# Patient Record
Sex: Female | Born: 1950 | Race: White | Hispanic: No | Smoking: Never smoker
Health system: Southern US, Community
[De-identification: ages and names within clinical notes are randomized; demographics above are authoritative.]

## PROBLEM LIST (undated history)

## (undated) DIAGNOSIS — F32A Depression, unspecified: Secondary | ICD-10-CM

## (undated) DIAGNOSIS — Z9889 Other specified postprocedural states: Secondary | ICD-10-CM

## (undated) DIAGNOSIS — M1990 Unspecified osteoarthritis, unspecified site: Secondary | ICD-10-CM

## (undated) DIAGNOSIS — T7840XA Allergy, unspecified, initial encounter: Secondary | ICD-10-CM

## (undated) DIAGNOSIS — F419 Anxiety disorder, unspecified: Secondary | ICD-10-CM

## (undated) DIAGNOSIS — K219 Gastro-esophageal reflux disease without esophagitis: Secondary | ICD-10-CM

## (undated) DIAGNOSIS — F329 Major depressive disorder, single episode, unspecified: Secondary | ICD-10-CM

## (undated) DIAGNOSIS — E785 Hyperlipidemia, unspecified: Secondary | ICD-10-CM

## (undated) DIAGNOSIS — I10 Essential (primary) hypertension: Secondary | ICD-10-CM

## (undated) DIAGNOSIS — K5730 Diverticulosis of large intestine without perforation or abscess without bleeding: Secondary | ICD-10-CM

## (undated) HISTORY — PX: COLONOSCOPY: SHX174

## (undated) HISTORY — DX: Hyperlipidemia, unspecified: E78.5

## (undated) HISTORY — DX: Gastro-esophageal reflux disease without esophagitis: K21.9

## (undated) HISTORY — DX: Essential (primary) hypertension: I10

## (undated) HISTORY — DX: Depression, unspecified: F32.A

## (undated) HISTORY — DX: Major depressive disorder, single episode, unspecified: F32.9

## (undated) HISTORY — DX: Diverticulosis of large intestine without perforation or abscess without bleeding: K57.30

## (undated) HISTORY — DX: Allergy, unspecified, initial encounter: T78.40XA

## (undated) HISTORY — PX: BREAST SURGERY: SHX581

## (undated) HISTORY — DX: Other specified postprocedural states: Z98.890

## (undated) HISTORY — DX: Unspecified osteoarthritis, unspecified site: M19.90

---

## 2003-11-11 ENCOUNTER — Ambulatory Visit: Payer: Self-pay | Admitting: Obstetrics and Gynecology

## 2009-01-30 LAB — HM COLONOSCOPY: HM Colonoscopy: NORMAL

## 2010-10-22 ENCOUNTER — Ambulatory Visit (INDEPENDENT_AMBULATORY_CARE_PROVIDER_SITE_OTHER): Payer: BC Managed Care – PPO | Admitting: Internal Medicine

## 2010-10-22 ENCOUNTER — Encounter: Payer: Self-pay | Admitting: Internal Medicine

## 2010-10-22 VITALS — BP 150/92 | HR 100 | Temp 98.7°F | Resp 18 | Ht 63.0 in | Wt 229.8 lb

## 2010-10-22 DIAGNOSIS — J320 Chronic maxillary sinusitis: Secondary | ICD-10-CM

## 2010-10-22 DIAGNOSIS — K219 Gastro-esophageal reflux disease without esophagitis: Secondary | ICD-10-CM | POA: Insufficient documentation

## 2010-10-22 DIAGNOSIS — Z9889 Other specified postprocedural states: Secondary | ICD-10-CM | POA: Insufficient documentation

## 2010-10-22 DIAGNOSIS — K5730 Diverticulosis of large intestine without perforation or abscess without bleeding: Secondary | ICD-10-CM | POA: Insufficient documentation

## 2010-10-22 DIAGNOSIS — F324 Major depressive disorder, single episode, in partial remission: Secondary | ICD-10-CM | POA: Insufficient documentation

## 2010-10-22 DIAGNOSIS — F329 Major depressive disorder, single episode, unspecified: Secondary | ICD-10-CM

## 2010-10-22 DIAGNOSIS — I10 Essential (primary) hypertension: Secondary | ICD-10-CM | POA: Insufficient documentation

## 2010-10-22 DIAGNOSIS — E669 Obesity, unspecified: Secondary | ICD-10-CM

## 2010-10-22 LAB — LDL CHOLESTEROL, DIRECT: Direct LDL: 174 mg/dL

## 2010-10-22 LAB — COMPREHENSIVE METABOLIC PANEL
Alkaline Phosphatase: 64 U/L (ref 39–117)
BUN: 18 mg/dL (ref 6–23)
CO2: 28 mEq/L (ref 19–32)
Creatinine, Ser: 1.2 mg/dL (ref 0.4–1.2)
GFR: 50.8 mL/min — ABNORMAL LOW (ref >60.00)
Glucose, Bld: 105 mg/dL — ABNORMAL HIGH (ref 70–99)
Sodium: 136 mEq/L (ref 135–145)
Total Bilirubin: 0.8 mg/dL (ref 0.3–1.2)
Total Protein: 7.9 g/dL (ref 6.0–8.3)

## 2010-10-22 LAB — TSH: TSH: 0.45 u[IU]/mL (ref 0.35–5.50)

## 2010-10-22 LAB — LIPID PANEL
Cholesterol: 236 mg/dL — ABNORMAL HIGH (ref 0–200)
HDL: 45.8 mg/dL (ref >39.00)
Triglycerides: 92 mg/dL (ref 0.0–149.0)

## 2010-10-22 MED ORDER — HYDROCHLOROTHIAZIDE 25 MG PO TABS: 25.0000 mg | ORAL_TABLET | Freq: Every day | ORAL | Status: DC

## 2010-10-22 MED ORDER — LOSARTAN POTASSIUM 50 MG PO TABS: 50.0000 mg | ORAL_TABLET | Freq: Every day | ORAL | Status: DC

## 2010-10-22 MED ORDER — CLINDAMYCIN HCL 300 MG PO CAPS: 300.0000 mg | ORAL_CAPSULE | Freq: Three times a day (TID) | ORAL | Status: AC

## 2010-10-22 MED ORDER — AZITHROMYCIN 500 MG PO TABS: ORAL_TABLET | ORAL | Status: DC

## 2010-10-22 NOTE — Progress Notes (Signed)
  Subjective:    Patient ID: Michele Garza, female    DOB: 13-Apr-1950, 60 y.o.   MRN: [id number]  HPI Michele Garza is a 60 yo female with no significant PMH who is transferring care from Dr. Quillian Quince.  She had a complicated sinus infection in August which was  treated by Urgent care with amoxicillin when she couldn't get a call back for an appointment with her PCP.  Des[pite finishing her antibioitcis she continues to have green nasal drainage, however her fevers finally resolved. She contineus to report fullness on the right side and tooth pain. Her 2nd CC complaint is obesity,  She wants to lose weight and get healthy "the old fashioned way" using diet and exercise.   Past Medical History  Diagnosis Date  . Hypertension   . History of colonoscopy     done in  dec 2011, repeat due 5 yrs due to FH  . Diverticulosis of colon     one prior episode July 2011 caused by tomato overingestion  . GERD (gastroesophageal reflux disease)   . Depression    No current outpatient prescriptions on file prior to visit.     Review of Systems  Constitutional: Negative for fever, chills and unexpected weight change.  HENT: Positive for postnasal drip and sinus pressure. Negative for hearing loss, ear pain, nosebleeds, congestion, sore throat, facial swelling, rhinorrhea, sneezing, mouth sores, trouble swallowing, neck pain, neck stiffness, voice change, tinnitus and ear discharge.   Eyes: Negative for pain, discharge, redness and visual disturbance.  Respiratory: Negative for cough, chest tightness, shortness of breath, wheezing and stridor.   Cardiovascular: Negative for chest pain, palpitations and leg swelling.  Musculoskeletal: Negative for myalgias and arthralgias.  Skin: Negative for color change and rash.  Neurological: Negative for dizziness, weakness, light-headedness and headaches.  Hematological: Negative for adenopathy.       Objective:   Physical Exam  Constitutional: She is oriented to person,  place, and time. She appears well-developed and well-nourished.  HENT:  Right Ear: Tympanic membrane is erythematous.  Nose: Mucosal edema present. Right sinus exhibits maxillary sinus tenderness.  Mouth/Throat: Oropharynx is clear and moist.  Eyes: EOM are normal. Pupils are equal, round, and reactive to light. No scleral icterus.  Neck: Normal range of motion. Neck supple. No JVD present. No thyromegaly present.  Cardiovascular: Normal rate, regular rhythm, normal heart sounds and intact distal pulses.   Pulmonary/Chest: Effort normal and breath sounds normal.  Abdominal: Soft. Bowel sounds are normal. She exhibits no mass. There is no tenderness.  Musculoskeletal: Normal range of motion. She exhibits no edema.  Lymphadenopathy:    She has no cervical adenopathy.  Neurological: She is alert and oriented to person, place, and time.  Skin: Skin is warm and dry.  Psychiatric: She has a normal mood and affect.          Assessment & Plan:

## 2010-10-22 NOTE — Patient Instructions (Addendum)
We are going to treat you for 10 days with 2 antibiotics (and we may prolong the time if not resolved)  ,  Please continue Sudafed PE  10 mg every 6 hours for congestion .  Also recommend using simply saline twice daily to flush your sinuses out.   Goal is 25 minutes of aerobic exercise 5 days per week.  Vigorous means short of breath can talk but not sing while exercising  Try the EAS protein shakes and the Atkins snack size protein bars.   Eat every 3 hours (using < 140 cal snacks) ,  5 to 6 meals daily.   We are changing lisinopril to losartan 50 mg daily  Use the HCTZ as needed for fluid retention

## 2010-10-23 ENCOUNTER — Encounter: Payer: Self-pay | Admitting: Internal Medicine

## 2010-10-25 ENCOUNTER — Telehealth: Payer: Self-pay | Admitting: Internal Medicine

## 2010-10-25 NOTE — Telephone Encounter (Signed)
Patient called and stated she thinks she had an allergic reaction to clindamycin over the weekend.  She had a rash that was itching all over her body, she did stop taking the medication and has been taking benadryl.  She also stated her sinus infection is better.  She will call back tomorrow if the rash is still there.  Patient did not report any SOB.

## 2010-10-26 NOTE — Telephone Encounter (Signed)
I have added clinda as an allergy to her chart.  Please let her know that I agree with her.

## 2011-02-04 ENCOUNTER — Ambulatory Visit: Payer: BC Managed Care – PPO | Admitting: Internal Medicine

## 2011-02-25 ENCOUNTER — Ambulatory Visit: Payer: BC Managed Care – PPO | Admitting: Internal Medicine

## 2011-03-31 LAB — HM MAMMOGRAPHY: HM Mammogram: NORMAL

## 2011-04-15 ENCOUNTER — Ambulatory Visit: Payer: BC Managed Care – PPO | Admitting: Internal Medicine

## 2011-04-20 ENCOUNTER — Ambulatory Visit: Payer: BC Managed Care – PPO | Admitting: Internal Medicine

## 2011-05-27 ENCOUNTER — Ambulatory Visit: Payer: BC Managed Care – PPO | Admitting: Internal Medicine

## 2011-10-10 ENCOUNTER — Telehealth: Payer: Self-pay | Admitting: Internal Medicine

## 2011-10-10 DIAGNOSIS — I10 Essential (primary) hypertension: Secondary | ICD-10-CM

## 2011-10-10 MED ORDER — LOSARTAN POTASSIUM 50 MG PO TABS: 50.0000 mg | ORAL_TABLET | Freq: Every day | ORAL | Status: DC

## 2011-10-10 MED ORDER — HYDROCHLOROTHIAZIDE 25 MG PO TABS: 25.0000 mg | ORAL_TABLET | Freq: Every day | ORAL | Status: DC

## 2011-10-10 NOTE — Telephone Encounter (Signed)
Losartan potassium 50 mg tab 30 each prescribed refill 11 date written 10/5/1 Take 1 tablet by mouth daily Hydrochlorothiazide 25 mg tab 90 each prescribed 3 date written 10/22/10 Take 1 tablet (25 mg total) by mouth daily

## 2012-01-31 ENCOUNTER — Ambulatory Visit (INDEPENDENT_AMBULATORY_CARE_PROVIDER_SITE_OTHER): Payer: BC Managed Care – PPO | Admitting: Internal Medicine

## 2012-01-31 ENCOUNTER — Encounter: Payer: Self-pay | Admitting: Internal Medicine

## 2012-01-31 VITALS — BP 136/86 | HR 97 | Temp 98.4°F | Resp 16 | Ht 63.0 in | Wt 223.2 lb

## 2012-01-31 DIAGNOSIS — E669 Obesity, unspecified: Secondary | ICD-10-CM

## 2012-01-31 DIAGNOSIS — I10 Essential (primary) hypertension: Secondary | ICD-10-CM

## 2012-01-31 DIAGNOSIS — Z8619 Personal history of other infectious and parasitic diseases: Secondary | ICD-10-CM

## 2012-01-31 LAB — COMPREHENSIVE METABOLIC PANEL
BUN: 18 mg/dL (ref 6–23)
CO2: 29 mEq/L (ref 19–32)
Creatinine, Ser: 1.1 mg/dL (ref 0.4–1.2)
GFR: 52.67 mL/min — ABNORMAL LOW (ref >60.00)
Glucose, Bld: 110 mg/dL — ABNORMAL HIGH (ref 70–99)
Total Bilirubin: 0.9 mg/dL (ref 0.3–1.2)

## 2012-01-31 LAB — LIPID PANEL
Cholesterol: 218 mg/dL — ABNORMAL HIGH (ref 0–200)
HDL: 46.8 mg/dL (ref >39.00)
VLDL: 12.6 mg/dL (ref 0.0–40.0)

## 2012-01-31 LAB — TSH: TSH: 0.75 u[IU]/mL (ref 0.35–5.50)

## 2012-01-31 MED ORDER — HYDROCHLOROTHIAZIDE 25 MG PO TABS: 25.0000 mg | ORAL_TABLET | Freq: Every day | ORAL | Status: DC

## 2012-01-31 MED ORDER — ACYCLOVIR 400 MG PO TABS: 400.0000 mg | ORAL_TABLET | ORAL | Status: DC

## 2012-01-31 MED ORDER — LOSARTAN POTASSIUM 50 MG PO TABS: 50.0000 mg | ORAL_TABLET | Freq: Every day | ORAL | Status: DC

## 2012-01-31 NOTE — Assessment & Plan Note (Signed)
I have addressed  BMI and recommended a low glycemic index diet utilizing smaller more frequent meals to increase metabolism.  I have also recommended that patient start exercising with a goal of 30 minutes of aerobic exercise a minimum of 5 days per week. Screening for lipid disorders, thyroid and diabetes to be done today.   

## 2012-01-31 NOTE — Assessment & Plan Note (Signed)
Resume losartan and hctz at prior doses,  Checking renal function today.

## 2012-01-31 NOTE — Patient Instructions (Addendum)
Goal bp is < 130/80  Call in a week or two with reading.    This is  my version of a  "Low GI"  Diet:  All of the foods can be found at grocery stores and in bulk at [employer].  The Atkins protein bars and shakes are available in more varieties at [employer], [employer] and [employer].     7 AM Breakfast:  Low carbohydrate Protein  Shakes (I recommend the EAS AdvantEdge "Carb Control" shakes  Or the low carb shakes by Atkins.   Both are available everywhere:  In  cases at [employer]Js  Or in 4 packs at grocery stores and pharmacies  2.5 carbs  (Alternative is  a toasted Arnold's Sandwhich Thin w/ peanut butter, a "Bagel Thin" with cream cheese and salmon) or  a scrambled egg burrito made with a low carb tortilla .  Avoid cereal and bananas, oatmeal too unless you are cooking the old fashioned kind that takes 30-40 minutes to prepare.  the rest is overly processed, has minimal fiber, and is loaded with carbohydrates!   10 AM: Protein bar by Atkins (the snack size, under 200 cal).  There are many varieties , available widely again or in bulk in limited varieties at [employer]Js)  Other so called "protein bars" tend to be loaded with carbohydrates.  Remember, in food advertising, the word "energy" is synonymous for " carbohydrate."  Lunch: sandwich of [place], (or any lunchmeat, grilled meat or canned tuna), fresh avocado, mayonnaise  and cheese on a lower carbohydrate pita bread, flatbread, or tortilla . Ok to use regular mayonnaise. The bread is the only source or carbohydrate that can be decreased (Joseph's makes a pita bread and a flat bread that are 50 cal and 4 net carbs ; Toufayan makes a low carb flatbread that's 100 cal and 9 net carbs  and  Mission makes a low carb whole wheat tortilla  That is 210 cal and 6 net carbs)  3 PM:  Mid day :  Another protein bar,  Or a  cheese stick (100 cal, 0 carbs),  Or 1 ounce of  almonds, walnuts, pistachios, pecans, peanuts,  Macadamia nuts. Or a Dannon light n Fit greek yogurt, 80  cal 8 net carbs . Avoid "granola"; the dried cranberries and raisins are loaded with carbohydrates. Mixed nuts ok if no raisins or cranberries or dried fruit.      6 PM  Dinner:  "mean and green:"  Meat/chicken/fish or a high protein legume; , with a green salad, and a low GI  Veggie (broccoli, cauliflower, green beans, spinach, brussel sprouts. Lima beans) : Avoid "Low fat dressings, as well as Reyne Dumas and [address]! They are loaded with sugar! Instead use ranch, vinagrette,  Blue cheese, etc.  There is a low carb pasta by Dreamfield's available at [facility] that is acceptable and tastes great. Try Micheal Angelo's chicken piccata over low carb pasta. The chicken dish is 0 carbs, and can be found in frozen section at BJs and [employer]. Also try [employer] "Carnitas" (pulled pork, no sauce,  0 carbs) and his pot roast.   both are in the refrigerated section at BJs .  You can also add the low carb pasta by Dreamfield's available at every local grocery store.   9 PM snack : Breyer's "low carb" fudgsicle or  ice cream bar (Carb Smart line), or  Weight Watcher's ice cream bar , or another "no sugar added" ice cream;a serving  of fresh berries/cherries with whipped cream (Avoid bananas, pineapple, grapes  and watermelon on a regular basis because they are high in sugar)   Remember that snack Substitutions should be less than 15 to 20 carbs  Per serving. Remember to subtract fiber grams and sugar alcohols to get the "net carbs."

## 2012-01-31 NOTE — Progress Notes (Signed)
Patient ID: Michele Garza, female   DOB: 21-Feb-1950, 62 y.o.   MRN: [id number] Patient Active Problem List  Diagnosis  . Hypertension  . History of colonoscopy  . Diverticulosis of colon  . GERD (gastroesophageal reflux disease)  . Depression  . History of herpes zoster  . Obesity (BMI 30-39.9)    Subjective:  CC:   Chief Complaint  Patient presents with  . Annual Exam    HPI:   Michele Garza a 62 y.o. female who presents for follow up on chronic medical problems, including hypertension and obesity.  She has been lost to follow up oct 2012 las visit. Has no new complaints.  Has lost only 6 lbs in the past year .  Has not started a formal exercise program yet.    Past Medical History  Diagnosis Date  . Hypertension   . History of colonoscopy     done in  dec 2011, repeat due 5 yrs due to FH  . Diverticulosis of colon     one prior episode July 2011 caused by tomato overingestion  . GERD (gastroesophageal reflux disease)   . Depression     Past Surgical History  Procedure Date  . Breast surgery     sterotactic breaswt biopsy: atypical cells,  continued diagnostic mammograms    The following portions of the patient's history were reviewed and updated as appropriate: Allergies, current medications, and problem list.    Review of Systems:   Patient denies headache, fevers, malaise, unintentional weight loss, skin rash, eye pain, sinus congestion and sinus pain, sore throat, dysphagia,  hemoptysis , cough, dyspnea, wheezing, chest pain, palpitations, orthopnea, edema, abdominal pain, nausea, melena, diarrhea, constipation, flank pain, dysuria, hematuria, urinary  Frequency, nocturia, numbness, tingling, seizures,  Focal weakness, Loss of consciousness,  Tremor, insomnia, depression, anxiety, and suicidal ideation.        History   Social History  . Marital Status: Divorced    Spouse Name: N/A    Number of Children: N/A  . Years of Education: N/A    Occupational History  . 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 Smoking status: Never Smoker   . Smokeless tobacco: Never Used  . Alcohol Use: 0.6 oz/week    1 Shots of liquor per week     Comment: occasional  . Drug Use: No  . Sexually Active: Not on file   Other Topics Concern  . Not on file   Social History Narrative  . No narrative on file    Objective:  BP 136/86  Pulse 97  Temp 98.4 F (36.9 C) (Oral)  Resp 16  Ht 5\' 3"  (1.6 m)  Wt 223 lb 4 oz (101.266 kg)  BMI 39.55 kg/m2  SpO2 97%  General appearance: alert, cooperative and appears stated age Ears: normal TM's and external ear canals both ears Throat: lips, mucosa, and tongue normal; teeth and gums normal Neck: no adenopathy, no carotid bruit, supple, symmetrical, trachea midline and thyroid not enlarged, symmetric, no tenderness/mass/nodules Back: symmetric, no curvature. ROM normal. No CVA tenderness. Lungs: clear to auscultation bilaterally Heart: regular rate and rhythm, S1, S2 normal, no murmur, click, rub or gallop Abdomen: soft, non-tender; bowel sounds normal; no masses,  no organomegaly Pulses: 2+ and symmetric Skin: Skin color, texture, turgor normal. No rashes or lesions Lymph nodes: Cervical, supraclavicular, and axillary nodes normal.  Assessment and Plan:  Hypertension Resume losartan and hctz at  prior doses,  Checking renal function today.    Obesity (BMI 30-39.9) I have addressed  BMI and recommended a low glycemic index diet utilizing smaller more frequent meals to increase metabolism.  I have also recommended that patient start exercising with a goal of 30 minutes of aerobic exercise a minimum of 5 days per week. Screening for lipid disorders, thyroid and diabetes to be done today.     Updated Medication List Outpatient Encounter Prescriptions as of 01/31/2012  Medication Sig Dispense Refill  . aspirin 81 MG tablet Take 81 mg by  mouth daily.        . calcium citrate-vitamin D (CITRACAL+D) 315-200 MG-UNIT per tablet Take 1 tablet by mouth daily.        . hydrochlorothiazide (HYDRODIURIL) 25 MG tablet Take 1 tablet (25 mg total) by mouth daily.  30 tablet  0  . losartan (COZAAR) 50 MG tablet Take 1 tablet (50 mg total) by mouth daily.  30 tablet  0  . Multiple Vitamin (MULTIVITAMIN) tablet Take 1 tablet by mouth daily.        . [DISCONTINUED] hydrochlorothiazide (HYDRODIURIL) 25 MG tablet Take 1 tablet (25 mg total) by mouth daily.  60 tablet  0  . [DISCONTINUED] lisinopril (PRINIVIL,ZESTRIL) 10 MG tablet Take 10 mg by mouth daily.        . [DISCONTINUED] losartan (COZAAR) 50 MG tablet Take 1 tablet (50 mg total) by mouth daily.  60 tablet  0  . acyclovir (ZOVIRAX) 400 MG tablet Take 1 tablet (400 mg total) by mouth every 4 (four) hours while awake.  28 tablet  3  . buPROPion (WELLBUTRIN SR) 150 MG 12 hr tablet Take 150 mg by mouth daily.        . [DISCONTINUED] azithromycin (ZITHROMAX) 500 MG tablet Take 1 tablet daily for 10 days  10 tablet  0     Orders Placed This Encounter  Procedures  . HM MAMMOGRAPHY  . HM PAP SMEAR  . TSH  . Comprehensive metabolic panel  . Lipid panel  . LDL cholesterol, direct  . HM COLONOSCOPY    Return in about 6 months (around 07/30/2012).

## 2012-02-24 ENCOUNTER — Encounter: Payer: Self-pay | Admitting: Internal Medicine

## 2012-02-26 ENCOUNTER — Other Ambulatory Visit: Payer: Self-pay | Admitting: Internal Medicine

## 2012-02-26 DIAGNOSIS — I10 Essential (primary) hypertension: Secondary | ICD-10-CM

## 2012-02-26 MED ORDER — HYDROCHLOROTHIAZIDE 25 MG PO TABS: 25.0000 mg | ORAL_TABLET | Freq: Every day | ORAL | Status: DC

## 2012-02-26 MED ORDER — LOSARTAN POTASSIUM 50 MG PO TABS: 50.0000 mg | ORAL_TABLET | Freq: Every day | ORAL | Status: DC

## 2012-03-03 ENCOUNTER — Other Ambulatory Visit: Payer: Self-pay

## 2012-06-08 ENCOUNTER — Encounter: Payer: Self-pay | Admitting: Internal Medicine

## 2012-06-08 ENCOUNTER — Ambulatory Visit (INDEPENDENT_AMBULATORY_CARE_PROVIDER_SITE_OTHER): Payer: BC Managed Care – PPO | Admitting: Internal Medicine

## 2012-06-08 VITALS — BP 142/88 | HR 115 | Temp 98.1°F | Resp 14 | Wt 221.5 lb

## 2012-06-08 DIAGNOSIS — R000 Tachycardia, unspecified: Secondary | ICD-10-CM

## 2012-06-08 DIAGNOSIS — R002 Palpitations: Secondary | ICD-10-CM

## 2012-06-08 DIAGNOSIS — M722 Plantar fascial fibromatosis: Secondary | ICD-10-CM

## 2012-06-08 DIAGNOSIS — F411 Generalized anxiety disorder: Secondary | ICD-10-CM

## 2012-06-08 LAB — MAGNESIUM: Magnesium: 1.7 mg/dL (ref 1.5–2.5)

## 2012-06-08 LAB — COMPREHENSIVE METABOLIC PANEL
ALT: 29 U/L (ref 0–35)
AST: 27 U/L (ref 0–37)
CO2: 33 mEq/L — ABNORMAL HIGH (ref 19–32)
Calcium: 9.8 mg/dL (ref 8.4–10.5)
Chloride: 97 mEq/L (ref 96–112)
Creatinine, Ser: 1 mg/dL (ref 0.4–1.2)
GFR: 62.85 mL/min (ref >60.00)
Potassium: 3.5 mEq/L (ref 3.5–5.1)
Sodium: 139 mEq/L (ref 135–145)
Total Protein: 7.3 g/dL (ref 6.0–8.3)

## 2012-06-08 MED ORDER — FUROSEMIDE 20 MG PO TABS: 20.0000 mg | ORAL_TABLET | Freq: Every day | ORAL | Status: DC

## 2012-06-08 MED ORDER — METOPROLOL SUCCINATE ER 25 MG PO TB24: 25.0000 mg | ORAL_TABLET | Freq: Every day | ORAL | Status: DC

## 2012-06-08 MED ORDER — TRAMADOL HCL 50 MG PO TABS: 50.0000 mg | ORAL_TABLET | Freq: Three times a day (TID) | ORAL | Status: DC | PRN

## 2012-06-08 MED ORDER — ALPRAZOLAM 0.25 MG PO TABS: 0.2500 mg | ORAL_TABLET | Freq: Two times a day (BID) | ORAL | Status: DC | PRN

## 2012-06-08 NOTE — Progress Notes (Signed)
Patient ID: Michele Garza, female   DOB: 12-20-1950, 62 y.o.   MRN: [id number]  Patient Active Problem List   Diagnosis Date Noted  . Anxiety state, unspecified 06/11/2012  . Plantar fasciitis, bilateral 06/11/2012  . Sinus tachycardia by electrocardiogram 06/08/2012  . History of herpes zoster 01/31/2012  . Obesity (BMI 30-39.9) 01/31/2012  . Hypertension   . History of colonoscopy   . Diverticulosis of colon   . GERD (gastroesophageal reflux disease)   . Depression     Subjective:  CC:   Chief Complaint  Patient presents with  . Follow-up    from 1/14 : For non productive cough, irritation to throat. Bronchial spasms, ringing in ears ,    HPI:   Michele Garza a 62 y.o. female who presents with several acute issues:   Has  Been having episodes of extreme anxiety since Easter,  Actually called the RN one weekend and talked to her about it (not documented) . Lots of stress in life, but handling it pretty well, she thinks,  Has noticed increased pulse while using the elliptiglider (up to 150 bpm) but not short of breath or having chest pain during exercise or at rest.    Plantar fasciitis is limiting her ability to exercise due to heel pain  . She  Used to have orthotics,  vecause she has a history of high arches since childhood.   Past Medical History  Diagnosis Date  . Hypertension   . History of colonoscopy     done in  dec 2011, repeat due 5 yrs due to FH  . Diverticulosis of colon     one prior episode July 2011 caused by tomato overingestion  . GERD (gastroesophageal reflux disease)   . Depression     Past Surgical History  Procedure Laterality Date  . Breast surgery      sterotactic breaswt biopsy: atypical cells,  continued diagnostic mammograms       The following portions of the patient's history were reviewed and updated as appropriate: Allergies, current medications, and problem list.    Review of Systems:   Patient denies headache, fevers,  malaise, unintentional weight loss, skin rash, eye pain, sinus congestion and sinus pain, sore throat, dysphagia,  hemoptysis , cough, dyspnea, wheezing, chest pain, palpitations, orthopnea, edema, abdominal pain, nausea, melena, diarrhea, constipation, flank pain, dysuria, hematuria, urinary  Frequency, nocturia, numbness, tingling, seizures,  Focal weakness, Loss of consciousness,  Tremor, insomnia, depression,and suicidal ideation.       History   Social History  . Marital Status: Divorced    Spouse Name: N/A    Number of Children: N/A  . 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 Smoking status: Never Smoker   . Smokeless tobacco: Never Used  . Alcohol Use: 0.6 oz/week    1 Shots of liquor per week     Comment: occasional  . Drug Use: No  . Sexually Active: Not on file   Other Topics Concern  . Not on file   Social History Narrative  . No narrative on file    Objective:  BP 142/88  Pulse 115  Temp(Src) 98.1 F (36.7 C) (Oral)  Resp 14  Wt 221 lb 8 oz (100.472 kg)  BMI 39.25 kg/m2  SpO2 98%  General appearance: alert, cooperative and appears stated age Ears: normal TM's and external ear canals  both ears Throat: lips, mucosa, and tongue normal; teeth and gums normal Neck: no adenopathy, no carotid bruit, supple, symmetrical, trachea midline and thyroid not enlarged, symmetric, no tenderness/mass/nodules Back: symmetric, no curvature. ROM normal. No CVA tenderness. Lungs: clear to auscultation bilaterally Heart: tachycardic but regular rhythm, S1, S2 normal, no murmur, click, rub or gallop Abdomen: soft, non-tender; bowel sounds normal; no masses,  no organomegaly Pulses: 2+ and symmetric Skin: Skin color, texture, turgor normal. No rashes or lesions Lymph nodes: Cervical, supraclavicular, and axillary nodes normal. Ext: no cyanosis or edema  Assessment and  Plan:  Sinus tachycardia by electrocardiogram Her episodes of extreme anxiety may be due to palpitations..  Sinus tach on EKG today.,  Thyroid funciton adn electrlytes normal.  Starting low dose Toprol.   Anxiety state, unspecified Secondary to stressful life events and pressures at work.  History of depression.,  Trial of low dosealprazolam, prn   Plantar fasciitis, bilateral Complicated by obesity and high arches.  Referral to [facility] for eval and treatment   Updated Medication List Outpatient Encounter Prescriptions as of 06/08/2012  Medication Sig Dispense Refill  . acyclovir (ZOVIRAX) 400 MG tablet Take 1 tablet (400 mg total) by mouth every 4 (four) hours while awake.  28 tablet  3  . aspirin 81 MG tablet Take 81 mg by mouth daily.        . calcium citrate-vitamin D (CITRACAL+D) 315-200 MG-UNIT per tablet Take 1 tablet by mouth daily.        . hydrochlorothiazide (HYDRODIURIL) 25 MG tablet Take 1 tablet (25 mg total) by mouth daily.  90 tablet  1  . losartan (COZAAR) 50 MG tablet Take 1 tablet (50 mg total) by mouth daily.  90 tablet  1  . Multiple Vitamin (MULTIVITAMIN) tablet Take 1 tablet by mouth daily.        Marland Kitchen ALPRAZolam (XANAX) 0.25 MG tablet Take 1 tablet (0.25 mg total) by mouth 2 (two) times daily as needed for sleep.  60 tablet  0  . furosemide (LASIX) 20 MG tablet Take 1 tablet (20 mg total) by mouth daily.  30 tablet  3  . metoprolol succinate (TOPROL-XL) 25 MG 24 hr tablet Take 1 tablet (25 mg total) by mouth daily.  90 tablet  3  . traMADol (ULTRAM) 50 MG tablet Take 1 tablet (50 mg total) by mouth every 8 (eight) hours as needed for pain.  60 tablet  3  . [DISCONTINUED] buPROPion (WELLBUTRIN SR) 150 MG 12 hr tablet Take 150 mg by mouth daily.         No facility-administered encounter medications on file as of 06/08/2012.     Orders Placed This Encounter  Procedures  . TSH + free T4  . Magnesium  . Comprehensive metabolic panel  . Ambulatory referral to  Podiatry  . EKG 12-Lead    No Follow-up on file.

## 2012-06-08 NOTE — Patient Instructions (Addendum)
We are starting toprol for your tachycardia and rechecking your thyroid  Trial of alprazolam for anxiety   Referral to Dr Ether Griffins for plantar fasciitis;  You can add tramadol as needed for pain ,  To aleve or tylenol   You may use furosemide as needed for fluid retention

## 2012-06-09 LAB — TSH+FREE T4: Free T4: 1.39 ng/dL (ref 0.82–1.77)

## 2012-06-10 ENCOUNTER — Encounter: Payer: Self-pay | Admitting: Internal Medicine

## 2012-06-11 ENCOUNTER — Encounter: Payer: Self-pay | Admitting: Internal Medicine

## 2012-06-11 DIAGNOSIS — M722 Plantar fascial fibromatosis: Secondary | ICD-10-CM | POA: Insufficient documentation

## 2012-06-11 DIAGNOSIS — F411 Generalized anxiety disorder: Secondary | ICD-10-CM | POA: Insufficient documentation

## 2012-06-11 NOTE — Assessment & Plan Note (Signed)
Her episodes of extreme anxiety may be due to palpitations..  Sinus tach on EKG today.,  Thyroid funciton adn electrlytes normal.  Starting low dose Toprol.

## 2012-06-11 NOTE — Assessment & Plan Note (Signed)
Complicated by obesity and high arches.  Referral to [facility] for eval and treatment

## 2012-06-11 NOTE — Assessment & Plan Note (Signed)
Secondary to stressful life events and pressures at work.  History of depression.,  Trial of low dosealprazolam, prn

## 2012-06-29 ENCOUNTER — Encounter: Payer: Self-pay | Admitting: Internal Medicine

## 2012-06-29 ENCOUNTER — Ambulatory Visit (INDEPENDENT_AMBULATORY_CARE_PROVIDER_SITE_OTHER): Payer: BC Managed Care – PPO | Admitting: Internal Medicine

## 2012-06-29 VITALS — BP 124/78 | HR 83 | Temp 98.3°F | Resp 14

## 2012-06-29 DIAGNOSIS — I10 Essential (primary) hypertension: Secondary | ICD-10-CM

## 2012-06-29 DIAGNOSIS — E876 Hypokalemia: Secondary | ICD-10-CM

## 2012-06-29 DIAGNOSIS — Z79899 Other long term (current) drug therapy: Secondary | ICD-10-CM

## 2012-06-29 DIAGNOSIS — K219 Gastro-esophageal reflux disease without esophagitis: Secondary | ICD-10-CM

## 2012-06-29 DIAGNOSIS — R000 Tachycardia, unspecified: Secondary | ICD-10-CM

## 2012-06-29 DIAGNOSIS — F411 Generalized anxiety disorder: Secondary | ICD-10-CM

## 2012-06-29 LAB — BASIC METABOLIC PANEL
BUN: 24 mg/dL — ABNORMAL HIGH (ref 6–23)
Calcium: 10.7 mg/dL — ABNORMAL HIGH (ref 8.4–10.5)
Chloride: 95 mEq/L — ABNORMAL LOW (ref 96–112)
Creatinine, Ser: 1.2 mg/dL (ref 0.4–1.2)

## 2012-06-29 NOTE — Progress Notes (Signed)
Patient ID: Michele Garza, female   DOB: 14-Feb-1950, 62 y.o.   MRN: [id number]   Patient Active Problem List   Diagnosis Date Noted  . Anxiety state, unspecified 06/11/2012  . Plantar fasciitis, bilateral 06/11/2012  . Sinus tachycardia by electrocardiogram 06/08/2012  . History of herpes zoster 01/31/2012  . Obesity (BMI 30-39.9) 01/31/2012  . Hypertension   . History of colonoscopy   . Diverticulosis of colon   . GERD (gastroesophageal reflux disease)   . Depression     Subjective:  CC:   Chief Complaint  Patient presents with  . Follow-up    For blood pressure    HPI:   Michele Garza a 62 y.o. female who presents Follow up on multiple previously raised problems.   1)  initiation of toprol xl and alprazolam for palpitations and anxiety with panic attacks. She had been having episodes of extreme anxiety since Easter,  Actually called the RN one weekend and talked to her about it (not documented) . Lots of stress in life, but handling it pretty well, she thinks,  Has noticed increased pulse while using the elliptiglider (up to 150 bpm) but not short of breath or having chest pain during exercise or at rest. She has been tolerating the metoprolol well,  But the alprazolam made her too sleepy  Feels generally better with just the metprolol taken daily.   GERD:  She has been taking an OTC PPI for relief of GERD with good results.   3) Foot pain:  Plantar fasciitis is limiting her ability to exercise due to heel pain  . She  Used to have orthotics,  because she has a history of high arches since childhood. She has an appt sdcheduled with Gwyneth Revels in the near future.  She is using stretching exercises.  Tramadol not  really helping    Past Medical History  Diagnosis Date  . Hypertension   . History of colonoscopy     done in  dec 2011, repeat due 5 yrs due to FH  . Diverticulosis of colon     one prior episode July 2011 caused by tomato overingestion  . GERD  (gastroesophageal reflux disease)   . Depression     Past Surgical History  Procedure Laterality Date  . Breast surgery      sterotactic breaswt biopsy: atypical cells,  continued diagnostic mammograms       The following portions of the patient's history were reviewed and updated as appropriate: Allergies, current medications, and problem list.    Review of Systems:   Patient denies headache, fevers, malaise, unintentional weight loss, skin rash, eye pain, sinus congestion and sinus pain, sore throat, dysphagia,  hemoptysis , cough, dyspnea, wheezing, chest pain, palpitations, orthopnea, edema, abdominal pain, nausea, melena, diarrhea, constipation, flank pain, dysuria, hematuria, urinary  Frequency, nocturia, numbness, tingling, seizures,  Focal weakness, Loss of consciousness,  Tremor, insomnia, depression, anxiety, and suicidal ideation.     History   Social History  . Marital Status: Divorced    Spouse Name: N/A    Number of Children: N/A  . Years of Education: N/A   Occupational History  . 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 Smoking status: Never Smoker   . Smokeless tobacco: Never Used  . Alcohol Use: 0.6 oz/week    1 Shots of liquor per week     Comment: occasional  . Drug Use: No  .  Sexually Active: Not on file   Other Topics Concern  . Not on file   Social History Narrative  . No narrative on file    Objective:  BP 124/78  Pulse 83  Temp(Src) 98.3 F (36.8 C) (Oral)  Resp 14  SpO2 97%  General appearance: alert, cooperative and appears stated age Ears: normal TM's and external ear canals both ears Throat: lips, mucosa, and tongue normal; teeth and gums normal Neck: no adenopathy, no carotid bruit, supple, symmetrical, trachea midline and thyroid not enlarged, symmetric, no tenderness/mass/nodules Back: symmetric, no curvature. ROM normal. No CVA tenderness. Lungs: clear to auscultation  bilaterally Heart: regular rate and rhythm, S1, S2 normal, no murmur, click, rub or gallop Abdomen: soft, non-tender; bowel sounds normal; no masses,  no organomegaly Pulses: 2+ and symmetric Skin: Skin color, texture, turgor normal. No rashes or lesions Lymph nodes: Cervical, supraclavicular, and axillary nodes normal.  Assessment and Plan:  Hypertension Well controlled on current regimen. Renal function stable, no changes today.  GERD (gastroesophageal reflux disease) Well controlled on current daily PPI.   No changes today .  Sinus tachycardia by electrocardiogram imporved with low dose Toprol.  Thyroid function normal.  Likely due to deconditioning.   Anxiety state, unspecified Improved,  Not tolerating alprazolam    Updated Medication List Outpatient Encounter Prescriptions as of 06/29/2012  Medication Sig Dispense Refill  . acyclovir (ZOVIRAX) 400 MG tablet Take 1 tablet (400 mg total) by mouth every 4 (four) hours while awake.  28 tablet  3  . aspirin 81 MG tablet Take 81 mg by mouth daily.        . calcium citrate-vitamin D (CITRACAL+D) 315-200 MG-UNIT per tablet Take 1 tablet by mouth daily.        . fish oil-omega-3 fatty acids 1000 MG capsule Take 2 g by mouth daily.      . furosemide (LASIX) 20 MG tablet Take 1 tablet (20 mg total) by mouth daily.  30 tablet  3  . hydrochlorothiazide (HYDRODIURIL) 25 MG tablet Take 1 tablet (25 mg total) by mouth daily.  90 tablet  1  . losartan (COZAAR) 50 MG tablet Take 1 tablet (50 mg total) by mouth daily.  90 tablet  1  . metoprolol succinate (TOPROL-XL) 25 MG 24 hr tablet Take 1 tablet (25 mg total) by mouth daily.  90 tablet  3  . Multiple Vitamin (MULTIVITAMIN) tablet Take 1 tablet by mouth daily.        . traMADol (ULTRAM) 50 MG tablet Take 1 tablet (50 mg total) by mouth every 8 (eight) hours as needed for pain.  60 tablet  3  . [DISCONTINUED] ALPRAZolam (XANAX) 0.25 MG tablet Take 1 tablet (0.25 mg total) by mouth 2 (two)  times daily as needed for sleep.  60 tablet  0   No facility-administered encounter medications on file as of 06/29/2012.     Orders Placed This Encounter  Procedures  . Basic metabolic panel    No Follow-up on file.

## 2012-07-01 ENCOUNTER — Encounter: Payer: Self-pay | Admitting: Internal Medicine

## 2012-07-01 NOTE — Assessment & Plan Note (Signed)
Well controlled on current regimen. Renal function stable, no changes today. 

## 2012-07-01 NOTE — Assessment & Plan Note (Signed)
Well controlled on current daily PPI.   No changes today .

## 2012-07-01 NOTE — Assessment & Plan Note (Signed)
imporved with low dose Toprol.  Thyroid function normal.  Likely due to deconditioning.

## 2012-07-01 NOTE — Assessment & Plan Note (Signed)
Improved,  Not tolerating alprazolam

## 2012-07-01 NOTE — Addendum Note (Signed)
Addended by: Sherlene Shams on: 07/01/2012 10:22 PM   Modules accepted: Orders

## 2012-07-02 ENCOUNTER — Encounter: Payer: Self-pay | Admitting: Internal Medicine

## 2012-07-03 ENCOUNTER — Encounter: Payer: Self-pay | Admitting: Internal Medicine

## 2012-07-10 ENCOUNTER — Other Ambulatory Visit (INDEPENDENT_AMBULATORY_CARE_PROVIDER_SITE_OTHER): Payer: BC Managed Care – PPO

## 2012-07-10 DIAGNOSIS — E876 Hypokalemia: Secondary | ICD-10-CM

## 2012-07-10 LAB — BASIC METABOLIC PANEL
BUN: 18 mg/dL (ref 6–23)
CO2: 28 mEq/L (ref 19–32)
Calcium: 9.7 mg/dL (ref 8.4–10.5)
Chloride: 107 mEq/L (ref 96–112)
Creatinine, Ser: 1 mg/dL (ref 0.4–1.2)
Glucose, Bld: 114 mg/dL — ABNORMAL HIGH (ref 70–99)

## 2012-07-11 ENCOUNTER — Encounter: Payer: Self-pay | Admitting: Internal Medicine

## 2012-07-11 LAB — CALCIUM, IONIZED: Calcium, Ion: 1.31 mmol/L (ref 1.12–1.32)

## 2012-07-25 ENCOUNTER — Telehealth: Payer: Self-pay | Admitting: *Deleted

## 2012-07-25 DIAGNOSIS — R739 Hyperglycemia, unspecified: Secondary | ICD-10-CM

## 2012-07-25 DIAGNOSIS — E785 Hyperlipidemia, unspecified: Secondary | ICD-10-CM

## 2012-07-25 DIAGNOSIS — Z79899 Other long term (current) drug therapy: Secondary | ICD-10-CM

## 2012-07-25 NOTE — Telephone Encounter (Signed)
Pt is coming in for labs Friday 07.11.2014, what labs and dx?

## 2012-07-27 ENCOUNTER — Other Ambulatory Visit: Payer: BC Managed Care – PPO

## 2012-08-21 ENCOUNTER — Other Ambulatory Visit: Payer: Self-pay | Admitting: Internal Medicine

## 2012-11-17 ENCOUNTER — Other Ambulatory Visit: Payer: Self-pay | Admitting: Internal Medicine

## 2012-11-19 ENCOUNTER — Encounter: Payer: Self-pay | Admitting: Internal Medicine

## 2012-11-22 ENCOUNTER — Other Ambulatory Visit: Payer: Self-pay

## 2012-12-08 ENCOUNTER — Encounter: Payer: Self-pay | Admitting: Internal Medicine

## 2012-12-11 NOTE — Telephone Encounter (Signed)
Spoke with pt, states not currently having symptoms, was just asking what the office policy was on UTIs and if she were to develop symptoms. Advised pt to call office in the future with symptoms to schedule an appointment.

## 2013-02-01 ENCOUNTER — Encounter: Payer: BC Managed Care – PPO | Admitting: Internal Medicine

## 2013-02-28 ENCOUNTER — Encounter: Payer: Self-pay | Admitting: Internal Medicine

## 2013-02-28 ENCOUNTER — Ambulatory Visit (INDEPENDENT_AMBULATORY_CARE_PROVIDER_SITE_OTHER): Payer: BC Managed Care – PPO | Admitting: Internal Medicine

## 2013-02-28 ENCOUNTER — Other Ambulatory Visit (HOSPITAL_COMMUNITY)
Admission: RE | Admit: 2013-02-28 | Discharge: 2013-02-28 | Disposition: A | Discharge status: Home / Self Care | Payer: BC Managed Care – PPO | Source: Ambulatory Visit | Attending: Internal Medicine | Admitting: Internal Medicine

## 2013-02-28 VITALS — BP 140/78 | HR 96 | Temp 98.8°F | Resp 18 | Ht 62.75 in | Wt 226.0 lb

## 2013-02-28 DIAGNOSIS — Z01419 Encounter for gynecological examination (general) (routine) without abnormal findings: Secondary | ICD-10-CM | POA: Insufficient documentation

## 2013-02-28 DIAGNOSIS — E282 Polycystic ovarian syndrome: Secondary | ICD-10-CM

## 2013-02-28 DIAGNOSIS — E669 Obesity, unspecified: Secondary | ICD-10-CM

## 2013-02-28 DIAGNOSIS — I10 Essential (primary) hypertension: Secondary | ICD-10-CM

## 2013-02-28 DIAGNOSIS — Z1151 Encounter for screening for human papillomavirus (HPV): Secondary | ICD-10-CM | POA: Insufficient documentation

## 2013-02-28 DIAGNOSIS — Z79899 Other long term (current) drug therapy: Secondary | ICD-10-CM

## 2013-02-28 DIAGNOSIS — E785 Hyperlipidemia, unspecified: Secondary | ICD-10-CM

## 2013-02-28 DIAGNOSIS — Z1239 Encounter for other screening for malignant neoplasm of breast: Secondary | ICD-10-CM

## 2013-02-28 DIAGNOSIS — Z23 Encounter for immunization: Secondary | ICD-10-CM

## 2013-02-28 DIAGNOSIS — N952 Postmenopausal atrophic vaginitis: Secondary | ICD-10-CM

## 2013-02-28 DIAGNOSIS — Z0000 Encounter for general adult medical examination without abnormal findings: Secondary | ICD-10-CM

## 2013-02-28 LAB — COMPREHENSIVE METABOLIC PANEL
ALK PHOS: 57 U/L (ref 39–117)
ALT: 23 U/L (ref 0–35)
AST: 23 U/L (ref 0–37)
Albumin: 4.3 g/dL (ref 3.5–5.2)
BUN: 19 mg/dL (ref 6–23)
CALCIUM: 10 mg/dL (ref 8.4–10.5)
CHLORIDE: 104 meq/L (ref 96–112)
CO2: 29 mEq/L (ref 19–32)
CREATININE: 1.2 mg/dL (ref 0.4–1.2)
GFR: 47.55 mL/min — ABNORMAL LOW (ref >60.00)
Glucose, Bld: 111 mg/dL — ABNORMAL HIGH (ref 70–99)
POTASSIUM: 4 meq/L (ref 3.5–5.1)
Sodium: 141 mEq/L (ref 135–145)
Total Bilirubin: 0.8 mg/dL (ref 0.3–1.2)
Total Protein: 7.7 g/dL (ref 6.0–8.3)

## 2013-02-28 LAB — LIPID PANEL
CHOL/HDL RATIO: 5
CHOLESTEROL: 227 mg/dL — AB (ref 0–200)
HDL: 49.2 mg/dL (ref >39.00)
Triglycerides: 58 mg/dL (ref 0.0–149.0)
VLDL: 11.6 mg/dL (ref 0.0–40.0)

## 2013-02-28 LAB — HEMOGLOBIN A1C: HEMOGLOBIN A1C: 5.6 % (ref 4.6–6.5)

## 2013-02-28 LAB — LDL CHOLESTEROL, DIRECT: Direct LDL: 171.2 mg/dL

## 2013-02-28 MED ORDER — LOSARTAN POTASSIUM 50 MG PO TABS: ORAL_TABLET | ORAL | Status: DC

## 2013-02-28 NOTE — Progress Notes (Signed)
Patient ID: Michele Garza, female   DOB: [birth year], 63 y.o.   MRN: [id number]  Subjective:     Michele Garza is a 63 y.o. female and is here for a comprehensive physical exam. The patient reports as follows:Marland Kitchen  History   Social History  . Marital Status: Divorced    Spouse Name: N/A    Number of Children: N/A  . Years of Education: N/A   Occupational History  . 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 Smoking status: Never Smoker   . Smokeless tobacco: Never Used  . Alcohol Use: 0.6 oz/week    1 Shots of liquor per week     Comment: occasional  . Drug Use: No  . Sexual Activity: Not on file   Other Topics Concern  . Not on file   Social History Narrative  . No narrative on file   Health Maintenance  Topic Date Due  . Zostavax  09/11/2011  . Pap Smear  01/30/2013  . Influenza Vaccine  04/16/2013 (Originally 08/17/2012)  . Mammogram  04/21/2014  . Colonoscopy  01/31/2019  . Tetanus/tdap  03/01/2023    The following portions of the patient's history were reviewed and updated as appropriate: allergies, current medications, past family history, past medical history, past social history, past surgical history and problem list.  Review of Systems A comprehensive review of systems was negative.   Objective:   General Appearance:    Alert, cooperative, no distress, appears stated age  Head:    Normocephalic, without obvious abnormality, atraumatic  Eyes:    PERRL, conjunctiva/corneas clear, EOM's intact, fundi    benign, both eyes  Ears:    Normal TM's and external ear canals, both ears  Nose:   Nares normal, septum midline, mucosa normal, no drainage    or sinus tenderness  Throat:   Lips, mucosa, and tongue normal; teeth and gums normal  Neck:   Supple, symmetrical, trachea midline, no adenopathy;    thyroid:  no enlargement/tenderness/nodules; no carotid   bruit or JVD  Back:     Symmetric, no curvature, ROM normal,  no CVA tenderness  Lungs:     Clear to auscultation bilaterally, respirations unlabored  Chest Wall:    No tenderness or deformity   Heart:    Regular rate and rhythm, S1 and S2 normal, no murmur, rub   or gallop  Breast Exam:    No tenderness, masses, or nipple abnormality  Abdomen:     Soft, non-tender, bowel sounds active all four quadrants,    no masses, no organomegaly  Genitalia:    Pelvic: cervix difficult to reach, external genitalia normal, no adnexal masses or tenderness, no cervical motion tenderness, rectovaginal septum normal, uterus normal size, shape, and consistency and vaginal vault long  without discharge  Extremities:   Extremities normal, atraumatic, no cyanosis or edema  Pulses:   2+ and symmetric all extremities  Skin:   Skin color, texture, turgor normal, no rashes or lesions  Lymph nodes:   Cervical, supraclavicular, and axillary nodes normal  Neurologic:   CNII-XII intact, normal strength, sensation and reflexes    throughout     Assessment and Plan:   Routine general medical examination at a health care facility Annual comprehensive exam was done including breast, pelvic and PAP smear. All screenings have been addressed .   Obesity (BMI 30-39.9) I have addressed  BMI and recommended wt loss of 10%  of body weigh over the next 6 months using a low glycemic index diet and regular exercise a minimum of 5 days per week. We discussed her concern about polycystic ovarian syndrome. I explained that This is a pre-menopausal diagnosis and does not cause obesity hypertriglyceridemia and diabetes but is considered to be the result of these metabolic states.   Postmenopausal atrophic vaginitis Her symptoms were brought on by sexual intercourse. She has been given samples of Premarin cream to try. We discussed the alternative being Vagifem if she is not comfortable with the amount of moisture the Premarin cream results in imparting  Hypertension Well controlled on current  regimen. Renal function stable, no changes today.  A total of 45 minutes was spent with patient more than half of which was spent in counseling, reviewing records from other prviders and coordination of care.   Updated Medication List Outpatient Encounter Prescriptions as of 02/28/2013  Medication Sig  . acyclovir (ZOVIRAX) 400 MG tablet Take 1 tablet (400 mg total) by mouth every 4 (four) hours while awake.  Marland Kitchen aspirin 81 MG tablet Take 81 mg by mouth daily.    . calcium citrate-vitamin D (CITRACAL+D) 315-200 MG-UNIT per tablet Take 1 tablet by mouth daily.    . fish oil-omega-3 fatty acids 1000 MG capsule Take 2 g by mouth daily.  . furosemide (LASIX) 20 MG tablet TAKE 1 TABLET (20 MG TOTAL) BY MOUTH DAILY.  Marland Kitchen losartan (COZAAR) 50 MG tablet TAKE 1 TABLET (50 MG TOTAL) BY MOUTH DAILY.  . metoprolol succinate (TOPROL-XL) 25 MG 24 hr tablet Take 1 tablet (25 mg total) by mouth daily.  . Multiple Vitamin (MULTIVITAMIN) tablet Take 1 tablet by mouth daily.    . [DISCONTINUED] losartan (COZAAR) 50 MG tablet TAKE 1 TABLET (50 MG TOTAL) BY MOUTH DAILY.  . hydrochlorothiazide (HYDRODIURIL) 25 MG tablet TAKE 1 TABLET (25 MG TOTAL) BY MOUTH DAILY.  . traMADol (ULTRAM) 50 MG tablet Take 1 tablet (50 mg total) by mouth every 8 (eight) hours as needed for pain.

## 2013-02-28 NOTE — Patient Instructions (Signed)
i WANT YOU TO LOSE 22 LBS OBER THE NEXT 6 MONTHS WITH A LOW GLYCEMIC INDEX DIET AND REGULAR EXERCISE  This is  MY version of a  "Low GI"  Diet:  It will still lower your blood sugars and allow you to lose 4 to 8  lbs  per month if you follow it carefully.  Your goal with exercise is a minimum of 30 minutes of aerobic exercise 5 days per week (Walking does not count once it becomes easy!)    All of the foods can be found at grocery stores and in bulk at [employer].  The Atkins protein bars and shakes are available in more varieties at Target, [employer] and [place].     7 AM Breakfast:  Choose from the following:  Low carbohydrate Protein  Shakes (I recommend the EAS AdvantEdge "Carb Control" shakes  Or the low carb shakes by Atkins.    2.5 carbs   Arnold's "Sandwhich Thin"toasted  w/ peanut butter (no jelly: about 20 net carbs  "Bagel Thin" with cream cheese and salmon: about 20 carbs   a scrambled egg/bacon/cheese burrito made with Mission's "carb balance" whole wheat tortilla  (about 10 net carbs )   Avoid cereal and bananas, oatmeal and cream of wheat and grits. They are loaded with carbohydrates!   10 AM: high protein snack  Protein bar by Atkins (the snack size, under 200 cal, usually < 6 net carbs).    A stick of cheese:  Around 1 carb,  100 cal     Dannon Light n Fit [place] Yogurt  (80 cal, 8 carbs)  Other so called "protein bars" and Greek yogurts tend to be loaded with carbohydrates.  Remember, in food advertising, the word "energy" is synonymous for " carbohydrate."  Lunch:   A Sandwich using the bread choices listed, Can use any  Eggs,  lunchmeat, grilled meat or canned tuna), avocado, regular mayo/mustard  and cheese.  A Salad using blue cheese, ranch,  Goddess or vinagrette,  No croutons or "confetti" and no "candied nuts" but regular nuts OK.   No pretzels or chips.  Pickles and miniature sweet peppers are a good low carb alternative that provide a "crunch"  The bread is the  only source of carbohydrate in a sandwich and  can be decreased by trying some of these alternatives to traditional loaf bread  Joseph's makes a pita bread and a flat bread that are 50 cal and 4 net carbs available at [place] and [place].  This can be toasted to use with hummous as well  Toufayan makes a low carb flatbread that's 100 cal and 9 net carbs available at [employer] and [employer]J's makes 2 sizes of  Low carb whole wheat tortilla  (The large one is 210 cal and 6 net carbs) Avoid "Low fat dressings, as well as Barry Brunner and [place] dressings They are loaded with sugar!   3 PM/ Mid day  Snack:  Consider  1 ounce of  almonds, walnuts, pistachios, pecans, peanuts,  Macadamia nuts or a nut medley.  Avoid "granola"; the dried cranberries and raisins are loaded with carbohydrates. Mixed nuts as long as there are no raisins,  cranberries or dried fruit.     6 PM  Dinner:     Meat/fowl/fish with a green salad, and either broccoli, cauliflower, green beans, spinach, brussel sprouts or  Lima beans. DO NOT BREAD THE PROTEIN!!      There is a  low carb pasta by Dreamfield's that is acceptable and tastes great: only 5 digestible carbs/serving.( All grocery stores but BJs carry it )  Try Hurley Cisco Angelo's chicken piccata or chicken or eggplant parm over low carb pasta.(Lowes and BJs)   Marjory Lies Sanchez's "Carnitas" (pulled pork, no sauce,  0 carbs) or his beef pot roast to make a dinner burrito (at BJ's)  Pesto over low carb pasta (bj's sells a good quality pesto in the center refrigerated section of the deli   Whole wheat pasta is still full of digestible carbs and  Not as low in glycemic index as Dreamfield's.   Brown rice is still rice,  So skip the rice and noodles if you eat [place] or [place] (or at least limit to 1/2 cup)  9 PM snack :   Breyer's "low carb" fudgsicle or  ice cream bar (Carb Smart line), or  Weight Watcher's ice cream bar , or another "no sugar added" ice cream;  a serving of  fresh berries/cherries with whipped cream   Cheese or DANNON'S LlGHT N FIT [place] YOGURT  Avoid bananas, pineapple, grapes  and watermelon on a regular basis because they are high in sugar.  THINK OF THEM AS DESSERT  Remember that snack Substitutions should be less than 10 NET carbs per serving and meals < 20 carbs. Remember to subtract fiber grams to get the "net carbs."  Polycystic Ovarian Syndrome Polycystic ovarian syndrome (PCOS) is a common hormonal disorder among women of reproductive age. Most women with PCOS grow many small cysts on their ovaries. PCOS can cause problems with your periods and make it difficult to get pregnant. It can also cause an increased risk of miscarriage with pregnancy. If left untreated, PCOS can lead to serious health problems, such as diabetes and heart disease. CAUSES The cause of PCOS is not fully understood, but genetics may be a factor. SIGNS AND SYMPTOMS   Infrequent or no menstrual periods.   Inability to get pregnant (infertility) because of not ovulating.   Increased growth of hair on the face, chest, stomach, back, thumbs, thighs, or toes.   Acne, oily skin, or dandruff.   Pelvic pain.   Weight gain or obesity, usually carrying extra weight around the waist.   Type 2 diabetes.   High cholesterol.   High blood pressure.   Female-pattern baldness or thinning hair.   Patches of thickened and dark brown or black skin on the neck, arms, breasts, or thighs.   Tiny excess flaps of skin (skin tags) in the armpits or neck area.   Excessive snoring and having breathing stop at times while asleep (sleep apnea).   Deepening of the voice.   Gestational diabetes when pregnant.  DIAGNOSIS  There is no single test to diagnose PCOS.   Your health care provider will:   Take a medical history.   Perform a pelvic exam.   Have ultrasonography done.   Check your female and female hormone levels.   Measure glucose or sugar  levels in the blood.   Do other blood tests.   If you are producing too many female hormones, your health care provider will make sure it is from PCOS. At the physical exam, your health care provider will want to evaluate the areas of increased hair growth. Try to allow natural hair growth for a few days before the visit.   During a pelvic exam, the ovaries may be enlarged or swollen because of the increased number of small cysts. This  can be seen more easily by using vaginal ultrasonography or screening to examine the ovaries and lining of the uterus (endometrium) for cysts. The uterine lining may become thicker if you have not been having a regular period.  TREATMENT  Because there is no cure for PCOS, it needs to be managed to prevent problems. Treatments are based on your symptoms. Treatment is also based on whether you want to have a baby or whether you need contraception.  Treatment may include:   Progesterone hormone to start a menstrual period.   Birth control pills to make you have regular menstrual periods.   Medicines to make you ovulate, if you want to get pregnant.   Medicines to control your insulin.   Medicine to control your blood pressure.   Medicine and diet to control your high cholesterol and triglycerides in your blood.  Medicine to reduce excessive hair growth.  Surgery, making small holes in the ovary, to decrease the amount of female hormone production. This is done through a long, lighted tube (laparoscope) placed into the pelvis through a tiny incision in the lower abdomen.  HOME CARE INSTRUCTIONS  Only take over-the-counter or prescription medicine as directed by your health care provider.  Pay attention to the foods you eat and your activity levels. This can help reduce the effects of PCOS.  Keep your weight under control.  Eat foods that are low in carbohydrate and high in fiber.  Exercise regularly. SEEK MEDICAL CARE IF:  Your symptoms do  not get better with medicine.  You have new symptoms. Document Released: 04/29/2004 Document Revised: 10/24/2012 Document Reviewed: 06/21/2012 [facility] Patient Information 2014 [place], [place].

## 2013-02-28 NOTE — Progress Notes (Signed)
Pre-visit discussion using our clinic review tool. No additional management support is needed unless otherwise documented below in the visit note.  

## 2013-03-01 ENCOUNTER — Encounter: Payer: Self-pay | Admitting: Internal Medicine

## 2013-03-01 DIAGNOSIS — Z0000 Encounter for general adult medical examination without abnormal findings: Secondary | ICD-10-CM | POA: Insufficient documentation

## 2013-03-01 DIAGNOSIS — N952 Postmenopausal atrophic vaginitis: Secondary | ICD-10-CM | POA: Insufficient documentation

## 2013-03-01 LAB — TSH: TSH: 0.85 u[IU]/mL (ref 0.35–5.50)

## 2013-03-01 NOTE — Assessment & Plan Note (Signed)
Annual comprehensive exam was done including breast, pelvic and PAP smear. All screenings have been addressed .  

## 2013-03-01 NOTE — Assessment & Plan Note (Signed)
Well controlled on current regimen. Renal function stable, no changes today. 

## 2013-03-01 NOTE — Assessment & Plan Note (Signed)
I have addressed  BMI and recommended wt loss of 10% of body weigh over the next 6 months using a low glycemic index diet and regular exercise a minimum of 5 days per week. We discussed her concern about polycystic ovarian syndrome. I explained that This is a pre-menopausal diagnosis and does not cause obesity hypertriglyceridemia and diabetes but is considered to be the result of these metabolic states.

## 2013-03-01 NOTE — Assessment & Plan Note (Signed)
Her symptoms were brought on by sexual intercourse. She has been given samples of Premarin cream to try. We discussed the alternative being Vagifem if she is not comfortable with the amount of moisture the Premarin cream results in imparting

## 2013-03-03 ENCOUNTER — Encounter: Payer: Self-pay | Admitting: Internal Medicine

## 2013-03-03 ENCOUNTER — Other Ambulatory Visit: Payer: Self-pay | Admitting: Internal Medicine

## 2013-03-03 DIAGNOSIS — E785 Hyperlipidemia, unspecified: Secondary | ICD-10-CM | POA: Insufficient documentation

## 2013-03-03 MED ORDER — ATORVASTATIN CALCIUM 20 MG PO TABS: 20.0000 mg | ORAL_TABLET | Freq: Every day | ORAL | Status: DC

## 2013-03-03 NOTE — Addendum Note (Signed)
Addended by: Crecencio Mc on: 03/03/2013 07:08 PM   Modules accepted: Orders

## 2013-03-04 ENCOUNTER — Encounter: Payer: Self-pay | Admitting: Internal Medicine

## 2013-03-04 MED ORDER — ESTROGENS, CONJUGATED 0.625 MG/GM VA CREA: TOPICAL_CREAM | VAGINAL | Status: DC

## 2013-03-06 NOTE — Telephone Encounter (Signed)
Mailed unread my chart message to patient

## 2013-03-20 ENCOUNTER — Encounter: Payer: Self-pay | Admitting: Internal Medicine

## 2013-03-30 LAB — HM PAP SMEAR: HM Pap smear: NORMAL

## 2013-04-26 LAB — HM MAMMOGRAPHY: HM MAMMO: NORMAL

## 2013-05-16 ENCOUNTER — Other Ambulatory Visit: Payer: Self-pay | Admitting: *Deleted

## 2013-05-16 MED ORDER — FUROSEMIDE 20 MG PO TABS: ORAL_TABLET | ORAL | Status: DC

## 2013-05-21 ENCOUNTER — Other Ambulatory Visit: Payer: Self-pay | Admitting: Internal Medicine

## 2013-06-13 ENCOUNTER — Encounter: Payer: Self-pay | Admitting: Internal Medicine

## 2013-08-22 ENCOUNTER — Other Ambulatory Visit: Payer: Self-pay | Admitting: Internal Medicine

## 2013-11-13 ENCOUNTER — Other Ambulatory Visit: Payer: Self-pay | Admitting: Internal Medicine

## 2013-12-14 ENCOUNTER — Other Ambulatory Visit: Payer: Self-pay | Admitting: Internal Medicine

## 2014-01-07 ENCOUNTER — Encounter: Payer: Self-pay | Admitting: Internal Medicine

## 2014-01-07 ENCOUNTER — Other Ambulatory Visit: Payer: Self-pay | Admitting: Internal Medicine

## 2014-01-08 MED ORDER — METOPROLOL SUCCINATE ER 25 MG PO TB24: 25.0000 mg | ORAL_TABLET | Freq: Every day | ORAL | Status: DC

## 2014-01-08 NOTE — Telephone Encounter (Signed)
She needs 6 month follow ups, correct?

## 2014-02-15 ENCOUNTER — Other Ambulatory Visit: Payer: Self-pay | Admitting: Internal Medicine

## 2014-02-17 NOTE — Telephone Encounter (Signed)
Appt 03/07/14

## 2014-03-07 ENCOUNTER — Encounter: Payer: BC Managed Care – PPO | Admitting: Internal Medicine

## 2014-03-17 ENCOUNTER — Other Ambulatory Visit: Payer: Self-pay | Admitting: Internal Medicine

## 2014-03-17 NOTE — Telephone Encounter (Signed)
Appt 03/31/14

## 2014-03-31 ENCOUNTER — Encounter: Payer: Self-pay | Admitting: Internal Medicine

## 2014-03-31 ENCOUNTER — Ambulatory Visit (INDEPENDENT_AMBULATORY_CARE_PROVIDER_SITE_OTHER): Payer: BC Managed Care – PPO | Admitting: Internal Medicine

## 2014-03-31 VITALS — BP 138/84 | HR 75 | Temp 98.1°F | Resp 14 | Ht 62.25 in | Wt 226.0 lb

## 2014-03-31 DIAGNOSIS — Z1239 Encounter for other screening for malignant neoplasm of breast: Secondary | ICD-10-CM

## 2014-03-31 DIAGNOSIS — Z1211 Encounter for screening for malignant neoplasm of colon: Secondary | ICD-10-CM

## 2014-03-31 DIAGNOSIS — Z6841 Body Mass Index (BMI) 40.0 and over, adult: Secondary | ICD-10-CM

## 2014-03-31 DIAGNOSIS — I10 Essential (primary) hypertension: Secondary | ICD-10-CM

## 2014-03-31 DIAGNOSIS — Z8619 Personal history of other infectious and parasitic diseases: Secondary | ICD-10-CM

## 2014-03-31 DIAGNOSIS — R5383 Other fatigue: Secondary | ICD-10-CM

## 2014-03-31 DIAGNOSIS — Z0000 Encounter for general adult medical examination without abnormal findings: Secondary | ICD-10-CM

## 2014-03-31 DIAGNOSIS — F329 Major depressive disorder, single episode, unspecified: Secondary | ICD-10-CM

## 2014-03-31 DIAGNOSIS — E669 Obesity, unspecified: Secondary | ICD-10-CM

## 2014-03-31 DIAGNOSIS — E785 Hyperlipidemia, unspecified: Secondary | ICD-10-CM

## 2014-03-31 DIAGNOSIS — F32A Depression, unspecified: Secondary | ICD-10-CM

## 2014-03-31 LAB — CBC WITH DIFFERENTIAL/PLATELET
BASOS PCT: 0.3 % (ref 0.0–3.0)
Basophils Absolute: 0 10*3/uL (ref 0.0–0.1)
EOS PCT: 1.6 % (ref 0.0–5.0)
Eosinophils Absolute: 0.1 10*3/uL (ref 0.0–0.7)
HCT: 40 % (ref 36.0–46.0)
HEMOGLOBIN: 13.7 g/dL (ref 12.0–15.0)
LYMPHS PCT: 32.3 % (ref 12.0–46.0)
Lymphs Abs: 2 10*3/uL (ref 0.7–4.0)
MCHC: 34.4 g/dL (ref 30.0–36.0)
MCV: 89.6 fl (ref 78.0–100.0)
Monocytes Absolute: 0.4 10*3/uL (ref 0.1–1.0)
Monocytes Relative: 6.2 % (ref 3.0–12.0)
NEUTROS ABS: 3.7 10*3/uL (ref 1.4–7.7)
NEUTROS PCT: 59.6 % (ref 43.0–77.0)
Platelets: 202 10*3/uL (ref 150.0–400.0)
RBC: 4.46 Mil/uL (ref 3.87–5.11)
RDW: 13.5 % (ref 11.5–15.5)
WBC: 6.2 10*3/uL (ref 4.0–10.5)

## 2014-03-31 LAB — COMPREHENSIVE METABOLIC PANEL
ALBUMIN: 4.6 g/dL (ref 3.5–5.2)
ALT: 24 U/L (ref 0–35)
AST: 23 U/L (ref 0–37)
Alkaline Phosphatase: 48 U/L (ref 39–117)
BUN: 18 mg/dL (ref 6–23)
CALCIUM: 10.2 mg/dL (ref 8.4–10.5)
CO2: 30 meq/L (ref 19–32)
Chloride: 105 mEq/L (ref 96–112)
Creatinine, Ser: 0.76 mg/dL (ref 0.40–1.20)
GFR: 81.81 mL/min (ref >60.00)
GLUCOSE: 94 mg/dL (ref 70–99)
POTASSIUM: 4.2 meq/L (ref 3.5–5.1)
SODIUM: 140 meq/L (ref 135–145)
TOTAL PROTEIN: 7 g/dL (ref 6.0–8.3)
Total Bilirubin: 0.4 mg/dL (ref 0.2–1.2)

## 2014-03-31 LAB — LIPID PANEL
Cholesterol: 162 mg/dL (ref 0–200)
HDL: 54 mg/dL (ref >39.00)
LDL Cholesterol: 97 mg/dL (ref 0–99)
NonHDL: 108
TRIGLYCERIDES: 53 mg/dL (ref 0.0–149.0)
Total CHOL/HDL Ratio: 3
VLDL: 10.6 mg/dL (ref 0.0–40.0)

## 2014-03-31 LAB — HEMOGLOBIN A1C: Hgb A1c MFr Bld: 5.7 % (ref 4.6–6.5)

## 2014-03-31 LAB — TSH: TSH: 0.65 u[IU]/mL (ref 0.35–4.50)

## 2014-03-31 MED ORDER — BUPROPION HCL ER (XL) 150 MG PO TB24: 150.0000 mg | ORAL_TABLET | Freq: Every day | ORAL | Status: DC

## 2014-03-31 MED ORDER — ACYCLOVIR 400 MG PO TABS: 400.0000 mg | ORAL_TABLET | ORAL | Status: DC

## 2014-03-31 NOTE — Progress Notes (Signed)
Pre-visit discussion using our clinic review tool. No additional management support is needed unless otherwise documented below in the visit note.  

## 2014-03-31 NOTE — Patient Instructions (Signed)
Trial of Wellbutrin XL for your depression.  Take it in the morning  We can add phentermine in a few weeks if you are unable to lose with diet and exercise.  This is Dr. Lupita Dawn version of a  "Low GI"  Weight loss Diet.  It is appropriate for all patients with normal renal function , gluten tolerance, and advised for patients who have prediabetes or diabetes:   All of the foods can be found at grocery stores and in bulk at [employer].  The Atkins protein bars and shakes are available in more varieties at Target, WalMart and [place].     7 AM Breakfast:  Choose from the following:  < 5 carbs  Weekdays: Low carbohydrate Protein  Shakes (EAS AdvantEdge "Carb  Control" shakes, Atkins,  Muscle Milk or Premier Protein shakes)     Weekends:  a scrambled egg/bacon/cheese burrito made with Mission's "carb  balance" whole wheat tortilla  (about 10 net carbs )  Eggs,  bacon /sausage , Joseph's pita /lavash bread or  (5 carbs)  A slice of fritatta ( egg based baked dish, no  crust:  google it) (< 10 carbs)   Avoid cereal and bananas, oatmeal and cream of wheat and grits. They are loaded with carbohydrates!   10 AM: high protein snack  (< 5 carbs)   Protein bar by Atkins  Or KIND  (the snack size, < 200 cal, usually < 6 carbs    A stick of cheese:  Around 1 carb,  100 cal      Other so called "protein bars" tend to be loaded with carbohydrates.  Remember, in food advertising, the word "energy" is synonymous for " carbohydrate."  Lunch:   A Sandwich using the bread choices listed, Can use any  Eggs,  lunchmeat, grilled meat or canned tuna).  Can add avocado, regular mayo/mustard  and cheese.  A Salad using blue cheese, ranch,  Goddess dressing  or vinagrette,  No croutons or "confetti" and no "candied nuts" but regular nuts OK.   2 HARD BOILED EGG WHITES AND A CUP OF one of these greek yogurts:    dannon lt n fit greek yogurt         chobani 100 greek yogurt,    Oikos triple zero greek yogurt        No pretzels or chips.  Pickles and miniature sweet peppers are a good low  carb alternative that provide a "crunch"  The bread is the only source of carbohydrate in a sandwich and  can be decreased by trying some of these alternatives to traditional loaf bread:   Joseph's pita bread and Lavash (flat) bread :  50 cal and 4 net carbs  available at BJs and WalMart.  Taste better when toasted, use as pita chips  [employer] makes a variety of  flatbreads and  A PITA POCKET.    LOOK FOR  THE ONES THAT ARE 17 NET CARBS OR LESS    Mission makes 2 sizes of  Low carb whole wheat tortillas  (The large one is  210 cal and 6 net carbs)   Avoid "Low fat dressings, as well as Barry Brunner and [place] dressings    3 PM/ Mid day  Snack:  Consider  1 ounce of  almonds, walnuts, pistachios, pecans, peanuts,  Macadamia nuts or a nut medley that does not contain raisins or cranberries.  No "granola"; the dried cranberries and raisins are loaded with carbohydrates. Mixed  nuts as long as there are no raisins,  cranberries or dried fruit.    Try the prosciutto/mozzarella cheese sticks by [employer]  In deli /backery section   High protein   To avoid overindulging in snacks: Try drinking a glass of unsweeted almond/coconut milk  Or a cup of coffee with your Atkins chocolate bar to keep you from having 3!!!   Pork rinds!  Yes Pork Rinds are low carb potato chip substitute!   Toasted Joseph's flatbread with hummous dip (chickpeas)       6 PM  Dinner:     Meat/fowl/fish with a green salad, and either broccoli, cauliflower, green beans, spinach, brussel sprouts, bok choy or  Lima beans. Fried in canola oil /olive oil BUT DO NOT BREAD THE PROTEIN!!      There is a low carb pasta by Dreamfield's that is acceptable and tastes great: only 5 digestible carbs/serving.( All grocery stores but BJs carry it )  Prepared Meals:  Try Hurley Cisco Angelo's chicken piccata or chicken or eggplant parm over low carb pasta.(Lowes and  BJs)   Marjory Lies Sanchez's "Carnitas" (pulled pork, no sauce,  0 carbs) or his beef pot roast to make a dinner burrito (at [employer])  Barbecue with cole slaw is low carb BUT NO BUN!  SAME WITH HAMBURGERS     Whole wheat pasta is still full of digestible carbs and  Not as low in glycemic index as Dreamfield's.   Brown rice is still rice,  So skip the rice and noodles if you eat [place] or [place] (or at least limit to 1/2 cup)  9 PM snack :   Breyer's "low carb" fudgsicle or  ice cream bar (Carb Smart line), or  Weight  Watcher's ice cream bar , or another "no sugar added" ice cream;  a serving of fresh berries/cherries with whipped cream   Cheese or greek yogurt   8 ounces of [place] unsweetened almond/cococunut milk  Cheese and crackers (using WASA crackers,  They are low carb) or peanut butter on low carb crackers or pita bread     Avoid bananas, pineapple, grapes  and watermelon on a regular basis because they are high in sugar.  THINK OF THEM AS DESSERT and do not have daily   Remember that snack Substitutions should be less than 10 NET carbs per serving and meals should be < 20 net carbs. Remember that carbohydrates from fiber do not affect blood sugar, so you can  subtract fiber grams to get the "net carbs " of any particular food item.    Health Maintenance Adopting a healthy lifestyle and getting preventive care can go a long way to promote health and wellness. Talk with your health care provider about what schedule of regular examinations is right for you. This is a good chance for you to check in with your provider about disease prevention and staying healthy. In between checkups, there are plenty of things you can do on your own. Experts have done a lot of research about which lifestyle changes and preventive measures are most likely to keep you healthy. Ask your health care provider for more information. WEIGHT AND DIET  Eat a healthy diet  Be sure to include plenty of vegetables,  fruits, low-fat dairy products, and lean protein.  Do not eat a lot of foods high in solid fats, added sugars, or salt.  Get regular exercise. This is one of the most important things you can do for your health.  Most  adults should exercise for at least 150 minutes each week. The exercise should increase your heart rate and make you sweat (moderate-intensity exercise).  Most adults should also do strengthening exercises at least twice a week. This is in addition to the moderate-intensity exercise.  Maintain a healthy weight  Body mass index (BMI) is a measurement that can be used to identify possible weight problems. It estimates body fat based on height and weight. Your health care provider can help determine your BMI and help you achieve or maintain a healthy weight.  For females 89 years of age and older:   A BMI below 18.5 is considered underweight.  A BMI of 18.5 to 24.9 is normal.  A BMI of 25 to 29.9 is considered overweight.  A BMI of 30 and above is considered obese.  Watch levels of cholesterol and blood lipids  You should start having your blood tested for lipids and cholesterol at 63 years of age, then have this test every 5 years.  You may need to have your cholesterol levels checked more often if:  Your lipid or cholesterol levels are high.  You are older than 63 years of age.  You are at high risk for heart disease.  CANCER SCREENING   Lung Cancer  Lung cancer screening is recommended for adults 76-62 years old who are at high risk for lung cancer because of a history of smoking.  A yearly low-dose CT scan of the lungs is recommended for people who:  Currently smoke.  Have quit within the past 15 years.  Have at least a 30-pack-year history of smoking. A pack year is smoking an average of one pack of cigarettes a day for 1 year.  Yearly screening should continue until it has been 15 years since you quit.  Yearly screening should stop if you develop  a health problem that would prevent you from having lung cancer treatment.  Breast Cancer  Practice breast self-awareness. This means understanding how your breasts normally appear and feel.  It also means doing regular breast self-exams. Let your health care provider know about any changes, no matter how small.  If you are in your 20s or 30s, you should have a clinical breast exam (CBE) by a health care provider every 1-3 years as part of a regular health exam.  If you are 9 or older, have a CBE every year. Also consider having a breast X-ray (mammogram) every year.  If you have a family history of breast cancer, talk to your health care provider about genetic screening.  If you are at high risk for breast cancer, talk to your health care provider about having an MRI and a mammogram every year.  Breast cancer gene (BRCA) assessment is recommended for women who have family members with BRCA-related cancers. BRCA-related cancers include:  Breast.  Ovarian.  Tubal.  Peritoneal cancers.  Results of the assessment will determine the need for genetic counseling and BRCA1 and BRCA2 testing. Cervical Cancer Routine pelvic examinations to screen for cervical cancer are no longer recommended for nonpregnant women who are considered low risk for cancer of the pelvic organs (ovaries, uterus, and vagina) and who do not have symptoms. A pelvic examination may be necessary if you have symptoms including those associated with pelvic infections. Ask your health care provider if a screening pelvic exam is right for you.   The Pap test is the screening test for cervical cancer for women who are considered at risk.  If you  had a hysterectomy for a problem that was not cancer or a condition that could lead to cancer, then you no longer need Pap tests.  If you are older than 65 years, and you have had normal Pap tests for the past 10 years, you no longer need to have Pap tests.  If you have had past  treatment for cervical cancer or a condition that could lead to cancer, you need Pap tests and screening for cancer for at least 20 years after your treatment.  If you no longer get a Pap test, assess your risk factors if they change (such as having a new sexual partner). This can affect whether you should start being screened again.  Some women have medical problems that increase their chance of getting cervical cancer. If this is the case for you, your health care provider may recommend more frequent screening and Pap tests.  The human papillomavirus (HPV) test is another test that may be used for cervical cancer screening. The HPV test looks for the virus that can cause cell changes in the cervix. The cells collected during the Pap test can be tested for HPV.  The HPV test can be used to screen women 47 years of age and older. Getting tested for HPV can extend the interval between normal Pap tests from three to five years.  An HPV test also should be used to screen women of any age who have unclear Pap test results.  After 63 years of age, women should have HPV testing as often as Pap tests.  Colorectal Cancer  This type of cancer can be detected and often prevented.  Routine colorectal cancer screening usually begins at 62 years of age and continues through 63 years of age.  Your health care provider may recommend screening at an earlier age if you have risk factors for colon cancer.  Your health care provider may also recommend using home test kits to check for hidden blood in the stool.  A small camera at the end of a tube can be used to examine your colon directly (sigmoidoscopy or colonoscopy). This is done to check for the earliest forms of colorectal cancer.  Routine screening usually begins at age 64.  Direct examination of the colon should be repeated every 5-10 years through 63 years of age. However, you may need to be screened more often if early forms of precancerous polyps  or small growths are found. Skin Cancer  Check your skin from head to toe regularly.  Tell your health care provider about any new moles or changes in moles, especially if there is a change in a mole's shape or color.  Also tell your health care provider if you have a mole that is larger than the size of a pencil eraser.  Always use sunscreen. Apply sunscreen liberally and repeatedly throughout the day.  Protect yourself by wearing long sleeves, pants, a wide-brimmed hat, and sunglasses whenever you are outside. HEART DISEASE, DIABETES, AND HIGH BLOOD PRESSURE   Have your blood pressure checked at least every 1-2 years. High blood pressure causes heart disease and increases the risk of stroke.  If you are between 14 years and 56 years old, ask your health care provider if you should take aspirin to prevent strokes.  Have regular diabetes screenings. This involves taking a blood sample to check your fasting blood sugar level.  If you are at a normal weight and have a low risk for diabetes, have this test once every  three years after 63 years of age.  If you are overweight and have a high risk for diabetes, consider being tested at a younger age or more often. PREVENTING INFECTION  Hepatitis B  If you have a higher risk for hepatitis B, you should be screened for this virus. You are considered at high risk for hepatitis B if:  You were born in a country where hepatitis B is common. Ask your health care provider which countries are considered high risk.  Your parents were born in a high-risk country, and you have not been immunized against hepatitis B (hepatitis B vaccine).  You have HIV or AIDS.  You use needles to inject street drugs.  You live with someone who has hepatitis B.  You have had sex with someone who has hepatitis B.  You get hemodialysis treatment.  You take certain medicines for conditions, including cancer, organ transplantation, and autoimmune  conditions. Hepatitis C  Blood testing is recommended for:  Everyone born from [birth year] through 1964.  Anyone with known risk factors for hepatitis C. Sexually transmitted infections (STIs)  You should be screened for sexually transmitted infections (STIs) including gonorrhea and chlamydia if:  You are sexually active and are younger than 63 years of age.  You are older than 63 years of age and your health care provider tells you that you are at risk for this type of infection.  Your sexual activity has changed since you were last screened and you are at an increased risk for chlamydia or gonorrhea. Ask your health care provider if you are at risk.  If you do not have HIV, but are at risk, it may be recommended that you take a prescription medicine daily to prevent HIV infection. This is called pre-exposure prophylaxis (PrEP). You are considered at risk if:  You are sexually active and do not regularly use condoms or know the HIV status of your partner(s).  You take drugs by injection.  You are sexually active with a partner who has HIV. Talk with your health care provider about whether you are at high risk of being infected with HIV. If you choose to begin PrEP, you should first be tested for HIV. You should then be tested every 3 months for as long as you are taking PrEP.  PREGNANCY   If you are premenopausal and you may become pregnant, ask your health care provider about preconception counseling.  If you may become pregnant, take 400 to 800 micrograms (mcg) of folic acid every day.  If you want to prevent pregnancy, talk to your health care provider about birth control (contraception). OSTEOPOROSIS AND MENOPAUSE   Osteoporosis is a disease in which the bones lose minerals and strength with aging. This can result in serious bone fractures. Your risk for osteoporosis can be identified using a bone density scan.  If you are 60 years of age or older, or if you are at risk for  osteoporosis and fractures, ask your health care provider if you should be screened.  Ask your health care provider whether you should take a calcium or vitamin D supplement to lower your risk for osteoporosis.  Menopause may have certain physical symptoms and risks.  Hormone replacement therapy may reduce some of these symptoms and risks. Talk to your health care provider about whether hormone replacement therapy is right for you.  HOME CARE INSTRUCTIONS   Schedule regular health, dental, and eye exams.  Stay current with your immunizations.   Do not use  any tobacco products including cigarettes, chewing tobacco, or electronic cigarettes.  If you are pregnant, do not drink alcohol.  If you are breastfeeding, limit how much and how often you drink alcohol.  Limit alcohol intake to no more than 1 drink per day for nonpregnant women. One drink equals 12 ounces of beer, 5 ounces of wine, or 1 ounces of hard liquor.  Do not use street drugs.  Do not share needles.  Ask your health care provider for help if you need support or information about quitting drugs.  Tell your health care provider if you often feel depressed.  Tell your health care provider if you have ever been abused or do not feel safe at home. Document Released: 07/19/2010 Document Revised: 05/20/2013 Document Reviewed: 12/05/2012 [facility] Patient Information 2015 [place], [place]. This information is not intended to replace advice given to you by your health care provider. Make sure you discuss any questions you have with your health care provider.

## 2014-03-31 NOTE — Progress Notes (Signed)
Patient ID: Bresha Hosack, female   DOB: January 02, 1951, 64 y.o.   MRN: [id number]  Subjective:     Michele Garza is a 64 y.o. female and is here for a comprehensive physical exam. The patient reports as follows:    1) Signs and symptoms of depression:  Tearful in office today.  Since October has been feeling more and more withdrawn,   Wants to stay in bed all day .Marland Kitchen  Once she's at work, she's ok.  At home she doesn't t want to walk her dog,  Mother is 41 and  Seems more frail. May be affecting her outlook.  .  Wants to get out and enjoy life more but feels depressed .  2) Obesity:  Body mass index is 41.01 kg/(m^2).   Wt Readings from Last 3 Encounters:  03/31/14 226 lb (102.513 kg)  02/28/13 226 lb (102.513 kg)  06/08/12 221 lb 8 oz (100.472 kg)    3) joint pain:  Her knees occasionally bothering her,   Using Aleve  4)  Has been having some constipation   5) HM:  She is Due for colonoscopy   History   Social History  . Marital Status: Divorced    Spouse Name: N/A  . Number of Children: N/A  . Years of Education: N/A   Occupational History  . 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)   Social History Main Topics  . Smoking status: Never Smoker   . Smokeless tobacco: Never Used  . Alcohol Use: 0.6 oz/week    1 Shots of liquor per week     Comment: occasional  . Drug Use: No  . Sexual Activity: Not on file   Other Topics Concern  . Not on file   Social History Narrative   Health Maintenance  Topic Date Due  . HIV Screening  09/11/1966  . ZOSTAVAX  09/11/2011  . INFLUENZA VACCINE  04/15/2016 (Originally 08/17/2013)  . MAMMOGRAM  04/27/2015  . PAP SMEAR  03/30/2016  . COLONOSCOPY  01/31/2019  . TETANUS/TDAP  03/01/2023    The following portions of the patient's history were reviewed and updated as appropriate: allergies, current medications, past family history, past medical history, past social history, past surgical history and problem  list.  Review of Systems  Patient denies headache, fevers, malaise, unintentional weight loss, skin rash, eye pain, sinus congestion and sinus pain, sore throat, dysphagia,  hemoptysis , cough, dyspnea, wheezing, chest pain, palpitations, orthopnea, edema, abdominal pain, nausea, melena, diarrhea, constipation, flank pain, dysuria, hematuria, urinary  Frequency, nocturia, numbness, tingling, seizures,  Focal weakness, Loss of consciousness,  Tremor, insomnia, anxiety, and suicidal ideation.      Objective:  BP 138/84 mmHg  Pulse 75  Temp(Src) 98.1 F (36.7 C) (Oral)  Resp 14  Ht 5' 2.25" (1.581 m)  Wt 226 lb (102.513 kg)  BMI 41.01 kg/m2  SpO2 98%   General appearance: obese, alert, cooperative and appears stated age Head: Normocephalic, without obvious abnormality, atraumatic Eyes: conjunctivae/corneas clear. PERRL, EOM's intact. Fundi benign. Ears: normal TM's and external ear canals both ears Nose: Nares normal. Septum midline. Mucosa normal. No drainage or sinus tenderness. Throat: lips, mucosa, and tongue normal; teeth and gums normal Neck: no adenopathy, no carotid bruit, no JVD, supple, symmetrical, trachea midline and thyroid not enlarged, symmetric, no tenderness/mass/nodules Lungs: clear to auscultation bilaterally Breasts: normal appearance, no masses or tenderness Heart: regular rate and rhythm, S1, S2 normal, no murmur, click, rub or  gallop Abdomen: soft, non-tender; bowel sounds normal; no masses,  no organomegaly Extremities: extremities normal, atraumatic, no cyanosis or edema Pulses: 2+ and symmetric Skin: Skin color, texture, turgor normal. No rashes or lesions Neurologic: Alert and oriented X 3, normal strength and tone. Normal symmetric reflexes. Normal coordination and gait.  Psych: affect normal, makes good eye contact. No fidgeting,  tearful.  Denies suicidal thoughts    Assessment and Plan:   Problem List Items Addressed This Visit      Unprioritized    Visit for preventive health examination    Annual wellness  exam was done as well as a comprehensive physical exam and management of acute and chronic conditions .  During the course of the visit the patient was educated and counseled about appropriate screening and preventive services including :  diabetes screening, lipid analysis with projected  10 year  risk for CAD , nutrition counseling, colorectal cancer screening, and recommended immunizations.  Printed recommendations for health maintenance screenings was given.        Morbid obesity with BMI of 40.0-44.9, adult    I have addressed  BMI and recommended a low glycemic index diet utilizing smaller more frequent meals to increase metabolism.  I have also recommended that patient start exercising with a goal of 30 minutes of aerobic exercise a minimum of 5 days per week. Screening for lipid disorders, thyroid and diabetes to be done today.  Will consider phentermine in a month if no improvement.   .      Hypertension    Borderline control on current regimen. Renal function is stable, no changes today  Lab Results  Component Value Date   CREATININE 0.76 03/31/2014   Lab Results  Component Value Date   NA 140 03/31/2014   K 4.2 03/31/2014   CL 105 03/31/2014   CO2 30 03/31/2014         Hyperlipidemia    She was started on atorvastatin for  Mitigation of CAD risk based on ACC guidelines recommending patients aged 73 or higher start moderate intensity statin therapy for LDL between 70-189 and 10 yr risk of CAD > 7.5% .   Lab Results  Component Value Date   CHOL 162 03/31/2014   HDL 54.00 03/31/2014   LDLCALC 97 03/31/2014   LDLDIRECT 171.2 02/28/2013   TRIG 53.0 03/31/2014   CHOLHDL 3 03/31/2014   Lab Results  Component Value Date   ALT 24 03/31/2014   AST 23 03/31/2014   ALKPHOS 48 03/31/2014   BILITOT 0.4 03/31/2014          Relevant Orders   Lipid panel (Completed)   History of herpes zoster - Primary    Relevant Medications   acyclovir (ZOVIRAX) tablet   Depression    Trial of Wellbutrin offered based on signs and symptoms.  Risks and benefits dicussed.       Relevant Medications   buPROPion (WELLBUTRIN XL) 24 hr tablet    Other Visit Diagnoses    Screening for colon cancer        Relevant Orders    Ambulatory referral to Gastroenterology    Screening for breast cancer        Relevant Orders    MM DIGITAL SCREENING BILATERAL    Obesity        Relevant Orders    Hemoglobin A1c (Completed)    Other fatigue        Relevant Orders    Comprehensive metabolic panel (Completed)  CBC with Differential/Platelet (Completed)    TSH (Completed)

## 2014-04-01 ENCOUNTER — Encounter: Payer: Self-pay | Admitting: Internal Medicine

## 2014-04-01 NOTE — Assessment & Plan Note (Signed)
Trial of Wellbutrin offered based on signs and symptoms.  Risks and benefits dicussed.

## 2014-04-01 NOTE — Assessment & Plan Note (Addendum)
She was started on atorvastatin for  Mitigation of CAD risk based on ACC guidelines recommending patients aged 63 or higher start moderate intensity statin therapy for LDL between 70-189 and 10 yr risk of CAD > 7.5% .   Lab Results  Component Value Date   CHOL 162 03/31/2014   HDL 54.00 03/31/2014   LDLCALC 97 03/31/2014   LDLDIRECT 171.2 02/28/2013   TRIG 53.0 03/31/2014   CHOLHDL 3 03/31/2014   Lab Results  Component Value Date   ALT 24 03/31/2014   AST 23 03/31/2014   ALKPHOS 48 03/31/2014   BILITOT 0.4 03/31/2014

## 2014-04-01 NOTE — Assessment & Plan Note (Signed)
Borderline control on current regimen. Renal function is stable, no changes today  Lab Results  Component Value Date   CREATININE 0.76 03/31/2014   Lab Results  Component Value Date   NA 140 03/31/2014   K 4.2 03/31/2014   CL 105 03/31/2014   CO2 30 03/31/2014

## 2014-04-01 NOTE — Assessment & Plan Note (Signed)

## 2014-04-01 NOTE — Assessment & Plan Note (Addendum)
I have addressed  BMI and recommended a low glycemic index diet utilizing smaller more frequent meals to increase metabolism.  I have also recommended that patient start exercising with a goal of 30 minutes of aerobic exercise a minimum of 5 days per week. Screening for lipid disorders, thyroid and diabetes to be done today.  Will consider phentermine in a month if no improvement.   Marland Kitchen

## 2014-04-02 ENCOUNTER — Encounter: Payer: Self-pay | Admitting: Internal Medicine

## 2014-04-02 ENCOUNTER — Other Ambulatory Visit: Payer: Self-pay | Admitting: Internal Medicine

## 2014-04-06 ENCOUNTER — Other Ambulatory Visit: Payer: Self-pay | Admitting: Internal Medicine

## 2014-04-16 ENCOUNTER — Other Ambulatory Visit: Payer: Self-pay | Admitting: Internal Medicine

## 2014-04-28 MED ORDER — BUPROPION HCL ER (XL) 300 MG PO TB24: 300.0000 mg | ORAL_TABLET | Freq: Every day | ORAL | Status: DC

## 2014-04-30 LAB — HM MAMMOGRAPHY

## 2014-05-12 ENCOUNTER — Other Ambulatory Visit: Payer: Self-pay | Admitting: Internal Medicine

## 2014-06-02 ENCOUNTER — Other Ambulatory Visit: Payer: Self-pay | Admitting: Internal Medicine

## 2014-06-20 LAB — HM COLONOSCOPY

## 2014-07-04 ENCOUNTER — Ambulatory Visit: Payer: BC Managed Care – PPO | Admitting: Internal Medicine

## 2014-07-07 ENCOUNTER — Encounter: Payer: Self-pay | Admitting: Internal Medicine

## 2014-07-11 ENCOUNTER — Encounter: Payer: Self-pay | Admitting: Internal Medicine

## 2014-07-11 ENCOUNTER — Ambulatory Visit (INDEPENDENT_AMBULATORY_CARE_PROVIDER_SITE_OTHER): Payer: BC Managed Care – PPO | Admitting: Internal Medicine

## 2014-07-11 VITALS — BP 124/80 | HR 69 | Temp 98.5°F | Resp 14 | Ht 62.0 in | Wt 209.0 lb

## 2014-07-11 DIAGNOSIS — I10 Essential (primary) hypertension: Secondary | ICD-10-CM | POA: Diagnosis not present

## 2014-07-11 DIAGNOSIS — K5900 Constipation, unspecified: Secondary | ICD-10-CM | POA: Diagnosis not present

## 2014-07-11 DIAGNOSIS — Z6841 Body Mass Index (BMI) 40.0 and over, adult: Secondary | ICD-10-CM

## 2014-07-11 MED ORDER — ESTROGENS, CONJUGATED 0.625 MG/GM VA CREA: TOPICAL_CREAM | VAGINAL | Status: DC

## 2014-07-11 MED ORDER — LOSARTAN POTASSIUM 50 MG PO TABS: 50.0000 mg | ORAL_TABLET | Freq: Every day | ORAL | Status: DC

## 2014-07-11 NOTE — Progress Notes (Signed)
Subjective:  Patient ID: Michele Garza, female    DOB: 1950/09/28  Age: 64 y.o. MRN: [id number]  CC: The primary encounter diagnosis was Essential hypertension. Diagnoses of Morbid obesity with BMI of 40.0-44.9, adult and Constipation, unspecified constipation type were also pertinent to this visit.  HPI Michele Garza presents for follow up on hypertension, hyperlipidemia and obesity She has lost 17 lbs ince March and feels much better about herself.  She is exercising three times per week,  following a low GI diet. Eating smaller meals 6 times daily  She is sleeping well ,  And has no new complaints except for mild constipation.    . Tolerating her medications.   Outpatient Prescriptions Prior to Visit  Medication Sig Dispense Refill  . acyclovir (ZOVIRAX) 400 MG tablet Take 1 tablet (400 mg total) by mouth every 4 (four) hours while awake. 28 tablet 3  . aspirin 81 MG tablet Take 81 mg by mouth daily.      Marland Kitchen atorvastatin (LIPITOR) 20 MG tablet TAKE 1 TABLET (20 MG TOTAL) BY MOUTH DAILY. 90 tablet 3  . buPROPion (WELLBUTRIN XL) 300 MG 24 hr tablet TAKE 1 TABLET (300 MG TOTAL) BY MOUTH DAILY. 30 tablet 2  . calcium citrate-vitamin D (CITRACAL+D) 315-200 MG-UNIT per tablet Take 1 tablet by mouth daily.      . fish oil-omega-3 fatty acids 1000 MG capsule Take 2 g by mouth daily.    . furosemide (LASIX) 20 MG tablet TAKE 1 TABLET (20 MG TOTAL) BY MOUTH DAILY. (Patient taking differently: 20 mg. TAKE 1 TABLET (20 MG TOTAL) BY MOUTH DAILY.) 30 tablet 5  . metoprolol succinate (TOPROL-XL) 25 MG 24 hr tablet TAKE 1 TABLET (25 MG TOTAL) BY MOUTH DAILY. 90 tablet 1  . Multiple Vitamin (MULTIVITAMIN) tablet Take 1 tablet by mouth daily.      Marland Kitchen conjugated estrogens (PREMARIN) vaginal cream 1 to 2 grams intravaginally twice weekly 42.5 g 12  . losartan (COZAAR) 50 MG tablet TAKE 1 TABLET BY MOUTH EVERY DAY 30 tablet 6   No facility-administered medications prior to visit.    Review of  Systems;  Patient denies headache, fevers, malaise, unintentional weight loss, skin rash, eye pain, sinus congestion and sinus pain, sore throat, dysphagia,  hemoptysis , cough, dyspnea, wheezing, chest pain, palpitations, orthopnea, edema, abdominal pain, nausea, melena, diarrhea, constipation, flank pain, dysuria, hematuria, urinary  Frequency, nocturia, numbness, tingling, seizures,  Focal weakness, Loss of consciousness,  Tremor, insomnia, depression, anxiety, and suicidal ideation.      Objective:  BP 124/80 mmHg  Pulse 69  Temp(Src) 98.5 F (36.9 C) (Oral)  Resp 14  Ht 5\' 2"  (1.575 m)  Wt 209 lb (94.802 kg)  BMI 38.22 kg/m2  SpO2 98%  BP Readings from Last 3 Encounters:  07/11/14 124/80  03/31/14 138/84  02/28/13 140/78    Wt Readings from Last 3 Encounters:  07/11/14 209 lb (94.802 kg)  03/31/14 226 lb (102.513 kg)  02/28/13 226 lb (102.513 kg)    General appearance: alert, cooperative and appears stated age Ears: normal TM's and external ear canals both ears Throat: lips, mucosa, and tongue normal; teeth and gums normal Neck: no adenopathy, no carotid bruit, supple, symmetrical, trachea midline and thyroid not enlarged, symmetric, no tenderness/mass/nodules Back: symmetric, no curvature. ROM normal. No CVA tenderness. Lungs: clear to auscultation bilaterally Heart: regular rate and rhythm, S1, S2 normal, no murmur, click, rub or gallop Abdomen: soft, non-tender; bowel sounds normal; no masses,  no  organomegaly Pulses: 2+ and symmetric Skin: Skin color, texture, turgor normal. No rashes or lesions Lymph nodes: Cervical, supraclavicular, and axillary nodes normal.  Lab Results  Component Value Date   HGBA1C 5.7 03/31/2014   HGBA1C 5.6 02/28/2013    Lab Results  Component Value Date   CREATININE 0.76 03/31/2014   CREATININE 1.2 02/28/2013   CREATININE 1.0 07/10/2012    Lab Results  Component Value Date   WBC 6.2 03/31/2014   HGB 13.7 03/31/2014   HCT  40.0 03/31/2014   PLT 202.0 03/31/2014   GLUCOSE 94 03/31/2014   CHOL 162 03/31/2014   TRIG 53.0 03/31/2014   HDL 54.00 03/31/2014   LDLDIRECT 171.2 02/28/2013   LDLCALC 97 03/31/2014   ALT 24 03/31/2014   AST 23 03/31/2014   NA 140 03/31/2014   K 4.2 03/31/2014   CL 105 03/31/2014   CREATININE 0.76 03/31/2014   BUN 18 03/31/2014   CO2 30 03/31/2014   TSH 0.65 03/31/2014   HGBA1C 5.7 03/31/2014    No results found.  Assessment & Plan:   Problem List Items Addressed This Visit      Unprioritized   Hypertension - Primary    Well controlled on current regimen. Renal function stable, no changes today. Lab Results  Component Value Date   CREATININE 0.76 03/31/2014   Lab Results  Component Value Date   NA 140 03/31/2014   K 4.2 03/31/2014   CL 105 03/31/2014   CO2 30 03/31/2014         Relevant Medications   losartan (COZAAR) 50 MG tablet   Morbid obesity with BMI of 40.0-44.9, adult    I have congratulated her in reduction of   BMI and encouraged  Continued weight loss with goal of 10% of body weigh over the next 6 months using a low glycemic index diet and regular exercise a minimum of 5 days per week.        Constipation    Recommended use of bulk forming laxatives once daily before her largest meal.          I have changed Ms. Maniaci losartan. I am also having her maintain her multivitamin, aspirin, calcium citrate-vitamin D, fish oil-omega-3 fatty acids, furosemide, acyclovir, atorvastatin, metoprolol succinate, buPROPion, and conjugated estrogens.  Meds ordered this encounter  Medications  . losartan (COZAAR) 50 MG tablet    Sig: Take 1 tablet (50 mg total) by mouth daily.    Dispense:  90 tablet    Refill:  1  . conjugated estrogens (PREMARIN) vaginal cream    Sig: 1 to 2 grams intravaginally twice weekly    Dispense:  42.5 g    Refill:  12    Medications Discontinued During This Encounter  Medication Reason  . losartan (COZAAR) 50 MG  tablet Reorder  . conjugated estrogens (PREMARIN) vaginal cream Reorder    Follow-up: Return in about 3 months (around 10/11/2014).   Crecencio Mc, MD

## 2014-07-11 NOTE — Patient Instructions (Addendum)
You have done great!!   You are down 17 lbs !!  For the constipation:  Try adding one serving  of either miralax,  Citrucel,  Benefiber, pr metamucil in 8 ounces of water  At least 30 minutes before your biggest meal .  This will also  decrease your appetite

## 2014-07-14 DIAGNOSIS — K5900 Constipation, unspecified: Secondary | ICD-10-CM | POA: Insufficient documentation

## 2014-07-14 NOTE — Assessment & Plan Note (Signed)
Well controlled on current regimen. Renal function stable, no changes today. Lab Results  Component Value Date   CREATININE 0.76 03/31/2014   Lab Results  Component Value Date   NA 140 03/31/2014   K 4.2 03/31/2014   CL 105 03/31/2014   CO2 30 03/31/2014

## 2014-07-14 NOTE — Assessment & Plan Note (Signed)
Recommended use of bulk forming laxatives once daily before her largest meal.

## 2014-07-14 NOTE — Assessment & Plan Note (Signed)
I have congratulated her in reduction of   BMI and encouraged  Continued weight loss with goal of 10% of body weigh over the next 6 months using a low glycemic index diet and regular exercise a minimum of 5 days per week.    

## 2014-09-03 ENCOUNTER — Other Ambulatory Visit: Payer: Self-pay | Admitting: Internal Medicine

## 2014-09-03 NOTE — Telephone Encounter (Signed)
Last OV 6.24.16, last refill 7.21.16.  Please advise refill

## 2014-09-05 NOTE — Telephone Encounter (Signed)
Ok to refill,  Refill sent  

## 2014-09-11 ENCOUNTER — Other Ambulatory Visit: Payer: Self-pay | Admitting: *Deleted

## 2014-09-11 MED ORDER — LOSARTAN POTASSIUM 50 MG PO TABS: 50.0000 mg | ORAL_TABLET | Freq: Every day | ORAL | Status: DC

## 2014-10-01 ENCOUNTER — Other Ambulatory Visit: Payer: Self-pay | Admitting: Internal Medicine

## 2014-10-17 ENCOUNTER — Ambulatory Visit (INDEPENDENT_AMBULATORY_CARE_PROVIDER_SITE_OTHER): Payer: BC Managed Care – PPO | Admitting: Internal Medicine

## 2014-10-17 ENCOUNTER — Encounter: Payer: Self-pay | Admitting: Internal Medicine

## 2014-10-17 VITALS — BP 130/72 | HR 78 | Temp 98.2°F | Resp 12 | Wt 206.6 lb

## 2014-10-17 DIAGNOSIS — Z79899 Other long term (current) drug therapy: Secondary | ICD-10-CM

## 2014-10-17 DIAGNOSIS — I10 Essential (primary) hypertension: Secondary | ICD-10-CM | POA: Diagnosis not present

## 2014-10-17 DIAGNOSIS — Z6841 Body Mass Index (BMI) 40.0 and over, adult: Secondary | ICD-10-CM

## 2014-10-17 DIAGNOSIS — F3341 Major depressive disorder, recurrent, in partial remission: Secondary | ICD-10-CM

## 2014-10-17 LAB — COMPREHENSIVE METABOLIC PANEL
ALBUMIN: 4.4 g/dL (ref 3.5–5.2)
ALT: 18 U/L (ref 0–35)
AST: 18 U/L (ref 0–37)
Alkaline Phosphatase: 46 U/L (ref 39–117)
BUN: 26 mg/dL — AB (ref 6–23)
CO2: 32 mEq/L (ref 19–32)
CREATININE: 0.92 mg/dL (ref 0.40–1.20)
Calcium: 9.9 mg/dL (ref 8.4–10.5)
Chloride: 103 mEq/L (ref 96–112)
GFR: 65.51 mL/min (ref >60.00)
Glucose, Bld: 101 mg/dL — ABNORMAL HIGH (ref 70–99)
Potassium: 4.2 mEq/L (ref 3.5–5.1)
Sodium: 141 mEq/L (ref 135–145)
Total Bilirubin: 0.6 mg/dL (ref 0.2–1.2)
Total Protein: 7 g/dL (ref 6.0–8.3)

## 2014-10-17 NOTE — Progress Notes (Signed)
Pre visit review using our clinic review tool, if applicable. No additional management support is needed unless otherwise documented below in the visit note. 

## 2014-10-17 NOTE — Assessment & Plan Note (Signed)
I have congratulated her in reduction of   BMI and encouraged  Continued weight loss with goal of 10% of body weight over the next 6 months using a low glycemic index diet and regular exercise a minimum of 5 days per week.     

## 2014-10-17 NOTE — Assessment & Plan Note (Signed)
Trial of Wellbutrin has been effective in managing depression. No changes today. Marland Kitchen

## 2014-10-17 NOTE — Patient Instructions (Signed)
The  diet I discussed with you today is the 10 day Green Smoothie Cleansing /Detox Diet by JJ Smith . available on [employer] for around $10.  This is not a low carb or a weight loss diet,  It is fundamentally a "cleansing" low fat diet that eliminates sugar, gluten, caffeine, alcohol and dairy for 10 days .  What you add back after the initial ten days is entirely up to  you!  You can expect to lose 5 to 10 lbs depending on how strict you are.   I suggest drinking 2 smoothies daily and keeping one chewable meal (but keep it simple, like baked fish and salad, rice or bok choy) .  You snack primarily on fresh  fruit, egg whites and judicious quantities of nuts. You can add vegetable based protein powder (nothing with whey , since whey is dairy) in it.  [employer] has a great selection .   It does require some form of a nutrient extractor (Vita Mix, a electric juicer,  Or a Nutribullet Rx).  i have found that using frozen fruits is much more convenient and cost effective. You can even find plenty of organic fruit in the frozen fruit section of [employer].  Just thaw what you need for the following day the night before in the refrigerator (to avoid jamming up your machine)     

## 2014-10-17 NOTE — Assessment & Plan Note (Signed)
Well controlled on current regimen. Renal function stable, no changes today.  Lab Results  Component Value Date   CREATININE 0.92 10/17/2014   Lab Results  Component Value Date   NA 141 10/17/2014   K 4.2 10/17/2014   CL 103 10/17/2014   CO2 32 10/17/2014

## 2014-10-17 NOTE — Progress Notes (Signed)
Subjective:  Patient ID: Michele Garza, female    DOB: 09/07/1950  Age: 64 y.o. MRN: [id number]  CC: The primary encounter diagnosis was Long-term use of high-risk medication. Diagnoses of Essential hypertension, Morbid obesity with BMI of 40.0-44.9, adult, and Recurrent major depressive disorder, in partial remission ([place]) were also pertinent to this visit.  HPI [name] presents for follow up on morbid obesity,hypertension  Hyperlipidemia, major depressive disorder  and anxiety,  Has lost 20 lbs in the last 6 months, but only 3 in the last 3 months.  Exercising 2 times per week approximately 30 to 45 minutes using an elliptical , followed by a stationery bike.   She feels good, is following a low carbohydrate diet. She is tolerating her medications without side effects.  She has no active symptoms of depression and is sleeping well.      .  Outpatient Prescriptions Prior to Visit  Medication Sig Dispense Refill  . acyclovir (ZOVIRAX) 400 MG tablet Take 1 tablet (400 mg total) by mouth every 4 (four) hours while awake. 28 tablet 3  . aspirin 81 MG tablet Take 81 mg by mouth daily.      Marland Kitchen atorvastatin (LIPITOR) 20 MG tablet TAKE 1 TABLET (20 MG TOTAL) BY MOUTH DAILY. 90 tablet 3  . buPROPion (WELLBUTRIN XL) 300 MG 24 hr tablet TAKE 1 TABLET (300 MG TOTAL) BY MOUTH DAILY. 30 tablet 5  . calcium citrate-vitamin D (CITRACAL+D) 315-200 MG-UNIT per tablet Take 1 tablet by mouth daily.      Marland Kitchen conjugated estrogens (PREMARIN) vaginal cream 1 to 2 grams intravaginally twice weekly 42.5 g 12  . fish oil-omega-3 fatty acids 1000 MG capsule Take 2 g by mouth daily.    . furosemide (LASIX) 20 MG tablet TAKE 1 TABLET (20 MG TOTAL) BY MOUTH DAILY. (Patient taking differently: 20 mg. TAKE 1 TABLET (20 MG TOTAL) BY MOUTH DAILY.) 30 tablet 5  . losartan (COZAAR) 50 MG tablet Take 1 tablet (50 mg total) by mouth daily. 90 tablet 1  . metoprolol succinate (TOPROL-XL) 25 MG 24 hr tablet TAKE 1 TABLET BY  MOUTH EVERY DAY 90 tablet 2  . Multiple Vitamin (MULTIVITAMIN) tablet Take 1 tablet by mouth daily.       No facility-administered medications prior to visit.    Review of Systems;  Patient denies headache, fevers, malaise, unintentional weight loss, skin rash, eye pain, sinus congestion and sinus pain, sore throat, dysphagia,  hemoptysis , cough, dyspnea, wheezing, chest pain, palpitations, orthopnea, edema, abdominal pain, nausea, melena, diarrhea, constipation, flank pain, dysuria, hematuria, urinary  Frequency, nocturia, numbness, tingling, seizures,  Focal weakness, Loss of consciousness,  Tremor, insomnia, depression, anxiety, and suicidal ideation.      Objective:  BP 130/72 mmHg  Pulse 78  Temp(Src) 98.2 F (36.8 C) (Oral)  Resp 12  Wt 206 lb 9.6 oz (93.713 kg)  SpO2 98%  BP Readings from Last 3 Encounters:  10/17/14 130/72  07/11/14 124/80  03/31/14 138/84    Wt Readings from Last 3 Encounters:  10/17/14 206 lb 9.6 oz (93.713 kg)  07/11/14 209 lb (94.802 kg)  03/31/14 226 lb (102.513 kg)    General appearance: alert, cooperative and appears stated age Ears: normal TM's and external ear canals both ears Throat: lips, mucosa, and tongue normal; teeth and gums normal Neck: no adenopathy, no carotid bruit, supple, symmetrical, trachea midline and thyroid not enlarged, symmetric, no tenderness/mass/nodules Back: symmetric, no curvature. ROM normal. No CVA tenderness. Lungs: clear  to auscultation bilaterally Heart: regular rate and rhythm, S1, S2 normal, no murmur, click, rub or gallop Abdomen: soft, non-tender; bowel sounds normal; no masses,  no organomegaly Pulses: 2+ and symmetric Skin: Skin color, texture, turgor normal. No rashes or lesions Lymph nodes: Cervical, supraclavicular, and axillary nodes normal.  Lab Results  Component Value Date   HGBA1C 5.7 03/31/2014   HGBA1C 5.6 02/28/2013    Lab Results  Component Value Date   CREATININE 0.92 10/17/2014    CREATININE 0.76 03/31/2014   CREATININE 1.2 02/28/2013    Lab Results  Component Value Date   WBC 6.2 03/31/2014   HGB 13.7 03/31/2014   HCT 40.0 03/31/2014   PLT 202.0 03/31/2014   GLUCOSE 101* 10/17/2014   CHOL 162 03/31/2014   TRIG 53.0 03/31/2014   HDL 54.00 03/31/2014   LDLDIRECT 171.2 02/28/2013   LDLCALC 97 03/31/2014   ALT 18 10/17/2014   AST 18 10/17/2014   NA 141 10/17/2014   K 4.2 10/17/2014   CL 103 10/17/2014   CREATININE 0.92 10/17/2014   BUN 26* 10/17/2014   CO2 32 10/17/2014   TSH 0.65 03/31/2014   HGBA1C 5.7 03/31/2014    No results found.  Assessment & Plan:   Problem List Items Addressed This Visit    Hypertension    Well controlled on current regimen. Renal function stable, no changes today.  Lab Results  Component Value Date   CREATININE 0.92 10/17/2014   Lab Results  Component Value Date   NA 141 10/17/2014   K 4.2 10/17/2014   CL 103 10/17/2014   CO2 32 10/17/2014         Major depressive disorder in partial remission ([place])    Trial of Wellbutrin has been effective in managing depression. No changes today. .        Morbid obesity with BMI of 40.0-44.9, adult    I have congratulated her in reduction of   BMI and encouraged  Continued weight loss with goal of 10% of body weight over the next 6 months using a low glycemic index diet and regular exercise a minimum of 5 days per week.         Other Visit Diagnoses    Long-term use of high-risk medication    -  Primary    Relevant Orders    Comp Met (CMET) (Completed)      A total of 25 minutes of face to face time was spent with patient more than half of which was spent in counselling about the above mentioned conditions  and coordination of care   I am having Ms. Gora maintain her multivitamin, aspirin, calcium citrate-vitamin D, fish oil-omega-3 fatty acids, furosemide, acyclovir, atorvastatin, conjugated estrogens, buPROPion, losartan, and metoprolol succinate.  No orders  of the defined types were placed in this encounter.    There are no discontinued medications.  Follow-up: Return in about 3 months (around 01/16/2015).   Crecencio Mc, MD

## 2014-10-19 ENCOUNTER — Encounter: Payer: Self-pay | Admitting: Internal Medicine

## 2015-02-21 ENCOUNTER — Other Ambulatory Visit: Payer: Self-pay | Admitting: Internal Medicine

## 2015-03-25 ENCOUNTER — Other Ambulatory Visit: Payer: Self-pay | Admitting: Internal Medicine

## 2015-04-03 ENCOUNTER — Ambulatory Visit (INDEPENDENT_AMBULATORY_CARE_PROVIDER_SITE_OTHER): Payer: BC Managed Care – PPO | Admitting: Internal Medicine

## 2015-04-03 ENCOUNTER — Encounter: Payer: Self-pay | Admitting: Internal Medicine

## 2015-04-03 ENCOUNTER — Encounter: Payer: BC Managed Care – PPO | Admitting: Internal Medicine

## 2015-04-03 VITALS — BP 132/78 | HR 70 | Temp 98.1°F | Resp 12 | Ht 62.0 in | Wt 207.0 lb

## 2015-04-03 DIAGNOSIS — F324 Major depressive disorder, single episode, in partial remission: Secondary | ICD-10-CM | POA: Diagnosis not present

## 2015-04-03 DIAGNOSIS — E785 Hyperlipidemia, unspecified: Secondary | ICD-10-CM

## 2015-04-03 DIAGNOSIS — R5383 Other fatigue: Secondary | ICD-10-CM | POA: Diagnosis not present

## 2015-04-03 DIAGNOSIS — I10 Essential (primary) hypertension: Secondary | ICD-10-CM

## 2015-04-03 DIAGNOSIS — Z6841 Body Mass Index (BMI) 40.0 and over, adult: Secondary | ICD-10-CM

## 2015-04-03 DIAGNOSIS — Z8619 Personal history of other infectious and parasitic diseases: Secondary | ICD-10-CM | POA: Diagnosis not present

## 2015-04-03 DIAGNOSIS — E559 Vitamin D deficiency, unspecified: Secondary | ICD-10-CM | POA: Diagnosis not present

## 2015-04-03 DIAGNOSIS — Z0000 Encounter for general adult medical examination without abnormal findings: Secondary | ICD-10-CM

## 2015-04-03 DIAGNOSIS — Z7289 Other problems related to lifestyle: Secondary | ICD-10-CM

## 2015-04-03 LAB — CBC WITH DIFFERENTIAL/PLATELET
BASOS PCT: 0 % (ref 0–1)
Basophils Absolute: 0 10*3/uL (ref 0.0–0.1)
Eosinophils Absolute: 0.1 10*3/uL (ref 0.0–0.7)
Eosinophils Relative: 1 % (ref 0–5)
HEMATOCRIT: 41.1 % (ref 36.0–46.0)
HEMOGLOBIN: 14.1 g/dL (ref 12.0–15.0)
Lymphocytes Relative: 35 % (ref 12–46)
Lymphs Abs: 2.8 10*3/uL (ref 0.7–4.0)
MCH: 31.6 pg (ref 26.0–34.0)
MCHC: 34.3 g/dL (ref 30.0–36.0)
MCV: 92.2 fL (ref 78.0–100.0)
MONO ABS: 0.6 10*3/uL (ref 0.1–1.0)
MONOS PCT: 7 % (ref 3–12)
MPV: 11.8 fL (ref 8.6–12.4)
NEUTROS ABS: 4.5 10*3/uL (ref 1.7–7.7)
Neutrophils Relative %: 57 % (ref 43–77)
Platelets: 217 10*3/uL (ref 150–400)
RBC: 4.46 MIL/uL (ref 3.87–5.11)
RDW: 12.9 % (ref 11.5–15.5)
WBC: 7.9 10*3/uL (ref 4.0–10.5)

## 2015-04-03 MED ORDER — PHENTERMINE HCL 37.5 MG PO TABS: ORAL_TABLET | ORAL | Status: DC

## 2015-04-03 MED ORDER — ACYCLOVIR 400 MG PO TABS: 400.0000 mg | ORAL_TABLET | ORAL | Status: DC

## 2015-04-03 MED ORDER — NALTREXONE-BUPROPION HCL ER 8-90 MG PO TB12: ORAL_TABLET | ORAL | Status: DC

## 2015-04-03 NOTE — Progress Notes (Signed)
Pre-visit discussion using our clinic review tool. No additional management support is needed unless otherwise documented below in the visit note.  

## 2015-04-03 NOTE — Progress Notes (Signed)
Patient ID: Michele Garza, female    DOB: 1950-05-03  Age: 65 y.o. MRN: [id number]  The patient is here for annual wellness examination and management of other chronic and acute problems.    Mammogram due in April      The risk factors are reflected in the social history.  The roster of all physicians providing medical care to patient - is listed in the Snapshot section of the chart.  Activities of daily living:  The patient is 100% independent in all ADLs: dressing, toileting, feeding as well as independent mobility  Home safety : The patient has smoke detectors in the home. They wear seatbelts.  There are no firearms at home. There is no violence in the home.   There is no risks for hepatitis, STDs or HIV. There is no   history of blood transfusion. They have no travel history to infectious disease endemic areas of the world.  The patient has seen their dentist in the last six month. They have seen their eye doctor in the last year. They admit to slight hearing difficulty with regard to whispered voices and some television programs.  They have deferred audiologic testing in the last year.  They do not  have excessive sun exposure. Discussed the need for sun protection: hats, long sleeves and use of sunscreen if there is significant sun exposure.   Diet: the importance of a healthy diet is discussed. They do have a healthy diet.  The benefits of regular aerobic exercise were discussed. She walks 4 times per week ,  20 minutes.   Depression screen: there are no signs or vegative symptoms of depression- irritability, change in appetite, anhedonia, sadness/tearfullness.  Cognitive assessment: the patient manages all their financial and personal affairs and is actively engaged. They could relate day,date,year and events; recalled 2/3 objects at 3 minutes; performed clock-face test normally.  The following portions of the patient's history were reviewed and updated as appropriate: allergies,  current medications, past family history, past medical history,  past surgical history, past social history  and problem list.  Visual acuity was not assessed per patient preference since she has regular follow up with her ophthalmologist. Hearing and body mass index were assessed and reviewed.   During the course of the visit the patient was educated and counseled about appropriate screening and preventive services including : fall prevention , diabetes screening, nutrition counseling, colorectal cancer screening, and recommended immunizations.    CC: The primary encounter diagnosis was History of herpes zoster. Diagnoses of Essential hypertension, Hyperlipidemia, Morbid obesity with BMI of 40.0-44.9, adult ([place]), Other fatigue, Vitamin D deficiency, Other problems related to lifestyle, Visit for preventive health examination, and Major depressive disorder with single episode, in partial remission [facility]) were also pertinent to this visit.  Wants to reduce wellbutrin dose to 150 mg daily Unable to lose weight after the 27 lb wt loss , wants to try phentermine.  She is exercising 3 days per week,  60 minutes total"  20 minutes on the [employer] , then uses the bike    History Michele Garza has a past medical history of Hypertension; History of colonoscopy; Diverticulosis of colon; GERD (gastroesophageal reflux disease); and Depression.   She has past surgical history that includes Breast surgery.   Her family history includes Cancer in her father, maternal uncle, and mother; Cancer (age of onset: 75) in her paternal grandmother; Heart disease (age of onset: 82) in her father; Stroke in her father.She reports that she has never  smoked. She has never used smokeless tobacco. She reports that she drinks about 0.6 oz of alcohol per week. She reports that she does not use illicit drugs.  Outpatient Prescriptions Prior to Visit  Medication Sig Dispense Refill  . aspirin 81 MG tablet Take 81 mg by mouth daily.       Marland Kitchen atorvastatin (LIPITOR) 20 MG tablet TAKE 1 TABLET BY MOUTH EVERY DAY 90 tablet 3  . buPROPion (WELLBUTRIN XL) 300 MG 24 hr tablet TAKE 1 TABLET (300 MG TOTAL) BY MOUTH DAILY. 30 tablet 5  . calcium citrate-vitamin D (CITRACAL+D) 315-200 MG-UNIT per tablet Take 1 tablet by mouth daily.      Marland Kitchen conjugated estrogens (PREMARIN) vaginal cream 1 to 2 grams intravaginally twice weekly 42.5 g 12  . fish oil-omega-3 fatty acids 1000 MG capsule Take 2 g by mouth daily.    . furosemide (LASIX) 20 MG tablet TAKE 1 TABLET (20 MG TOTAL) BY MOUTH DAILY. (Patient taking differently: 20 mg. TAKE 1 TABLET (20 MG TOTAL) BY MOUTH DAILY.) 30 tablet 5  . losartan (COZAAR) 50 MG tablet Take 1 tablet (50 mg total) by mouth daily. 90 tablet 1  . metoprolol succinate (TOPROL-XL) 25 MG 24 hr tablet TAKE 1 TABLET BY MOUTH EVERY DAY 90 tablet 2  . Multiple Vitamin (MULTIVITAMIN) tablet Take 1 tablet by mouth daily.      Marland Kitchen acyclovir (ZOVIRAX) 400 MG tablet Take 1 tablet (400 mg total) by mouth every 4 (four) hours while awake. 28 tablet 3   No facility-administered medications prior to visit.    Review of Systems   Patient denies headache, fevers, malaise, unintentional weight loss, skin rash, eye pain, sinus congestion and sinus pain, sore throat, dysphagia,  hemoptysis , cough, dyspnea, wheezing, chest pain, palpitations, orthopnea, edema, abdominal pain, nausea, melena, diarrhea, constipation, flank pain, dysuria, hematuria, urinary  Frequency, nocturia, numbness, tingling, seizures,  Focal weakness, Loss of consciousness,  Tremor, insomnia, depression, anxiety, and suicidal ideation.     Objective:  BP 132/78 mmHg  Pulse 70  Temp(Src) 98.1 F (36.7 C) (Oral)  Resp 12  Ht '5\' 2"'  (1.575 m)  Wt 207 lb (93.895 kg)  BMI 37.85 kg/m2  SpO2 99%  Physical Exam   .General appearance: alert, cooperative and appears stated age Head: Normocephalic, without obvious abnormality, atraumatic Eyes: conjunctivae/corneas  clear. PERRL, EOM's intact. Fundi benign. Ears: normal TM's and external ear canals both ears Nose: Nares normal. Septum midline. Mucosa normal. No drainage or sinus tenderness. Throat: lips, mucosa, and tongue normal; teeth and gums normal Neck: no adenopathy, no carotid bruit, no JVD, supple, symmetrical, trachea midline and thyroid not enlarged, symmetric, no tenderness/mass/nodules Lungs: clear to auscultation bilaterally Breasts: normal appearance, no masses or tenderness Heart: regular rate and rhythm, S1, S2 normal, no murmur, click, rub or gallop Abdomen: soft, non-tender; bowel sounds normal; no masses,  no organomegaly Extremities: extremities normal, atraumatic, no cyanosis or edema Pulses: 2+ and symmetric Skin: Skin color, texture, turgor normal. No rashes or lesions Neurologic: Alert and oriented X 3, normal strength and tone. Normal symmetric reflexes. Normal coordination and gait.     Assessment & Plan:   Problem List Items Addressed This Visit    Hypertension    Well controlled on current regimen. Renal function stable, no changes today.  Lab Results  Component Value Date   CREATININE 0.96 04/03/2015   Lab Results  Component Value Date   NA 139 04/03/2015   K 4.3 04/03/2015   CL  100 04/03/2015   CO2 25 04/03/2015         Relevant Orders   CBC with Differential/Platelet (Completed)   Comp Met (CMET) (Completed)   Lipid panel (Completed)   TSH (Completed)   VITAMIN D 25 Hydroxy (Vit-D Deficiency, Fractures) (Completed)   Major depressive disorder in partial remission ([place])    Symptoms have improved.  Will reduce her dose to 150 mg daily , but the reduce dose may be in the form of Contrave given her concurrent need for management of obesity.       Morbid obesity with BMI of 40.0-44.9, adult ([place])    I have addressed  BMI and recommended wt loss of 10% of body weight over the next 6 months using a low fat, low starch, high protein  fruit/vegetable based  Mediterranean diet and 30 minutes of aerobic exercise a minimum of 5 days per week. She has had difficulty losing weight after a 27 lb loss due to increased appetite and is requesting a trial of  Phentermine.  Since she is already taking wellbutrin and requesting a lowered dose,  Contrave would be a better choice because it can be used long term and would include a daily dose of wellbutrin that  Would be equivalent ot the lower dose of wellbutrin requested.  If it is not covered by her insurance,  We will initiate a trial of phentermine.  She is aware of the possible side effects and risks and understands that    The medication will be discontinued if she has not lost 5% of her body weight over the next 3 months, which , based on today's weight is 11 lbs.       Relevant Medications   Naltrexone-Bupropion HCl ER (CONTRAVE) 8-90 MG TB12   phentermine (ADIPEX-P) 37.5 MG tablet   Other Relevant Orders   CBC with Differential/Platelet (Completed)   Comp Met (CMET) (Completed)   Lipid panel (Completed)   TSH (Completed)   VITAMIN D 25 Hydroxy (Vit-D Deficiency, Fractures) (Completed)   Visit for preventive health examination    Annual comprehensive preventive exam was done as well as an evaluation and management of chronic conditions .  During the course of the visit the patient was educated and counseled about appropriate screening and preventive services including :  diabetes screening, lipid analysis with projected  10 year  risk for CAD , nutrition counseling, breast, cervical and colorectal cancer screening, and recommended immunizations.  Printed recommendations for health maintenance screenings was give      Hyperlipidemia    She was started on atorvastatin for  Mitigation of CAD risk based on ACC guidelines .  Lfts are normal ,  No changes today   Lab Results  Component Value Date   CHOL 194 04/03/2015   HDL 66 04/03/2015   LDLCALC 117 04/03/2015   LDLDIRECT 171.2 02/28/2013   TRIG 56  04/03/2015   CHOLHDL 2.9 04/03/2015   Lab Results  Component Value Date   ALT 19 04/03/2015   AST 21 04/03/2015   ALKPHOS 44 04/03/2015   BILITOT 0.7 04/03/2015            Relevant Orders   CBC with Differential/Platelet (Completed)   Comp Met (CMET) (Completed)   Lipid panel (Completed)   TSH (Completed)   VITAMIN D 25 Hydroxy (Vit-D Deficiency, Fractures) (Completed)   History of herpes zoster - Primary   Relevant Medications   acyclovir (ZOVIRAX) 400 MG tablet   Other Relevant Orders  CBC with Differential/Platelet (Completed)   Comp Met (CMET) (Completed)   Lipid panel (Completed)   TSH (Completed)   VITAMIN D 25 Hydroxy (Vit-D Deficiency, Fractures) (Completed)    Other Visit Diagnoses    Other fatigue        Relevant Orders    CBC with Differential/Platelet (Completed)    Comp Met (CMET) (Completed)    Lipid panel (Completed)    TSH (Completed)    VITAMIN D 25 Hydroxy (Vit-D Deficiency, Fractures) (Completed)    Vitamin D deficiency        Relevant Orders    CBC with Differential/Platelet (Completed)    Comp Met (CMET) (Completed)    Lipid panel (Completed)    TSH (Completed)    VITAMIN D 25 Hydroxy (Vit-D Deficiency, Fractures) (Completed)    Other problems related to lifestyle        Relevant Orders    Hepatitis C antibody (Completed)    HIV antibody (Completed)    CBC with Differential/Platelet (Completed)    Comp Met (CMET) (Completed)    Lipid panel (Completed)    TSH (Completed)    VITAMIN D 25 Hydroxy (Vit-D Deficiency, Fractures) (Completed)       I am having Michele Garza start on Naltrexone-Bupropion HCl ER and phentermine. I am also having her maintain her multivitamin, aspirin, calcium citrate-vitamin D, fish oil-omega-3 fatty acids, furosemide, conjugated estrogens, losartan, metoprolol succinate, buPROPion, atorvastatin, and acyclovir.  Meds ordered this encounter  Medications  . acyclovir (ZOVIRAX) 400 MG tablet    Sig: Take 1 tablet  (400 mg total) by mouth every 4 (four) hours while awake.    Dispense:  28 tablet    Refill:  3  . Naltrexone-Bupropion HCl ER (CONTRAVE) 8-90 MG TB12    Sig: One tablet daily (Week 1) ; one tablet twice daily (Week 2); 2 tablets in AM and 1 Pm (Week 3) ,  2 tablets twice daily Week 4    Dispense:  70 tablet    Refill:  0  . phentermine (ADIPEX-P) 37.5 MG tablet    Sig: 1/2 tablet in the am and early afternoon    Dispense:  30 tablet    Refill:  2    Medications Discontinued During This Encounter  Medication Reason  . acyclovir (ZOVIRAX) 400 MG tablet Reorder    Follow-up: No Follow-up on file.   Crecencio Mc, MD

## 2015-04-03 NOTE — Patient Instructions (Addendum)
If you start  The Contrave (wellbutrin/naltrexone combination) medication.  You will need to continue your 300 mg of wellbutrin daily for the first week,  Then reduce it to  every other day for the 2nd week,  Then stop the wellbutrin  The  Contrave dose is increased gradually over 4 weeks to 2 tablet twice daily  So the final daily dose of wellbutrin in the medication is 180 mg twice daily  (which is similar to 150 mg of the XR)   If it is too expensive,  I have given you the rx for phentermine  If you start the phentermine,  We will reduce your wellbutrin dose to 150 mg daily with a new rx.  Get your BP checked if you start the phentermine    Return for fasting labs

## 2015-04-04 LAB — COMPREHENSIVE METABOLIC PANEL
ALT: 19 U/L (ref 6–29)
AST: 21 U/L (ref 10–35)
Albumin: 4.5 g/dL (ref 3.6–5.1)
Alkaline Phosphatase: 44 U/L (ref 33–130)
BILIRUBIN TOTAL: 0.7 mg/dL (ref 0.2–1.2)
BUN: 24 mg/dL (ref 7–25)
CALCIUM: 9.8 mg/dL (ref 8.6–10.4)
CO2: 25 mmol/L (ref 20–31)
Chloride: 100 mmol/L (ref 98–110)
Creat: 0.96 mg/dL (ref 0.50–0.99)
GLUCOSE: 80 mg/dL (ref 65–99)
POTASSIUM: 4.3 mmol/L (ref 3.5–5.3)
Sodium: 139 mmol/L (ref 135–146)
Total Protein: 6.9 g/dL (ref 6.1–8.1)

## 2015-04-04 LAB — LIPID PANEL
CHOL/HDL RATIO: 2.9 ratio (ref <=5.0)
Cholesterol: 194 mg/dL (ref 125–200)
HDL: 66 mg/dL (ref >=46)
LDL CALC: 117 mg/dL (ref <130)
Triglycerides: 56 mg/dL (ref <150)
VLDL: 11 mg/dL (ref <30)

## 2015-04-04 LAB — HIV ANTIBODY (ROUTINE TESTING W REFLEX): HIV: NONREACTIVE

## 2015-04-04 LAB — TSH: TSH: 0.66 m[IU]/L

## 2015-04-04 LAB — VITAMIN D 25 HYDROXY (VIT D DEFICIENCY, FRACTURES): VIT D 25 HYDROXY: 43 ng/mL (ref 30–100)

## 2015-04-04 LAB — HEPATITIS C ANTIBODY: HCV AB: NEGATIVE

## 2015-04-05 ENCOUNTER — Encounter: Payer: Self-pay | Admitting: Internal Medicine

## 2015-04-05 NOTE — Assessment & Plan Note (Signed)
Well controlled on current regimen. Renal function stable, no changes today.  Lab Results  Component Value Date   CREATININE 0.96 04/03/2015   Lab Results  Component Value Date   NA 139 04/03/2015   K 4.3 04/03/2015   CL 100 04/03/2015   CO2 25 04/03/2015

## 2015-04-05 NOTE — Assessment & Plan Note (Signed)
Symptoms have improved.  Will reduce her dose to 150 mg daily , but the reduce dose may be in the form of Contrave given her concurrent need for management of obesity.

## 2015-04-05 NOTE — Assessment & Plan Note (Signed)
Annual comprehensive preventive exam was done as well as an evaluation and management of chronic conditions .  During the course of the visit the patient was educated and counseled about appropriate screening and preventive services including :  diabetes screening, lipid analysis with projected  10 year  risk for CAD , nutrition counseling, breast, cervical and colorectal cancer screening, and recommended immunizations.  Printed recommendations for health maintenance screenings was give 

## 2015-04-05 NOTE — Assessment & Plan Note (Signed)
I have addressed  BMI and recommended wt loss of 10% of body weight over the next 6 months using a low fat, low starch, high protein  fruit/vegetable based Mediterranean diet and 30 minutes of aerobic exercise a minimum of 5 days per week. She has had difficulty losing weight after a 27 lb loss due to increased appetite and is requesting a trial of  Phentermine.  Since she is already taking wellbutrin and requesting a lowered dose,  Contrave would be a better choice because it can be used long term and would include a daily dose of wellbutrin that  Would be equivalent ot the lower dose of wellbutrin requested.  If it is not covered by her insurance,  We will initiate a trial of phentermine.  She is aware of the possible side effects and risks and understands that    The medication will be discontinued if she has not lost 5% of her body weight over the next 3 months, which , based on today's weight is 11 lbs.

## 2015-04-05 NOTE — Assessment & Plan Note (Addendum)
She was started on atorvastatin for  Mitigation of CAD risk based on ACC guidelines .  Lfts are normal ,  No changes today   Lab Results  Component Value Date   CHOL 194 04/03/2015   HDL 66 04/03/2015   LDLCALC 117 04/03/2015   LDLDIRECT 171.2 02/28/2013   TRIG 56 04/03/2015   CHOLHDL 2.9 04/03/2015   Lab Results  Component Value Date   ALT 19 04/03/2015   AST 21 04/03/2015   ALKPHOS 44 04/03/2015   BILITOT 0.7 04/03/2015

## 2015-04-06 ENCOUNTER — Encounter: Payer: Self-pay | Admitting: Internal Medicine

## 2015-04-06 ENCOUNTER — Other Ambulatory Visit: Payer: Self-pay | Admitting: Internal Medicine

## 2015-04-06 MED ORDER — BUPROPION HCL ER (XL) 150 MG PO TB24: 150.0000 mg | ORAL_TABLET | Freq: Every day | ORAL | Status: DC

## 2015-04-06 NOTE — Telephone Encounter (Signed)
Patient is using the phentermine.

## 2015-04-21 ENCOUNTER — Encounter: Payer: Self-pay | Admitting: Internal Medicine

## 2015-05-01 LAB — HM MAMMOGRAPHY

## 2015-05-05 ENCOUNTER — Encounter: Payer: Self-pay | Admitting: Internal Medicine

## 2015-06-01 ENCOUNTER — Other Ambulatory Visit: Payer: Self-pay | Admitting: Internal Medicine

## 2015-06-16 ENCOUNTER — Other Ambulatory Visit: Payer: Self-pay | Admitting: Internal Medicine

## 2015-06-22 ENCOUNTER — Other Ambulatory Visit: Payer: Self-pay | Admitting: Internal Medicine

## 2015-07-03 ENCOUNTER — Ambulatory Visit: Payer: Self-pay | Admitting: Internal Medicine

## 2015-07-10 ENCOUNTER — Encounter: Payer: Self-pay | Admitting: Internal Medicine

## 2015-07-10 ENCOUNTER — Ambulatory Visit (INDEPENDENT_AMBULATORY_CARE_PROVIDER_SITE_OTHER): Payer: BC Managed Care – PPO | Admitting: Internal Medicine

## 2015-07-10 VITALS — BP 128/82 | HR 108 | Temp 98.1°F | Resp 12 | Ht 62.0 in | Wt 202.0 lb

## 2015-07-10 DIAGNOSIS — Z6841 Body Mass Index (BMI) 40.0 and over, adult: Secondary | ICD-10-CM

## 2015-07-10 DIAGNOSIS — F324 Major depressive disorder, single episode, in partial remission: Secondary | ICD-10-CM

## 2015-07-10 MED ORDER — NALTREXONE-BUPROPION HCL ER 8-90 MG PO TB12: ORAL_TABLET | ORAL | Status: DC

## 2015-07-10 MED ORDER — PHENTERMINE HCL 37.5 MG PO TABS: ORAL_TABLET | ORAL | Status: DC

## 2015-07-10 NOTE — Progress Notes (Signed)
Subjective:  Patient ID: Michele Garza, female    DOB: 12-30-1950  Age: 65 y.o. MRN: [id number]  CC: The primary encounter diagnosis was Morbid obesity with BMI of 40.0-44.9, adult ([place]). A diagnosis of Major depressive disorder with single episode, in partial remission [facility]) was also pertinent to this visit.  HPI [name] presents for follow up on obesity with recent initiation of phentermine therapy for assistance in weight loss .  She has been taking the medication since march and has lost 5 lbs.   Goal was 10 lbs  Tolerating medication.  Felt  "jacked up" on it initially,  But eventually  became accustomed to it.  Has forgotten it once a week on average. Not weighing self, but feels a difference in the way her clothes are fitting.  She is exercising  2 or 3 times per week, never more than 3 days per week.  She feels she is overeating on the weekends.  Familial responsibilities monitoring and caring for her aging mother  Who lives in [place] and recently broker her arm .  No assistance from tow brothers Works full time.  Diet reviewed in detail.  Advice given.  Exercise routine reviewed.   Outpatient Prescriptions Prior to Visit  Medication Sig Dispense Refill  . acyclovir (ZOVIRAX) 400 MG tablet Take 1 tablet (400 mg total) by mouth every 4 (four) hours while awake. 28 tablet 3  . aspirin 81 MG tablet Take 81 mg by mouth daily.      Marland Kitchen atorvastatin (LIPITOR) 20 MG tablet TAKE 1 TABLET BY MOUTH EVERY DAY 90 tablet 3  . buPROPion (WELLBUTRIN XL) 150 MG 24 hr tablet TAKE 1 TABLET (150 MG TOTAL) BY MOUTH DAILY. 30 tablet 3  . calcium citrate-vitamin D (CITRACAL+D) 315-200 MG-UNIT per tablet Take 1 tablet by mouth daily.      Marland Kitchen conjugated estrogens (PREMARIN) vaginal cream 1 to 2 grams intravaginally twice weekly 42.5 g 12  . fish oil-omega-3 fatty acids 1000 MG capsule Take 2 g by mouth daily.    Marland Kitchen losartan (COZAAR) 50 MG tablet TAKE 1 TABLET (50 MG TOTAL) BY MOUTH DAILY. 90 tablet 1   . metoprolol succinate (TOPROL-XL) 25 MG 24 hr tablet TAKE 1 TABLET BY MOUTH EVERY DAY 90 tablet 2  . Multiple Vitamin (MULTIVITAMIN) tablet Take 1 tablet by mouth daily.      . phentermine (ADIPEX-P) 37.5 MG tablet 1/2 tablet in the am and early afternoon 30 tablet 2  . furosemide (LASIX) 20 MG tablet TAKE 1 TABLET (20 MG TOTAL) BY MOUTH DAILY. (Patient not taking: Reported on 07/10/2015) 30 tablet 5   No facility-administered medications prior to visit.    Review of Systems;  Patient denies headache, fevers, malaise, unintentional weight loss, skin rash, eye pain, sinus congestion and sinus pain, sore throat, dysphagia,  hemoptysis , cough, dyspnea, wheezing, chest pain, palpitations, orthopnea, edema, abdominal pain, nausea, melena, diarrhea, constipation, flank pain, dysuria, hematuria, urinary  Frequency, nocturia, numbness, tingling, seizures,  Focal weakness, Loss of consciousness,  Tremor, insomnia, depression, anxiety, and suicidal ideation.      Objective:  BP 128/82 mmHg  Pulse 108  Temp(Src) 98.1 F (36.7 C) (Oral)  Resp 12  Ht 5\' 2"  (1.575 m)  Wt 202 lb (91.627 kg)  BMI 36.94 kg/m2  SpO2 97%  BP Readings from Last 3 Encounters:  07/10/15 128/82  04/03/15 132/78  10/17/14 130/72    Wt Readings from Last 3 Encounters:  07/10/15 202 lb (91.627  kg)  04/03/15 207 lb (93.895 kg)  10/17/14 206 lb 9.6 oz (93.713 kg)    General appearance: alert, cooperative and appears stated age Ears: normal TM's and external ear canals both ears Throat: lips, mucosa, and tongue normal; teeth and gums normal Neck: no adenopathy, no carotid bruit, supple, symmetrical, trachea midline and thyroid not enlarged, symmetric, no tenderness/mass/nodules Back: symmetric, no curvature. ROM normal. No CVA tenderness. Lungs: clear to auscultation bilaterally Heart: regular rate and rhythm, S1, S2 normal, no murmur, click, rub or gallop Abdomen: soft, non-tender; bowel sounds normal; no masses,   no organomegaly Pulses: 2+ and symmetric Skin: Skin color, texture, turgor normal. No rashes or lesions Lymph nodes: Cervical, supraclavicular, and axillary nodes normal.  Lab Results  Component Value Date   HGBA1C 5.7 03/31/2014   HGBA1C 5.6 02/28/2013    Lab Results  Component Value Date   CREATININE 0.96 04/03/2015   CREATININE 0.92 10/17/2014   CREATININE 0.76 03/31/2014    Lab Results  Component Value Date   WBC 7.9 04/03/2015   HGB 14.1 04/03/2015   HCT 41.1 04/03/2015   PLT 217 04/03/2015   GLUCOSE 80 04/03/2015   CHOL 194 04/03/2015   TRIG 56 04/03/2015   HDL 66 04/03/2015   LDLDIRECT 171.2 02/28/2013   LDLCALC 117 04/03/2015   ALT 19 04/03/2015   AST 21 04/03/2015   NA 139 04/03/2015   K 4.3 04/03/2015   CL 100 04/03/2015   CREATININE 0.96 04/03/2015   BUN 24 04/03/2015   CO2 25 04/03/2015   TSH 0.66 04/03/2015   HGBA1C 5.7 03/31/2014    No results found.  Assessment & Plan:   Problem List Items Addressed This Visit    Major depressive disorder in partial remission ([place])    Currently in remission on wellbutrin .  Discussed when to discontinue medication if  Contrave is started       Morbid obesity with BMI of 40.0-44.9, adult ([place]) - Primary    Did not reach goal of 10 lbs on phentermine after 3 months.  Discussed need to increase her exercise to 30 minutes with a goal of  5 days per week regardless of pharmacotherapy.  Discussed trial of Contrave      Relevant Medications   Naltrexone-Bupropion HCl ER 8-90 MG TB12   phentermine (ADIPEX-P) 37.5 MG tablet     A total of 25 minutes of face to face time was spent with patient more than half of which was spent in counselling  On weight loss through diet and exercise, and coordination of care  I am having Ms. Menting start on Naltrexone-Bupropion HCl ER. I am also having her maintain her multivitamin, aspirin, calcium citrate-vitamin D, fish oil-omega-3 fatty acids, furosemide, conjugated estrogens,  atorvastatin, acyclovir, buPROPion, losartan, metoprolol succinate, and phentermine.  Meds ordered this encounter  Medications  . Naltrexone-Bupropion HCl ER 8-90 MG TB12    Sig: One tablet every morning for one week, then twice daily for one week.. Increase gradually to 2 tablets twice daily    Dispense:  120 tablet    Refill:  0  . phentermine (ADIPEX-P) 37.5 MG tablet    Sig: 1/2 tablet in the am and early afternoon    Dispense:  30 tablet    Refill:  0    Medications Discontinued During This Encounter  Medication Reason  . phentermine (ADIPEX-P) 37.5 MG tablet Reorder  . phentermine (ADIPEX-P) 37.5 MG tablet Reorder    Follow-up: Return in about 3 months (  around 10/10/2015).   Crecencio Mc, MD

## 2015-07-10 NOTE — Patient Instructions (Addendum)
You can continue the phentermine for now until we get an alternative approved and filled.   If the Contrave gets approved ,   Start it and continue your wellbutrin for the first week  Week 2:  You will continue Wellbutrin until half way through the week,   Try to get  30 minutes of exercise  5 days per week !

## 2015-07-10 NOTE — Progress Notes (Signed)
Pre-visit discussion using our clinic review tool. No additional management support is needed unless otherwise documented below in the visit note.  

## 2015-07-12 NOTE — Assessment & Plan Note (Signed)
Currently in remission on wellbutrin .  Discussed when to discontinue medication if  Contrave is started

## 2015-07-12 NOTE — Assessment & Plan Note (Signed)
Did not reach goal of 10 lbs on phentermine after 3 months.  Discussed need to increase her exercise to 30 minutes with a goal of  5 days per week regardless of pharmacotherapy.  Discussed trial of Contrave

## 2015-07-16 ENCOUNTER — Telehealth: Payer: Self-pay

## 2015-07-16 NOTE — Telephone Encounter (Signed)
Approved from 07/13/2015-11/12/2015.

## 2015-08-27 ENCOUNTER — Other Ambulatory Visit: Payer: Self-pay | Admitting: Internal Medicine

## 2015-10-16 ENCOUNTER — Ambulatory Visit: Payer: BC Managed Care – PPO | Admitting: Internal Medicine

## 2015-11-06 ENCOUNTER — Ambulatory Visit: Payer: BC Managed Care – PPO | Admitting: Internal Medicine

## 2015-12-11 ENCOUNTER — Other Ambulatory Visit: Payer: Self-pay | Admitting: Internal Medicine

## 2015-12-21 ENCOUNTER — Encounter: Payer: Self-pay | Admitting: Internal Medicine

## 2015-12-21 DIAGNOSIS — E559 Vitamin D deficiency, unspecified: Secondary | ICD-10-CM

## 2015-12-21 DIAGNOSIS — I10 Essential (primary) hypertension: Secondary | ICD-10-CM

## 2015-12-21 DIAGNOSIS — E785 Hyperlipidemia, unspecified: Secondary | ICD-10-CM

## 2015-12-22 ENCOUNTER — Other Ambulatory Visit: Payer: Self-pay | Admitting: Internal Medicine

## 2015-12-25 ENCOUNTER — Ambulatory Visit: Payer: BC Managed Care – PPO | Admitting: Internal Medicine

## 2015-12-30 ENCOUNTER — Other Ambulatory Visit: Payer: Self-pay | Admitting: Internal Medicine

## 2016-01-15 ENCOUNTER — Other Ambulatory Visit (INDEPENDENT_AMBULATORY_CARE_PROVIDER_SITE_OTHER): Payer: BC Managed Care – PPO

## 2016-01-15 DIAGNOSIS — E785 Hyperlipidemia, unspecified: Secondary | ICD-10-CM | POA: Diagnosis not present

## 2016-01-15 DIAGNOSIS — I10 Essential (primary) hypertension: Secondary | ICD-10-CM

## 2016-01-15 DIAGNOSIS — E559 Vitamin D deficiency, unspecified: Secondary | ICD-10-CM | POA: Diagnosis not present

## 2016-01-15 LAB — COMPREHENSIVE METABOLIC PANEL
ALBUMIN: 4.2 g/dL (ref 3.5–5.2)
ALK PHOS: 61 U/L (ref 39–117)
ALT: 20 U/L (ref 0–35)
AST: 19 U/L (ref 0–37)
BILIRUBIN TOTAL: 0.3 mg/dL (ref 0.2–1.2)
BUN: 18 mg/dL (ref 6–23)
CO2: 30 mEq/L (ref 19–32)
CREATININE: 1.04 mg/dL (ref 0.40–1.20)
Calcium: 9.5 mg/dL (ref 8.4–10.5)
Chloride: 106 mEq/L (ref 96–112)
GFR: 56.64 mL/min — ABNORMAL LOW (ref >60.00)
GLUCOSE: 112 mg/dL — AB (ref 70–99)
POTASSIUM: 3.9 meq/L (ref 3.5–5.1)
SODIUM: 143 meq/L (ref 135–145)
TOTAL PROTEIN: 6.3 g/dL (ref 6.0–8.3)

## 2016-01-15 LAB — LIPID PANEL
CHOL/HDL RATIO: 3
Cholesterol: 159 mg/dL (ref 0–200)
HDL: 52 mg/dL (ref >39.00)
LDL Cholesterol: 91 mg/dL (ref 0–99)
NONHDL: 107.23
Triglycerides: 79 mg/dL (ref 0.0–149.0)
VLDL: 15.8 mg/dL (ref 0.0–40.0)

## 2016-01-15 LAB — VITAMIN D 25 HYDROXY (VIT D DEFICIENCY, FRACTURES): VITD: 26.72 ng/mL — AB (ref 30.00–100.00)

## 2016-01-18 ENCOUNTER — Encounter: Payer: Self-pay | Admitting: Internal Medicine

## 2016-03-13 ENCOUNTER — Other Ambulatory Visit: Payer: Self-pay | Admitting: Internal Medicine

## 2016-04-05 ENCOUNTER — Encounter: Payer: Self-pay | Admitting: Internal Medicine

## 2016-04-07 ENCOUNTER — Encounter: Payer: Self-pay | Admitting: Internal Medicine

## 2016-04-11 ENCOUNTER — Encounter: Payer: Self-pay | Admitting: Internal Medicine

## 2016-04-11 ENCOUNTER — Ambulatory Visit (INDEPENDENT_AMBULATORY_CARE_PROVIDER_SITE_OTHER): Payer: BC Managed Care – PPO | Admitting: Internal Medicine

## 2016-04-11 ENCOUNTER — Other Ambulatory Visit (HOSPITAL_COMMUNITY)
Admission: RE | Admit: 2016-04-11 | Discharge: 2016-04-11 | Disposition: A | Discharge status: Home / Self Care | Payer: BC Managed Care – PPO | Source: Ambulatory Visit | Attending: Internal Medicine | Admitting: Internal Medicine

## 2016-04-11 VITALS — BP 132/80 | HR 74 | Temp 98.3°F | Resp 15 | Ht 62.0 in | Wt 215.0 lb

## 2016-04-11 DIAGNOSIS — Z0000 Encounter for general adult medical examination without abnormal findings: Secondary | ICD-10-CM | POA: Diagnosis not present

## 2016-04-11 DIAGNOSIS — Z6841 Body Mass Index (BMI) 40.0 and over, adult: Secondary | ICD-10-CM | POA: Diagnosis not present

## 2016-04-11 DIAGNOSIS — R350 Frequency of micturition: Secondary | ICD-10-CM | POA: Diagnosis not present

## 2016-04-11 DIAGNOSIS — Z124 Encounter for screening for malignant neoplasm of cervix: Secondary | ICD-10-CM

## 2016-04-11 DIAGNOSIS — N952 Postmenopausal atrophic vaginitis: Secondary | ICD-10-CM | POA: Diagnosis not present

## 2016-04-11 DIAGNOSIS — R7301 Impaired fasting glucose: Secondary | ICD-10-CM | POA: Diagnosis not present

## 2016-04-11 DIAGNOSIS — F324 Major depressive disorder, single episode, in partial remission: Secondary | ICD-10-CM

## 2016-04-11 DIAGNOSIS — I10 Essential (primary) hypertension: Secondary | ICD-10-CM

## 2016-04-11 DIAGNOSIS — E7800 Pure hypercholesterolemia, unspecified: Secondary | ICD-10-CM

## 2016-04-11 LAB — POCT GLYCOSYLATED HEMOGLOBIN (HGB A1C): Hemoglobin A1C: 5.5

## 2016-04-11 NOTE — Patient Instructions (Addendum)
Your  fasting glucose has never been  diagnostic of diabetes; but your A1c   YOU NEED 2000 iUS OF D3 DAILY THROUGH DIET AND SUPPLEMENT     To make a low carb chip :  Take the Joseph's Lavash or Pita bread,  Or the Mission Low carb whole wheat tortilla   Place on metal cookie sheet  Brush with olive oil  Sprinkle garlic powder (NOT garlic salt), grated parmesan cheese, mediterranean seasoning , or all of them?  Bake at 275 for 30 minutes   We have substitutions for your potatoes!!  Try the mashed cauliflower and riced cauliflower dishes instead of rice and mashed potatoes  Mashed turnips are also very low carb!   For desserts :  Try the Dannon Lt n Fit greek yogurt dessert flavors and top with reddi Whip .  8 carbs,  80 calories  Try Oikos Triple Zero [place] Yogurt in the salted caramel, and the coffee flavors  With Whipped Cream for dessert  breyer's low carb ice cream, available in bars (on a stick, better ) or scoopable ice cream  HERE ARE THE LOW CARB  BREAD CHOICES

## 2016-04-11 NOTE — Progress Notes (Signed)
Patient ID: Michele Garza, female    DOB: August 12, 1950  Age: 66 y.o. MRN: [id number]  The patient is here for annual PREVENTIVE  examination and management of other chronic and acute problems.   The risk factors are reflected in the social history.  The roster of all physicians providing medical care to patient - is listed in the Snapshot section of the chart.  Activities of daily living:  The patient is 100% independent in all ADLs: dressing, toileting, feeding as well as independent mobility  Home safety : The patient has smoke detectors in the home. They wear seatbelts.  There are no firearms at home. There is no violence in the home.   There is no risks for hepatitis, STDs or HIV. There is no   history of blood transfusion. They have no travel history to infectious disease endemic areas of the world.  The patient has seen their dentist in the last six month. They have seen their eye doctor in the last year. They admit to slight hearing difficulty with regard to whispered voices and some television programs.  They have deferred audiologic testing in the last year.  They do not  have excessive sun exposure. Discussed the need for sun protection: hats, long sleeves and use of sunscreen if there is significant sun exposure.   Diet: the importance of a healthy diet is discussed. They do have a healthy diet.  The benefits of regular aerobic exercise were discussed. She walks 4 times per week ,  20 minutes.   Depression screen: there are no signs or vegative symptoms of depression- irritability, change in appetite, anhedonia, sadness/tearfullness.  Cognitive assessment: the patient manages all their financial and personal affairs and is actively engaged. They could relate day,date,year and events; recalled 2/3 objects at 3 minutes; performed clock-face test normally.  The following portions of the patient's history were reviewed and updated as appropriate: allergies, current medications, past  family history, past medical history,  past surgical history, past social history  and problem list.  Visual acuity was not assessed per patient preference since she has regular follow up with her ophthalmologist. Hearing and body mass index were assessed and reviewed.   During the course of the visit the patient was educated and counseled about appropriate screening and preventive services including : fall prevention , diabetes screening, nutrition counseling, colorectal cancer screening, and recommended immunizations.    CC: The primary encounter diagnosis was Visit for preventive health examination. Diagnoses of Impaired fasting glucose, Urinary frequency, Screening for cervical cancer, Morbid obesity with BMI of 40.0-44.9, adult ([place]), Hypertension, unspecified type, Major depressive disorder with single episode, in partial remission ([place]), Pure hypercholesterolemia, and Postmenopausal atrophic vaginitis were also pertinent to this visit.   Several weeks ago she noted that her mood was declining, she was more irritable , less motivated,  More pessimistic and sad.  She has a history of depression and decided to resume her previous regimen of  WELLBUTRIN 300 MG daily and did so last week.  She is starting to feel better.  Treated for UTI by employee health recently ,  Labs reviewed, only hematuria was noted,  No micro or culture was done     She has been checking her blood pressure and readings have been 130/80. Has been  Having some mild headaches. No nausea, or vision changes   History Clotine has a past medical history of Depression; Diverticulosis of colon; GERD (gastroesophageal reflux disease); History of colonoscopy; and Hypertension.   She  has a past surgical history that includes Breast surgery.   Her family history includes Cancer in her father, maternal uncle, and mother; Cancer (age of onset: 30) in her paternal grandmother; Heart disease (age of onset: 66) in her father; Stroke in  her father.She reports that she has never smoked. She has never used smokeless tobacco. She reports that she drinks about 0.6 oz of alcohol per week . She reports that she does not use drugs.  Outpatient Medications Prior to Visit  Medication Sig Dispense Refill  . acyclovir (ZOVIRAX) 400 MG tablet Take 1 tablet (400 mg total) by mouth every 4 (four) hours while awake. 28 tablet 3  . aspirin 81 MG tablet Take 81 mg by mouth daily.      Marland Kitchen atorvastatin (LIPITOR) 20 MG tablet TAKE 1 TABLET BY MOUTH EVERY DAY 90 tablet 0  . buPROPion (WELLBUTRIN XL) 150 MG 24 hr tablet TAKE 1 TABLET BY MOUTH EVERY DAY 30 tablet 2  . calcium citrate-vitamin D (CITRACAL+D) 315-200 MG-UNIT per tablet Take 1 tablet by mouth daily.      . fish oil-omega-3 fatty acids 1000 MG capsule Take 2 g by mouth daily.    Marland Kitchen losartan (COZAAR) 50 MG tablet TAKE 1 TABLET (50 MG TOTAL) BY MOUTH DAILY. 90 tablet 1  . metoprolol succinate (TOPROL-XL) 25 MG 24 hr tablet TAKE 1 TABLET BY MOUTH EVERY DAY 90 tablet 0  . Multiple Vitamin (MULTIVITAMIN) tablet Take 1 tablet by mouth daily.      Marland Kitchen PREMARIN vaginal cream 1 TO 2 GRAMS INTRAVAGINALLY TWICE WEEKLY 30 g 2  . CONTRAVE 8-90 MG TB12 TAKE 1TAB IN THE MORNING X 1WEEK. THEN TWICE A DAY X 1WEEK. INCREASE GRADUALLY TO 2TABS TWICE DAILY (Patient not taking: Reported on 04/11/2016) 120 tablet 0  . furosemide (LASIX) 20 MG tablet TAKE 1 TABLET (20 MG TOTAL) BY MOUTH DAILY. (Patient not taking: Reported on 07/10/2015) 30 tablet 5  . phentermine (ADIPEX-P) 37.5 MG tablet 1/2 tablet in the am and early afternoon (Patient not taking: Reported on 04/11/2016) 30 tablet 0   No facility-administered medications prior to visit.     Review of Systems   Patient denies  fevers, malaise, unintentional weight loss, skin rash, eye pain, sinus congestion and sinus pain, sore throat, dysphagia,  hemoptysis , cough, dyspnea, wheezing, chest pain, palpitations, orthopnea, edema, abdominal pain, nausea, melena,  diarrhea, constipation, flank pain, dysuria, hematuria, urinary  Frequency, nocturia, numbness, tingling, seizures,  Focal weakness, Loss of consciousness,  Tremor, insomnia,, anxiety, and suicidal ideation.      Objective:  BP 132/80 (BP Location: Left Arm, Patient Position: Sitting, Cuff Size: Large)   Pulse 74   Temp 98.3 F (36.8 C) (Oral)   Resp 15   Ht 5\' 2"  (1.575 m)   Wt 215 lb (97.5 kg)   SpO2 98%   BMI 39.32 kg/m   Physical Exam   General Appearance:    Alert, cooperative, no distress, appears stated age  Head:    Normocephalic, without obvious abnormality, atraumatic  Eyes:    PERRL, conjunctiva/corneas clear, EOM's intact, fundi    benign, both eyes  Ears:    Normal TM's and external ear canals, both ears  Nose:   Nares normal, septum midline, mucosa normal, no drainage    or sinus tenderness  Throat:   Lips, mucosa, and tongue normal; teeth and gums normal  Neck:   Supple, symmetrical, trachea midline, no adenopathy;    thyroid:  no enlargement/tenderness/nodules;  no carotid   bruit or JVD  Back:     Symmetric, no curvature, ROM normal, no CVA tenderness  Lungs:     Clear to auscultation bilaterally, respirations unlabored  Chest Wall:    No tenderness or deformity   Heart:    Regular rate and rhythm, S1 and S2 normal, no murmur, rub   or gallop  Breast Exam:    No tenderness, masses, or nipple abnormality  Abdomen:     Soft, non-tender, bowel sounds active all four quadrants,    no masses, no organomegaly  Genitalia:    Pelvic: cervix normal in appearance, external genitalia normal, no adnexal masses or tenderness, no cervical motion tenderness, rectovaginal septum normal, uterus normal size, shape, and consistency and vagina normal without discharge  Extremities:   Extremities normal, atraumatic, no cyanosis or edema  Pulses:   2+ and symmetric all extremities  Skin:   Skin color, texture, turgor normal, no rashes or lesions  Lymph nodes:   Cervical,  supraclavicular, and axillary nodes normal  Neurologic:   CNII-XII intact, normal strength, sensation and reflexes    throughout      Assessment & Plan:   Problem List Items Addressed This Visit    Hyperlipidemia    She was started on atorvastatin for  Mitigation of CAD risk based on ACC guidelines .  Lfts and LDL were   Normal and at goal in December,  No changes today   Lab Results  Component Value Date   CHOL 159 01/15/2016   HDL 52.00 01/15/2016   LDLCALC 91 01/15/2016   LDLDIRECT 171.2 02/28/2013   TRIG 79.0 01/15/2016   CHOLHDL 3 01/15/2016   Lab Results  Component Value Date   ALT 20 01/15/2016   AST 19 01/15/2016   ALKPHOS 61 01/15/2016   BILITOT 0.3 01/15/2016            Hypertension     The new goals for optimal blood pressure management at 120/70 were discussed,  Treatment  Change postponed for 3 months given her plans to start a daily exercise program.continue losartan and metoprolol   No results found for: Derl Barrow Lab Results  Component Value Date   CREATININE 1.04 01/15/2016         Major depressive disorder in partial remission ([place])    She has resumed wellbutrin at 300 mg dose       Morbid obesity with BMI of 40.0-44.9, adult ([place])    wellbutrin resumed,  Since contrave was not covered by insurance.   Low carb diet and regular exercise recommended .diabetes screen negative   Lab Results  Component Value Date   CHOL 159 01/15/2016   HDL 52.00 01/15/2016   LDLCALC 91 01/15/2016   LDLDIRECT 171.2 02/28/2013   TRIG 79.0 01/15/2016   CHOLHDL 3 01/15/2016   Lab Results  Component Value Date   HGBA1C 5.5 04/11/2016         Postmenopausal atrophic vaginitis    With recent empiric treatment for UTI by employee health with only heamturia noted on dipstick ua,  Checking micro and culture  Tomorrow       Visit for preventive health examination - Primary    Annual comprehensive preventive exam was done as well as an  evaluation and management of chronic conditions .  During the course of the visit the patient was educated and counseled about appropriate screening and preventive services including :  diabetes screening, lipid analysis with projected  10 year  risk for CAD , nutrition counseling, breast, cervical and colorectal cancer screening, and recommended immunizations.  Printed recommendations for health maintenance screenings was given.  PAP smear was done        Other Visit Diagnoses    Impaired fasting glucose       Relevant Orders   POCT A1C (Completed)   Urinary frequency       Relevant Orders   Urine Microscopic Only   POCT urinalysis dipstick   Urine culture   Screening for cervical cancer       Relevant Orders   Cytology - PAP      I have discontinued Ms. Groseclose furosemide, phentermine, and CONTRAVE. I am also having her maintain her multivitamin, aspirin, calcium citrate-vitamin D, fish oil-omega-3 fatty acids, acyclovir, losartan, PREMARIN, buPROPion, atorvastatin, and metoprolol succinate.  No orders of the defined types were placed in this encounter.   Medications Discontinued During This Encounter  Medication Reason  . furosemide (LASIX) 20 MG tablet Patient has not taken in last 30 days  . phentermine (ADIPEX-P) 37.5 MG tablet Patient has not taken in last 30 days  . CONTRAVE 8-90 MG TB12 Patient has not taken in last 30 days    Follow-up: Return in about 3 months (around 07/12/2016).   Crecencio Mc, MD

## 2016-04-11 NOTE — Progress Notes (Signed)
Pre visit review using our clinic review tool, if applicable. No additional management support is needed unless otherwise documented below in the visit note. 

## 2016-04-12 ENCOUNTER — Encounter: Payer: Self-pay | Admitting: Internal Medicine

## 2016-04-12 NOTE — Assessment & Plan Note (Addendum)
With recent empiric treatment for UTI by employee health with only heamturia noted on dipstick ua,  Checking micro and culture  Tomorrow

## 2016-04-12 NOTE — Assessment & Plan Note (Signed)
She has resumed wellbutrin at 300 mg dose

## 2016-04-12 NOTE — Assessment & Plan Note (Addendum)
wellbutrin resumed,  Since contrave was not covered by insurance.   Low carb diet and regular exercise recommended .diabetes screen negative   Lab Results  Component Value Date   CHOL 159 01/15/2016   HDL 52.00 01/15/2016   LDLCALC 91 01/15/2016   LDLDIRECT 171.2 02/28/2013   TRIG 79.0 01/15/2016   CHOLHDL 3 01/15/2016   Lab Results  Component Value Date   HGBA1C 5.5 04/11/2016

## 2016-04-12 NOTE — Assessment & Plan Note (Signed)
Annual comprehensive preventive exam was done as well as an evaluation and management of chronic conditions .  During the course of the visit the patient was educated and counseled about appropriate screening and preventive services including :  diabetes screening, lipid analysis with projected  10 year  risk for CAD , nutrition counseling, breast, cervical and colorectal cancer screening, and recommended immunizations.  Printed recommendations for health maintenance screenings was given.  PAP smear was done

## 2016-04-12 NOTE — Assessment & Plan Note (Signed)
She was started on atorvastatin for  Mitigation of CAD risk based on ACC guidelines .  Lfts and LDL were   Normal and at goal in December,  No changes today   Lab Results  Component Value Date   CHOL 159 01/15/2016   HDL 52.00 01/15/2016   LDLCALC 91 01/15/2016   LDLDIRECT 171.2 02/28/2013   TRIG 79.0 01/15/2016   CHOLHDL 3 01/15/2016   Lab Results  Component Value Date   ALT 20 01/15/2016   AST 19 01/15/2016   ALKPHOS 61 01/15/2016   BILITOT 0.3 01/15/2016

## 2016-04-12 NOTE — Assessment & Plan Note (Addendum)
  The new goals for optimal blood pressure management at 120/70 were discussed,  Treatment  Change postponed for 3 months given her plans to start a daily exercise program.continue losartan and metoprolol   No results found for: Derl Barrow Lab Results  Component Value Date   CREATININE 1.04 01/15/2016

## 2016-04-13 LAB — CYTOLOGY - PAP
DIAGNOSIS: NEGATIVE
HPV: NOT DETECTED

## 2016-04-14 ENCOUNTER — Encounter: Payer: Self-pay | Admitting: Internal Medicine

## 2016-04-24 ENCOUNTER — Other Ambulatory Visit: Payer: Self-pay | Admitting: Internal Medicine

## 2016-05-13 LAB — HM MAMMOGRAPHY

## 2016-05-23 ENCOUNTER — Other Ambulatory Visit: Payer: Self-pay | Admitting: Internal Medicine

## 2016-05-29 ENCOUNTER — Other Ambulatory Visit: Payer: Self-pay | Admitting: Internal Medicine

## 2016-06-18 ENCOUNTER — Other Ambulatory Visit: Payer: Self-pay | Admitting: Internal Medicine

## 2016-07-10 ENCOUNTER — Other Ambulatory Visit: Payer: Self-pay | Admitting: Internal Medicine

## 2016-07-15 ENCOUNTER — Ambulatory Visit: Payer: BC Managed Care – PPO | Admitting: Internal Medicine

## 2016-07-25 ENCOUNTER — Other Ambulatory Visit: Payer: Self-pay

## 2016-07-25 MED ORDER — BUPROPION HCL ER (XL) 300 MG PO TB24: ORAL_TABLET | ORAL | 1 refills | Status: DC

## 2016-07-28 ENCOUNTER — Ambulatory Visit: Payer: BC Managed Care – PPO | Admitting: Internal Medicine

## 2016-08-01 ENCOUNTER — Other Ambulatory Visit: Payer: Self-pay | Admitting: Internal Medicine

## 2016-08-01 DIAGNOSIS — Z8619 Personal history of other infectious and parasitic diseases: Secondary | ICD-10-CM

## 2016-09-02 ENCOUNTER — Ambulatory Visit: Payer: BC Managed Care – PPO | Admitting: Internal Medicine

## 2016-09-17 ENCOUNTER — Other Ambulatory Visit: Payer: Self-pay | Admitting: Internal Medicine

## 2016-09-20 ENCOUNTER — Other Ambulatory Visit: Payer: Self-pay

## 2016-09-22 ENCOUNTER — Other Ambulatory Visit: Payer: Self-pay | Admitting: Internal Medicine

## 2016-10-08 ENCOUNTER — Other Ambulatory Visit: Payer: Self-pay | Admitting: Internal Medicine

## 2016-10-17 ENCOUNTER — Ambulatory Visit: Payer: BC Managed Care – PPO | Admitting: Internal Medicine

## 2016-11-11 ENCOUNTER — Encounter: Payer: Self-pay | Admitting: Internal Medicine

## 2016-11-11 ENCOUNTER — Ambulatory Visit (INDEPENDENT_AMBULATORY_CARE_PROVIDER_SITE_OTHER): Payer: Medicare Other | Admitting: Internal Medicine

## 2016-11-11 DIAGNOSIS — E7800 Pure hypercholesterolemia, unspecified: Secondary | ICD-10-CM

## 2016-11-11 DIAGNOSIS — I10 Essential (primary) hypertension: Secondary | ICD-10-CM

## 2016-11-11 DIAGNOSIS — K5900 Constipation, unspecified: Secondary | ICD-10-CM

## 2016-11-11 DIAGNOSIS — R7301 Impaired fasting glucose: Secondary | ICD-10-CM

## 2016-11-11 DIAGNOSIS — Z6841 Body Mass Index (BMI) 40.0 and over, adult: Secondary | ICD-10-CM | POA: Diagnosis not present

## 2016-11-11 LAB — COMPREHENSIVE METABOLIC PANEL
ALT: 17 U/L (ref 0–35)
AST: 19 U/L (ref 0–37)
Albumin: 4.3 g/dL (ref 3.5–5.2)
Alkaline Phosphatase: 49 U/L (ref 39–117)
BILIRUBIN TOTAL: 0.6 mg/dL (ref 0.2–1.2)
BUN: 24 mg/dL — ABNORMAL HIGH (ref 6–23)
CO2: 29 meq/L (ref 19–32)
Calcium: 10 mg/dL (ref 8.4–10.5)
Chloride: 101 mEq/L (ref 96–112)
Creatinine, Ser: 1.02 mg/dL (ref 0.40–1.20)
GFR: 57.78 mL/min — AB (ref >60.00)
GLUCOSE: 105 mg/dL — AB (ref 70–99)
Potassium: 4.3 mEq/L (ref 3.5–5.1)
SODIUM: 138 meq/L (ref 135–145)
Total Protein: 7.3 g/dL (ref 6.0–8.3)

## 2016-11-11 LAB — LIPID PANEL
CHOL/HDL RATIO: 3
Cholesterol: 173 mg/dL (ref 0–200)
HDL: 50.6 mg/dL (ref >39.00)
LDL CALC: 109 mg/dL — AB (ref 0–99)
NONHDL: 122.27
TRIGLYCERIDES: 66 mg/dL (ref 0.0–149.0)
VLDL: 13.2 mg/dL (ref 0.0–40.0)

## 2016-11-11 LAB — MICROALBUMIN / CREATININE URINE RATIO
CREATININE, U: 136.7 mg/dL
Microalb Creat Ratio: 0.5 mg/g (ref 0.0–30.0)
Microalb, Ur: <0.7 mg/dL (ref 0.0–1.9)

## 2016-11-11 LAB — LDL CHOLESTEROL, DIRECT: Direct LDL: 108 mg/dL

## 2016-11-11 LAB — HEMOGLOBIN A1C: Hgb A1c MFr Bld: 5.6 % (ref 4.6–6.5)

## 2016-11-11 NOTE — Patient Instructions (Addendum)
Please make your "Welcome to Medicare" appt (your physical) at the end of March   For your constipation:   You can use the flax capsule plus a stool softener (Colace /docusate up to 300 mg daily )  And/or the bulk forming laxatives    I want you to lose 21 lbs (10% ) over the next 6 months   This is  My  version of a  "Low GI"  Diet:  It is similar to the KETO diet but has less fat.  It will still lower your blood sugars and allow you to lose 4 to 8  lbs  per month if you follow it carefully and combine it with 30 minutes of aerobic exercise 5 days per week .   All of the foods can be found at grocery stores and in bulk at [employer].  The Atkins protein bars and shakes are available in more varieties at [employer], [employer] and [place].     7 AM Breakfast:  Choose from the following:  Low carbohydrate Protein  Shakes (I recommend the Premier Protein, Muscle Milk,  EAS AdvantEdge "Carb Control" shakes  Or Atkins.    1-3 carbs   Frittata (home made or Jimmy Dean's " Delight" fritatta muffins ) about 9 carbs     a scrambled egg/bacon/cheese burrito made with Mission's "carb balance" w hole wheat tortilla  (about 10 net carbs )   Avoid cereal and bananas, oatmeal and cream of wheat and grits. They are loaded with carbohydrates!   10 AM: high protein snack  Mini KIND bar   3 g sugar 100 cal  Snack size protein bar by Atkins (the snack size, under 200 cal, usually < 6 net  carbs).    A stick of cheese:  Around 1 carb,  100 cal     Dannon Light n Fit [place] Yogurt  (80 cal, 8 carbs)  Other so called "protein bars" and Greek yogurts tend to be loaded with carbohydrates.  Remember, in food advertising, the word "energy" is synonymous for " carbohydrate."  Lunch:   A Sandwich using the bread choices listed, Can use any  Eggs,  lunchmeat, grilled meat or canned tuna), avocado, regular mayo/mustard  and cheese.  A Salad using blue cheese, ranch,  Goddess or vinagrette dressings.   No croutons or  "confetti" and no "candied nuts" but regular nuts OK.   No pretzels or chips.  Pickles and miniature sweet peppers are  good low carb alternatives  The bread is the only source or carbohydrate that can be decreased (Joseph's makes a pita bread and a flat bread that are 50 cal and 4 net carbs ; Toufayan makes a low carb flatbread that's 100 cal and 9 net carbs  and  Mission's carb balance whole wheat tortilla  That is 210 cal and 6 net carbs)  Avoid "Low fat dressings, as well as Barry Brunner and [employer] dressings They are loaded with sugar!     3 PM/ Mid day  Snack:  Consider  1 ounce of  almonds, walnuts, pistachios, pecans, peanuts,  Macadamia nuts or a nut medley.  Low carb yogurt with whipped cream. (Dannon Lt n Fit,  Oikos Triple Zero)   Avoid "granola"; the dried cranberries and raisins are loaded with carbohydrates. Mixed nuts ok if no raisins or cranberries or dried fruit.      6 PM  Dinner:    "mean and green, "  Any grilled or baked Meat/chicken/fish  with a green salad, and broccoli, cauliflower, green beans, spinach, brussel sprouts or  Lima beans::       There is a low carb pasta by Dreamfield's available at [employer] that is acceptable and tastes great only 5 digestible carbs/serving.   Prepared  Meals:  Rotisserie chicken,   Michel Angelo's chicken piccata or chicken or eggplant parmigiana  over low carb pasta.   Marjory Lies Sanchez's "Carnitas" (pulled pork, no sauce,  0 carbs) or his beef pot roast to make a dinner burrito  Whole wheat pasta is still full of digestible carbs and  Not as low in glycemic index as Dreamfield's.   Brown rice is still rice,  So skip the rice and noodles if you eat [place] or [place]  9 PM snack :   Breyer's "low carb" fudgsicle or  ice cream bar (Carb Smart line), or  Weight Watcher's ice cream bar , or another "no sugar added" ice cream;  a serving of fresh berries/cherries with whipped cream   Cheese or yogurt  Avoid bananas, pineapple,  grapes  and watermelon on a regular basis because they are high in sugar)   Remember that snack Substitutions should be less than 5 carbs per serving and meals < 20 carbs. Remember to subtract fiber grams to get the "net carbs."

## 2016-11-11 NOTE — Progress Notes (Addendum)
Subjective:  Patient ID: Michele Garza, female    DOB: Oct 15, 1950  Age: 66 y.o. MRN: [id number]  CC: Diagnoses of Hypertension, unspecified type, Impaired fasting glucose, Pure hypercholesterolemia, Morbid obesity with BMI of 40.0-44.9, adult ([place]), and Constipation, unspecified constipation type were pertinent to this visit.  HPI Michele Garza presents for follow up on  morbid obesity , , hypertension hyperlipidemia   She is recovering from a brief illness that presented with fever and body aches/chills that occurred  2 days ago,  Lasted one day,  Occurred after eating some "old meat" at home. Did not have any diarrhea, but had soft stools whereas she is usually chronically constipated.    Anxiety;  Aggravated by emotional stress of best friend's untimely and unexplained death after developing sepsis from untreated cellulitis .  Depression:   Taking wellbutrin 300 mg daily.   Her friend's death has improved her motivation to improve her own health . She admits that she has not started a regular exercise program yet but recognizes that she needs to and has joined a gym.      Outpatient Medications Prior to Visit  Medication Sig Dispense Refill  . acyclovir (ZOVIRAX) 400 MG tablet TAKE 1 TABLET (400 MG TOTAL) BY MOUTH EVERY 4 (FOUR) HOURS WHILE AWAKE. 28 tablet 0  . aspirin 81 MG tablet Take 81 mg by mouth daily.      Marland Kitchen atorvastatin (LIPITOR) 20 MG tablet TAKE 1 TABLET BY MOUTH EVERY DAY 90 tablet 0  . buPROPion (WELLBUTRIN XL) 150 MG 24 hr tablet TAKE 1 TABLET BY MOUTH EVERY DAY 30 tablet 1  . calcium citrate-vitamin D (CITRACAL+D) 315-200 MG-UNIT per tablet Take 1 tablet by mouth daily.      . fish oil-omega-3 fatty acids 1000 MG capsule Take 2 g by mouth daily.    Marland Kitchen losartan (COZAAR) 50 MG tablet TAKE 1 TABLET (50 MG TOTAL) BY MOUTH DAILY. 90 tablet 1  . metoprolol succinate (TOPROL-XL) 25 MG 24 hr tablet TAKE 1 TABLET BY MOUTH EVERY DAY 90 tablet 0  . Multiple Vitamin  (MULTIVITAMIN) tablet Take 1 tablet by mouth daily.      Marland Kitchen PREMARIN vaginal cream 1 TO 2 GRAMS INTRAVAGINALLY TWICE WEEKLY 30 g 2  . atorvastatin (LIPITOR) 20 MG tablet TAKE 1 TABLET BY MOUTH EVERY DAY (Patient not taking: Reported on 11/11/2016) 90 tablet 0  . buPROPion (WELLBUTRIN XL) 300 MG 24 hr tablet TAKE 1 TABLET BY MOUTH EVERY DAY (Patient not taking: Reported on 11/11/2016) 30 tablet 1   No facility-administered medications prior to visit.     Review of Systems;  Patient denies headache, fevers, malaise, unintentional weight loss, skin rash, eye pain, sinus congestion and sinus pain, sore throat, dysphagia,  hemoptysis , cough, dyspnea, wheezing, chest pain, palpitations, orthopnea, edema, abdominal pain, nausea, melena, diarrhea, constipation, flank pain, dysuria, hematuria, urinary  Frequency, nocturia, numbness, tingling, seizures,  Focal weakness, Loss of consciousness,  Tremor, insomnia, depression, anxiety, and suicidal ideation.      Objective:  BP 120/76 (BP Location: Left Arm, Patient Position: Sitting, Cuff Size: Large)   Pulse 91   Temp 98.4 F (36.9 C) (Oral)   Resp 16   Ht 5\' 2"  (1.575 m)   Wt 214 lb 12.8 oz (97.4 kg)   SpO2 96%   BMI 39.29 kg/m   BP Readings from Last 3 Encounters:  11/11/16 120/76  04/11/16 132/80  07/10/15 128/82    Wt Readings from Last 3 Encounters:  11/11/16  214 lb 12.8 oz (97.4 kg)  04/11/16 215 lb (97.5 kg)  07/10/15 202 lb (91.6 kg)    General appearance: alert, cooperative and appears stated age Ears: normal TM's and external ear canals both ears Throat: lips, mucosa, and tongue normal; teeth and gums normal Neck: no adenopathy, no carotid bruit, supple, symmetrical, trachea midline and thyroid not enlarged, symmetric, no tenderness/mass/nodules Back: symmetric, no curvature. ROM normal. No CVA tenderness. Lungs: clear to auscultation bilaterally Heart: regular rate and rhythm, S1, S2 normal, no murmur, click, rub or  gallop Abdomen: soft, non-tender; bowel sounds normal; no masses,  no organomegaly Pulses: 2+ and symmetric Skin: Skin color, texture, turgor normal. No rashes or lesions Lymph nodes: Cervical, supraclavicular, and axillary nodes normal.  Lab Results  Component Value Date   HGBA1C 5.6 11/11/2016   HGBA1C 5.5 04/11/2016   HGBA1C 5.7 03/31/2014    Lab Results  Component Value Date   CREATININE 1.02 11/11/2016   CREATININE 1.04 01/15/2016   CREATININE 0.96 04/03/2015    Lab Results  Component Value Date   WBC 7.9 04/03/2015   HGB 14.1 04/03/2015   HCT 41.1 04/03/2015   PLT 217 04/03/2015   GLUCOSE 105 (H) 11/11/2016   CHOL 173 11/11/2016   TRIG 66.0 11/11/2016   HDL 50.60 11/11/2016   LDLDIRECT 108.0 11/11/2016   LDLCALC 109 (H) 11/11/2016   ALT 17 11/11/2016   AST 19 11/11/2016   NA 138 11/11/2016   K 4.3 11/11/2016   CL 101 11/11/2016   CREATININE 1.02 11/11/2016   BUN 24 (H) 11/11/2016   CO2 29 11/11/2016   TSH 0.66 04/03/2015   HGBA1C 5.6 11/11/2016   MICROALBUR <0.7 11/11/2016    No results found.  Assessment & Plan:   Problem List Items Addressed This Visit    Constipation    Regular use of stool softeners and bulk forming laxatives advised      Hyperlipidemia    LDL and triglycerides are at goal on current medications. He has no side effects and liver enzymes are normal. No changes today   Lab Results  Component Value Date   CHOL 173 11/11/2016   HDL 50.60 11/11/2016   LDLCALC 109 (H) 11/11/2016   LDLDIRECT 108.0 11/11/2016   TRIG 66.0 11/11/2016   CHOLHDL 3 11/11/2016   Lab Results  Component Value Date   ALT 17 11/11/2016   AST 19 11/11/2016   ALKPHOS 49 11/11/2016   BILITOT 0.6 11/11/2016         Hypertension    Well controlled on current regimen. Renal function stable, no changes today.      Impaired fasting glucose    A1c is normal  Lab Results  Component Value Date   HGBA1C 5.6 11/11/2016         Morbid obesity with  BMI of 40.0-44.9, adult ([place])    I have addressed  BMI and recommended wt loss of 10% of body weigh over the next 6 months using a low glycemic index diet and regular exercise a minimum of 5 days per week. A total of 25 minutes of face to face time was spent with patient more than half of which was spent in counselling about her weight , discussing the low glycemic index dietary concept and the value of regular particpation in exercise.            I am having Ms. Plasse maintain her multivitamin, aspirin, calcium citrate-vitamin D, fish oil-omega-3 fatty acids, PREMARIN, buPROPion, losartan, acyclovir,  atorvastatin, and metoprolol succinate.  No orders of the defined types were placed in this encounter.   Medications Discontinued During This Encounter  Medication Reason  . atorvastatin (LIPITOR) 20 MG tablet Patient has not taken in last 30 days  . buPROPion (WELLBUTRIN XL) 300 MG 24 hr tablet Patient has not taken in last 30 days    Follow-up: Return in about 6 months (around 05/12/2017), or March : welcome to medicare  CPE.   Crecencio Mc, MD

## 2016-11-13 DIAGNOSIS — R7301 Impaired fasting glucose: Secondary | ICD-10-CM | POA: Insufficient documentation

## 2016-11-13 NOTE — Assessment & Plan Note (Signed)
I have addressed  BMI and recommended wt loss of 10% of body weigh over the next 6 months using a low glycemic index diet and regular exercise a minimum of 5 days per week. A total of 25 minutes of face to face time was spent with patient more than half of which was spent in counselling about her weight , discussing the low glycemic index dietary concept and the value of regular particpation in exercise.

## 2016-11-13 NOTE — Assessment & Plan Note (Signed)
Well controlled on current regimen. Renal function stable, no changes today. 

## 2016-11-13 NOTE — Assessment & Plan Note (Signed)
A1c is normal  Lab Results  Component Value Date   HGBA1C 5.6 11/11/2016

## 2016-11-13 NOTE — Assessment & Plan Note (Signed)
Regular use of stool softeners and bulk forming laxatives advised

## 2016-11-13 NOTE — Assessment & Plan Note (Signed)
LDL and triglycerides are at goal on current medications. He has no side effects and liver enzymes are normal. No changes today   Lab Results  Component Value Date   CHOL 173 11/11/2016   HDL 50.60 11/11/2016   LDLCALC 109 (H) 11/11/2016   LDLDIRECT 108.0 11/11/2016   TRIG 66.0 11/11/2016   CHOLHDL 3 11/11/2016   Lab Results  Component Value Date   ALT 17 11/11/2016   AST 19 11/11/2016   ALKPHOS 49 11/11/2016   BILITOT 0.6 11/11/2016

## 2016-11-25 ENCOUNTER — Telehealth: Payer: Self-pay | Admitting: Internal Medicine

## 2016-11-25 ENCOUNTER — Ambulatory Visit: Payer: Self-pay | Admitting: Hematology

## 2016-11-25 NOTE — Telephone Encounter (Deleted)
Copied from [place] [id number]. Topic: Inquiry >> Nov 25, 2016  9:18 AM [name], NT wrote: CRM for notification. See Telephone encounter for: 11/25/16. Have pain in ear with draining, says she was in a lot pain last night , took over the counter pain medicine, Says she knows it is not her teeth ,has jaw pain

## 2016-11-25 NOTE — Telephone Encounter (Signed)
Copied from [place] [id number]. Topic: Inquiry >> Nov 25, 2016  9:18 AM [name], NT wrote: CRM for notification. See Telephone encounter for: 11/25/16. Have pain in ear with draining, says she was in a lot pain last night , took over the counter pain medicine, Says she knows it is not her teeth ,has jaw pain

## 2016-11-25 NOTE — Telephone Encounter (Signed)
See triage encounter.

## 2016-11-25 NOTE — Telephone Encounter (Signed)
Patient states she had a cold last week and has recovered from that but still has ear pain an drainage.  I explained that symptoms of a cold can linger a while. Patient will follow home care advise and will call back if she develops drainage that has a color or smell to it.   Reason for Disposition . [1] Clear ear discharge AND [2] present < 24 hours  Protocols used: EAR - DISCHARGE-A-AH

## 2016-11-28 ENCOUNTER — Other Ambulatory Visit: Payer: Self-pay | Admitting: Internal Medicine

## 2016-11-28 ENCOUNTER — Encounter: Payer: Self-pay | Admitting: Internal Medicine

## 2016-11-29 ENCOUNTER — Other Ambulatory Visit: Payer: Self-pay

## 2016-11-29 ENCOUNTER — Ambulatory Visit
Admission: EM | Admit: 2016-11-29 | Discharge: 2016-11-29 | Disposition: A | Discharge status: Home / Self Care | Payer: Medicare Other | Attending: Family Medicine | Admitting: Family Medicine

## 2016-11-29 ENCOUNTER — Encounter: Payer: Self-pay | Admitting: Internal Medicine

## 2016-11-29 ENCOUNTER — Encounter: Payer: Self-pay | Admitting: Emergency Medicine

## 2016-11-29 DIAGNOSIS — J0101 Acute recurrent maxillary sinusitis: Secondary | ICD-10-CM | POA: Diagnosis not present

## 2016-11-29 MED ORDER — FLUTICASONE PROPIONATE 50 MCG/ACT NA SUSP: 2.0000 | Freq: Every day | NASAL | 0 refills | Status: DC

## 2016-11-29 MED ORDER — AMOXICILLIN-POT CLAVULANATE 875-125 MG PO TABS: 1.0000 | ORAL_TABLET | Freq: Two times a day (BID) | ORAL | 0 refills | Status: DC

## 2016-11-29 NOTE — ED Triage Notes (Signed)
Patient c/o sinus congestion and pressure and ear pain that started on Friday.

## 2016-11-29 NOTE — ED Provider Notes (Signed)
[facility]    CSN: [id number] Arrival date & time: 11/29/16  0859     History   Chief Complaint Chief Complaint  Patient presents with  . Sinus Problem    HPI Michele Garza is a 66 y.o. female.   HPI  This a 66 year old female who presents with sinus congestion and pressure that started as a cold about 2 weeks ago and has now progressed to left-sided facial pain daily over the maxillary sinus area and left ear pain that had a stabbing intermittent quality to it. She has no fever or chills. She has had previous history of sinus infections. At first was treated by Dr. Derrel Nip amoxicillin but that did not help and had to switch to a Z-Pak. She denies any coughing or shortness of breath.         Past Medical History:  Diagnosis Date  . Depression   . Diverticulosis of colon    one prior episode July 2011 caused by tomato overingestion  . GERD (gastroesophageal reflux disease)   . History of colonoscopy    done in  dec 2011, repeat due 5 yrs due to [place]  . Hypertension     Patient Active Problem List   Diagnosis Date Noted  . Impaired fasting glucose 11/13/2016  . Constipation 07/14/2014  . Hyperlipidemia 03/03/2013  . Visit for preventive health examination 03/01/2013  . Postmenopausal atrophic vaginitis 03/01/2013  . Anxiety state, unspecified 06/11/2012  . Plantar fasciitis, bilateral 06/11/2012  . Sinus tachycardia by electrocardiogram 06/08/2012  . History of herpes zoster 01/31/2012  . Morbid obesity with BMI of 40.0-44.9, adult ([place]) 01/31/2012  . Hypertension   . History of colonoscopy   . Diverticulosis of colon   . GERD (gastroesophageal reflux disease)   . Major depressive disorder in partial remission [facility])     Past Surgical History:  Procedure Laterality Date  . BREAST SURGERY     sterotactic breaswt biopsy: atypical cells,  continued diagnostic mammograms    OB History    No data available       Home Medications    Prior to  Admission medications   Medication Sig Start Date End Date Taking? Authorizing Provider  acyclovir (ZOVIRAX) 400 MG tablet TAKE 1 TABLET (400 MG TOTAL) BY MOUTH EVERY 4 (FOUR) HOURS WHILE AWAKE. 08/02/16   Crecencio Mc, MD  amoxicillin-clavulanate (AUGMENTIN) 875-125 MG tablet Take 1 tablet every 12 (twelve) hours by mouth. 11/29/16   Lorin Picket, PA-C  aspirin 81 MG tablet Take 81 mg by mouth daily.      [provider]  atorvastatin (LIPITOR) 20 MG tablet TAKE 1 TABLET BY MOUTH EVERY DAY 09/20/16   Crecencio Mc, MD  buPROPion (WELLBUTRIN XL) 150 MG 24 hr tablet TAKE 1 TABLET BY MOUTH EVERY DAY 04/25/16   Crecencio Mc, MD  calcium citrate-vitamin D (CITRACAL+D) 315-200 MG-UNIT per tablet Take 1 tablet by mouth daily.      [provider]  fish oil-omega-3 fatty acids 1000 MG capsule Take 2 g by mouth daily.    [provider]  fluticasone (FLONASE) 50 MCG/ACT nasal spray Place 2 sprays daily into both nostrils. 11/29/16   Lorin Picket, PA-C  losartan (COZAAR) 50 MG tablet TAKE 1 TABLET (50 MG TOTAL) BY MOUTH DAILY. 05/30/16   Crecencio Mc, MD  metoprolol succinate (TOPROL-XL) 25 MG 24 hr tablet TAKE 1 TABLET BY MOUTH EVERY DAY 10/10/16   Crecencio Mc, MD  Multiple  Vitamin (MULTIVITAMIN) tablet Take 1 tablet by mouth daily.      [provider]  PREMARIN vaginal cream 1 TO 2 GRAMS INTRAVAGINALLY TWICE WEEKLY 12/24/15   Crecencio Mc, MD    Family History Family History  Problem Relation Age of Onset  . Cancer Mother        bilatera, contralateral recurred after 30 yrs  . Stroke Father   . Heart disease Father 46       died of stroke  at 58, tobacco user  . Cancer Father        colon Ca  . Cancer Paternal Grandmother 80       metastatic colon Ca  . Cancer Maternal Uncle        breast x 2 aunts    Social History Social History   Tobacco Use  . Smoking status: Never Smoker  . Smokeless tobacco: Never Used  Substance Use Topics   . Alcohol use: Yes    Alcohol/week: 0.6 oz    Types: 1 Shots of liquor per week    Comment: occasional  . Drug use: No     Allergies   Clindamycin/lincomycin   Review of Systems Review of Systems  Constitutional: Positive for activity change and chills. Negative for fatigue and fever.  HENT: Positive for congestion, ear pain, sinus pressure and sinus pain. Negative for ear discharge.   Respiratory: Negative for cough and shortness of breath.   All other systems reviewed and are negative.    Physical Exam Triage Vital Signs ED Triage Vitals  Enc Vitals Group     BP 11/29/16 0915 (!) 157/84     Pulse Rate 11/29/16 0915 100     Resp 11/29/16 0915 16     Temp 11/29/16 0915 98.7 F (37.1 C)     Temp Source 11/29/16 0915 Oral     SpO2 11/29/16 0915 97 %     Weight 11/29/16 0913 217 lb 6 oz (98.6 kg)     Height 11/29/16 0913 5\' 2"  (1.575 m)     Head Circumference --      Peak Flow --      Pain Score 11/29/16 0913 5     Pain Loc --      Pain Edu? --      Excl. in [place]? --    No data found.  Updated Vital Signs BP (!) 157/84 (BP Location: Left Arm)   Pulse 100   Temp 98.7 F (37.1 C) (Oral)   Resp 16   Ht 5\' 2"  (1.575 m)   Wt 217 lb 6 oz (98.6 kg)   SpO2 97%   BMI 39.76 kg/m   Visual Acuity Right Eye Distance:   Left Eye Distance:   Bilateral Distance:    Right Eye Near:   Left Eye Near:    Bilateral Near:     Physical Exam  Constitutional: She is oriented to person, place, and time. She appears well-developed and well-nourished. No distress.  HENT:  Head: Normocephalic.  Right Ear: External ear normal.  Left Ear: External ear normal.  Nose: Nose normal.  Mouth/Throat: Oropharynx is clear and moist. No oropharyngeal exudate.  The patient has tenderness percussion over the left maxillary sinus.  Eyes: Pupils are equal, round, and reactive to light. Right eye exhibits no discharge. Left eye exhibits no discharge.  Neck: Normal range of motion.    Pulmonary/Chest: Effort normal and breath sounds normal.  Musculoskeletal: Normal range of motion.  Lymphadenopathy:  She has no cervical adenopathy.  Neurological: She is alert and oriented to person, place, and time.  Skin: Skin is warm and dry. She is not diaphoretic.  Psychiatric: She has a normal mood and affect. Her behavior is normal. Judgment and thought content normal.  Nursing note and vitals reviewed.    UC Treatments / Results  Labs (all labs ordered are listed, but only abnormal results are displayed) Labs Reviewed - No data to display  EKG  EKG Interpretation None       Radiology No results found.  Procedures Procedures (including critical care time)  Medications Ordered in UC Medications - No data to display   Initial Impression / Assessment and Plan / UC Course  I have reviewed the triage vital signs and the nursing notes.  Pertinent labs & imaging results that were available during my care of the patient were reviewed by me and considered in my medical decision making (see chart for details).    Plan: 1. Test/x-ray results and diagnosis reviewed with patient 2. rx as per orders; risks, benefits, potential side effects reviewed with patient 3. Recommend supportive treatment with use of a Nettie pot and Flonase. We'll start her on Augmentin since she is had the problem for over 2 weeks and she does have signs and symptoms of a maxillary sinusitis. If she is not improving she should follow-up with her primary care physician. 4. F/u prn if symptoms worsen or don't improve   Final Clinical Impressions(s) / UC Diagnoses   Final diagnoses:  Acute recurrent maxillary sinusitis    ED Discharge Orders        Ordered    amoxicillin-clavulanate (AUGMENTIN) 875-125 MG tablet  Every 12 hours     11/29/16 0941    fluticasone (FLONASE) 50 MCG/ACT nasal spray  Daily     11/29/16 0941       Controlled Substance Prescriptions [place] Controlled Substance  Registry consulted? Not Applicable   Lorin Picket, PA-C 11/29/16 [id number]

## 2016-12-14 ENCOUNTER — Other Ambulatory Visit: Payer: Self-pay | Admitting: Internal Medicine

## 2016-12-15 ENCOUNTER — Telehealth: Payer: Self-pay

## 2016-12-15 ENCOUNTER — Other Ambulatory Visit: Payer: Self-pay

## 2016-12-15 MED ORDER — BUPROPION HCL ER (XL) 150 MG PO TB24: 150.0000 mg | ORAL_TABLET | Freq: Every day | ORAL | 2 refills | Status: DC

## 2016-12-15 NOTE — Telephone Encounter (Signed)
Copied from [place] [id number]. Topic: Quick Communication - Office Called Patient >> Dec 15, 2016  1:53 PM Synthia Innocent wrote: Reason for CRM: returning call

## 2016-12-15 NOTE — Telephone Encounter (Signed)
Spoke with pt to see if she was currently taking the wellbutrin 150mg  or 300mg  because we had received a refill request for the 300mg  but it was not in her chart. The pt stated that she is currently taking the 150mg . Medication was refilled.

## 2016-12-20 ENCOUNTER — Other Ambulatory Visit: Payer: Self-pay

## 2016-12-20 MED ORDER — LOSARTAN POTASSIUM 50 MG PO TABS: ORAL_TABLET | ORAL | 1 refills | Status: DC

## 2017-01-07 ENCOUNTER — Other Ambulatory Visit: Payer: Self-pay | Admitting: Internal Medicine

## 2017-01-24 ENCOUNTER — Encounter: Payer: Self-pay | Admitting: Emergency Medicine

## 2017-01-24 ENCOUNTER — Other Ambulatory Visit: Payer: Self-pay

## 2017-01-24 ENCOUNTER — Ambulatory Visit
Admission: EM | Admit: 2017-01-24 | Discharge: 2017-01-24 | Disposition: A | Discharge status: Home / Self Care | Payer: Medicare Other | Attending: Family Medicine | Admitting: Family Medicine

## 2017-01-24 DIAGNOSIS — J069 Acute upper respiratory infection, unspecified: Secondary | ICD-10-CM

## 2017-01-24 DIAGNOSIS — R05 Cough: Secondary | ICD-10-CM

## 2017-01-24 MED ORDER — FLUTICASONE PROPIONATE 50 MCG/ACT NA SUSP: 2.0000 | Freq: Every day | NASAL | 1 refills | Status: DC

## 2017-01-24 MED ORDER — BENZONATATE 200 MG PO CAPS: ORAL_CAPSULE | ORAL | 0 refills | Status: DC

## 2017-01-24 MED ORDER — HYDROCOD POLST-CPM POLST ER 10-8 MG/5ML PO SUER: 5.0000 mL | Freq: Two times a day (BID) | ORAL | 0 refills | Status: DC

## 2017-01-24 MED ORDER — AZITHROMYCIN 250 MG PO TABS: 250.0000 mg | ORAL_TABLET | Freq: Every day | ORAL | 0 refills | Status: DC

## 2017-01-24 NOTE — ED Triage Notes (Signed)
Patient in tonight c/o nasal congestion, pain and pressure and productive cough. Patient finished Augmentin on Thanksgiving day. Patient states she got well for a couple of weeks. Patient has gone to her dentist to make sure it wasn't something dental. Patient has tried OTC Robitussin DM, Alka Seltzer cough and cold. Patient denies fever.

## 2017-01-24 NOTE — ED Provider Notes (Signed)
[facility]    CSN: [id number] Arrival date & time: 01/24/17  1824     History   Chief Complaint Chief Complaint  Patient presents with  . Nasal Congestion    APPT    HPI Michele Garza is a 67 y.o. female.   HPI  67 year old female presents with nasal congestion pain and pressure in her left sinus and productive cough.  Been afebrile. His last couple nights has been very terrible with the coughing when she lies recumbent.  She was worried about the green sputum that she is producing.  Patient had sinusitis around Thanksgiving was prescribed Augmentin and states that she got better for a few weeks.  Now it has returned however she does have a coughing component along with the sinus pain and pressure and drainage.  That is on room air 98%.  During the daytime when she is up moving around is not as bad but at nighttime when she lies flat is worse.      Past Medical History:  Diagnosis Date  . Depression   . Diverticulosis of colon    one prior episode July 2011 caused by tomato overingestion  . GERD (gastroesophageal reflux disease)   . History of colonoscopy    done in  dec 2011, repeat due 5 yrs due to [place]  . Hypertension     Patient Active Problem List   Diagnosis Date Noted  . Impaired fasting glucose 11/13/2016  . Constipation 07/14/2014  . Hyperlipidemia 03/03/2013  . Visit for preventive health examination 03/01/2013  . Postmenopausal atrophic vaginitis 03/01/2013  . Anxiety state, unspecified 06/11/2012  . Plantar fasciitis, bilateral 06/11/2012  . Sinus tachycardia by electrocardiogram 06/08/2012  . History of herpes zoster 01/31/2012  . Morbid obesity with BMI of 40.0-44.9, adult ([place]) 01/31/2012  . Hypertension   . History of colonoscopy   . Diverticulosis of colon   . GERD (gastroesophageal reflux disease)   . Major depressive disorder in partial remission [facility])     Past Surgical History:  Procedure Laterality Date  . BREAST SURGERY     sterotactic breaswt biopsy: atypical cells,  continued diagnostic mammograms    OB History    No data available       Home Medications    Prior to Admission medications   Medication Sig Start Date End Date Taking? Authorizing Provider  aspirin 81 MG tablet Take 81 mg by mouth daily.     Yes [provider]  atorvastatin (LIPITOR) 20 MG tablet TAKE 1 TABLET BY MOUTH EVERY DAY 09/20/16  Yes Crecencio Mc, MD  buPROPion (WELLBUTRIN XL) 150 MG 24 hr tablet Take 1 tablet (150 mg total) by mouth daily. 12/15/16  Yes Crecencio Mc, MD  calcium citrate-vitamin D (CITRACAL+D) 315-200 MG-UNIT per tablet Take 1 tablet by mouth daily.     Yes [provider]  fish oil-omega-3 fatty acids 1000 MG capsule Take 2 g by mouth daily.   Yes [provider]  losartan (COZAAR) 50 MG tablet TAKE 1 TABLET (50 MG TOTAL) BY MOUTH DAILY. 12/20/16  Yes Crecencio Mc, MD  metoprolol succinate (TOPROL-XL) 25 MG 24 hr tablet TAKE 1 TABLET BY MOUTH EVERY DAY 01/09/17  Yes Crecencio Mc, MD  Multiple Vitamin (MULTIVITAMIN) tablet Take 1 tablet by mouth daily.     Yes [provider]  PREMARIN vaginal cream 1 TO 2 GRAMS INTRAVAGINALLY TWICE WEEKLY 12/24/15  Yes Crecencio Mc, MD  acyclovir (ZOVIRAX) 400 MG  tablet TAKE 1 TABLET (400 MG TOTAL) BY MOUTH EVERY 4 (FOUR) HOURS WHILE AWAKE. 08/02/16   Crecencio Mc, MD  azithromycin (ZITHROMAX) 250 MG tablet Take 1 tablet (250 mg total) by mouth daily. Take first 2 tablets together, then 1 every day until finished. 01/24/17   Lorin Picket, PA-C  benzonatate (TESSALON) 200 MG capsule Take one cap TID PRN cough 01/24/17   Lorin Picket, PA-C  chlorpheniramine-HYDROcodone [facility]) 10-8 MG/5ML SUER Take 5 mLs by mouth 2 (two) times daily. 01/24/17   Lorin Picket, PA-C  fluticasone (FLONASE) 50 MCG/ACT nasal spray Place 2 sprays into both nostrils daily. 01/24/17   Lorin Picket, PA-C    Family History Family  History  Problem Relation Age of Onset  . Cancer Mother        bilatera, contralateral recurred after 30 yrs  . Stroke Father   . Heart disease Father 16       died of stroke  at 23, tobacco user  . Cancer Father        colon Ca  . Cancer Paternal Grandmother 83       metastatic colon Ca  . Cancer Maternal Uncle        breast x 2 aunts    Social History Social History   Tobacco Use  . Smoking status: Never Smoker  . Smokeless tobacco: Never Used  Substance Use Topics  . Alcohol use: Yes    Alcohol/week: 0.6 oz    Types: 1 Shots of liquor per week    Comment: occasional  . Drug use: No     Allergies   Clindamycin/lincomycin   Review of Systems Review of Systems  Constitutional: Negative for activity change, appetite change, chills, fatigue and fever.  HENT: Positive for congestion, postnasal drip, rhinorrhea, sinus pressure and sinus pain.   All other systems reviewed and are negative.    Physical Exam Triage Vital Signs ED Triage Vitals [01/24/17 1837]  Enc Vitals Group     BP (!) 155/73     Pulse Rate 78     Resp 16     Temp 98 F (36.7 C)     Temp Source Oral     SpO2 98 %     Weight 217 lb (98.4 kg)     Height 5\' 2"  (1.575 m)     Head Circumference      Peak Flow      Pain Score 0     Pain Loc      Pain Edu?      Excl. in [place]?    No data found.  Updated Vital Signs BP (!) 155/73 (BP Location: Left Arm)   Pulse 78   Temp 98 F (36.7 C) (Oral)   Resp 16   Ht 5\' 2"  (1.575 m)   Wt 217 lb (98.4 kg)   SpO2 98%   BMI 39.69 kg/m   Visual Acuity Right Eye Distance:   Left Eye Distance:   Bilateral Distance:    Right Eye Near:   Left Eye Near:    Bilateral Near:     Physical Exam  Constitutional: She is oriented to person, place, and time. She appears well-developed and well-nourished. No distress.  HENT:  Head: Normocephalic.  Right Ear: External ear normal.  Left Ear: External ear normal.  Nose: Nose normal.  Mouth/Throat:  Oropharynx is clear and moist. No oropharyngeal exudate.  Both TMs are dark  Eyes: Pupils are equal,  round, and reactive to light. Right eye exhibits no discharge. Left eye exhibits no discharge.  Neck: Normal range of motion.  Pulmonary/Chest: Effort normal and breath sounds normal.  Musculoskeletal: Normal range of motion.  Neurological: She is alert and oriented to person, place, and time.  Skin: Skin is warm and dry. She is not diaphoretic.  Psychiatric: She has a normal mood and affect. Her behavior is normal. Judgment and thought content normal.  Nursing note and vitals reviewed.    UC Treatments / Results  Labs (all labs ordered are listed, but only abnormal results are displayed) Labs Reviewed - No data to display  EKG  EKG Interpretation None       Radiology No results found.  Procedures Procedures (including critical care time)  Medications Ordered in UC Medications - No data to display   Initial Impression / Assessment and Plan / UC Course  I have reviewed the triage vital signs and the nursing notes.  Pertinent labs & imaging results that were available during my care of the patient were reviewed by me and considered in my medical decision making (see chart for details).     Plan: 1. Test/x-ray results and diagnosis reviewed with patient 2. rx as per orders; risks, benefits, potential side effects reviewed with patient 3. Recommend supportive treatment with thin fluids.  Flonase on a daily basis for the next 3-4 weeks Given you cough suppressants to help with the cough and allow you to rest more comfortably.  I have prescribed azithromycin for your use in 5-7 days if your symptoms have not improved.  Explained that this is most likely a viral illness and will just have to run its course.  However if you are not able to see improvement or worsening take the Z-Pak.  Otherwise I would recommend you follow-up with your primary care physician 4. F/u prn if  symptoms worsen or don't improve   Final Clinical Impressions(s) / UC Diagnoses   Final diagnoses:  Upper respiratory tract infection, unspecified type    ED Discharge Orders        Ordered    fluticasone (FLONASE) 50 MCG/ACT nasal spray  Daily     01/24/17 1940    chlorpheniramine-HYDROcodone (TUSSIONEX PENNKINETIC ER) 10-8 MG/5ML SUER  2 times daily     01/24/17 1940    benzonatate (TESSALON) 200 MG capsule     01/24/17 1940    azithromycin (ZITHROMAX) 250 MG tablet  Daily     01/24/17 1940       Controlled Substance Prescriptions [place] Controlled Substance Registry consulted? Not Applicable   Lorin Picket, PA-C 01/24/17 1949

## 2017-01-24 NOTE — Discharge Instructions (Signed)
Use of Flonase on a daily basis for 3-4 weeks.  Prescribed Z-Pak but do not take it until 5-7 days and take only if your symptoms have not resolved.

## 2017-01-27 ENCOUNTER — Telehealth: Payer: Self-pay | Admitting: Emergency Medicine

## 2017-01-27 NOTE — Telephone Encounter (Signed)
Left message to follow-up with patient regarding her recent visit at [facility].

## 2017-02-15 ENCOUNTER — Other Ambulatory Visit: Payer: Self-pay

## 2017-02-15 MED ORDER — BUPROPION HCL ER (XL) 150 MG PO TB24: 150.0000 mg | ORAL_TABLET | Freq: Every day | ORAL | 1 refills | Status: DC

## 2017-03-15 ENCOUNTER — Other Ambulatory Visit: Payer: Self-pay | Admitting: Internal Medicine

## 2017-03-29 ENCOUNTER — Other Ambulatory Visit: Payer: Self-pay | Admitting: Internal Medicine

## 2017-04-07 ENCOUNTER — Encounter: Payer: Medicare Other | Admitting: Internal Medicine

## 2017-04-11 ENCOUNTER — Other Ambulatory Visit: Payer: Self-pay | Admitting: Internal Medicine

## 2017-04-28 ENCOUNTER — Encounter: Payer: Self-pay | Admitting: Internal Medicine

## 2017-04-28 ENCOUNTER — Other Ambulatory Visit: Payer: Self-pay

## 2017-04-28 ENCOUNTER — Ambulatory Visit (INDEPENDENT_AMBULATORY_CARE_PROVIDER_SITE_OTHER): Payer: Medicare Other | Admitting: Internal Medicine

## 2017-04-28 VITALS — BP 138/72 | HR 81 | Temp 98.4°F | Wt 220.2 lb

## 2017-04-28 DIAGNOSIS — Z0000 Encounter for general adult medical examination without abnormal findings: Secondary | ICD-10-CM

## 2017-04-28 DIAGNOSIS — R7301 Impaired fasting glucose: Secondary | ICD-10-CM | POA: Diagnosis not present

## 2017-04-28 DIAGNOSIS — Z23 Encounter for immunization: Secondary | ICD-10-CM

## 2017-04-28 DIAGNOSIS — Z8639 Personal history of other endocrine, nutritional and metabolic disease: Secondary | ICD-10-CM | POA: Diagnosis not present

## 2017-04-28 DIAGNOSIS — Z6841 Body Mass Index (BMI) 40.0 and over, adult: Secondary | ICD-10-CM | POA: Diagnosis not present

## 2017-04-28 DIAGNOSIS — E559 Vitamin D deficiency, unspecified: Secondary | ICD-10-CM

## 2017-04-28 DIAGNOSIS — E7800 Pure hypercholesterolemia, unspecified: Secondary | ICD-10-CM

## 2017-04-28 LAB — LIPID PANEL
CHOL/HDL RATIO: 3
CHOLESTEROL: 151 mg/dL (ref 0–200)
HDL: 54.7 mg/dL (ref >39.00)
LDL CALC: 87 mg/dL (ref 0–99)
NonHDL: 96.02
TRIGLYCERIDES: 44 mg/dL (ref 0.0–149.0)
VLDL: 8.8 mg/dL (ref 0.0–40.0)

## 2017-04-28 LAB — COMPREHENSIVE METABOLIC PANEL
ALBUMIN: 4.4 g/dL (ref 3.5–5.2)
ALT: 18 U/L (ref 0–35)
AST: 20 U/L (ref 0–37)
Alkaline Phosphatase: 51 U/L (ref 39–117)
BUN: 22 mg/dL (ref 6–23)
CALCIUM: 10.6 mg/dL — AB (ref 8.4–10.5)
CHLORIDE: 102 meq/L (ref 96–112)
CO2: 31 meq/L (ref 19–32)
CREATININE: 0.95 mg/dL (ref 0.40–1.20)
GFR: 62.63 mL/min (ref >60.00)
Glucose, Bld: 101 mg/dL — ABNORMAL HIGH (ref 70–99)
POTASSIUM: 4.1 meq/L (ref 3.5–5.1)
Sodium: 140 mEq/L (ref 135–145)
Total Bilirubin: 0.6 mg/dL (ref 0.2–1.2)
Total Protein: 7.5 g/dL (ref 6.0–8.3)

## 2017-04-28 LAB — VITAMIN D 25 HYDROXY (VIT D DEFICIENCY, FRACTURES): VITD: 45.43 ng/mL (ref 30.00–100.00)

## 2017-04-28 LAB — HEMOGLOBIN A1C: HEMOGLOBIN A1C: 5.7 % (ref 4.6–6.5)

## 2017-04-28 NOTE — Patient Instructions (Signed)
Your bone density teste (DEXA) has been ordered   Health Maintenance for Postmenopausal Women Menopause is a normal process in which your reproductive ability comes to an end. This process happens gradually over a span of months to years, usually between the ages of 73 and 58. Menopause is complete when you have missed 12 consecutive menstrual periods. It is important to talk with your health care provider about some of the most common conditions that affect postmenopausal women, such as heart disease, cancer, and bone loss (osteoporosis). Adopting a healthy lifestyle and getting preventive care can help to promote your health and wellness. Those actions can also lower your chances of developing some of these common conditions. What should I know about menopause? During menopause, you may experience a number of symptoms, such as:  Moderate-to-severe hot flashes.  Night sweats.  Decrease in sex drive.  Mood swings.  Headaches.  Tiredness.  Irritability.  Memory problems.  Insomnia.  Choosing to treat or not to treat menopausal changes is an individual decision that you make with your health care provider. What should I know about hormone replacement therapy and supplements? Hormone therapy products are effective for treating symptoms that are associated with menopause, such as hot flashes and night sweats. Hormone replacement carries certain risks, especially as you become older. If you are thinking about using estrogen or estrogen with progestin treatments, discuss the benefits and risks with your health care provider. What should I know about heart disease and stroke? Heart disease, heart attack, and stroke become more likely as you age. This may be due, in part, to the hormonal changes that your body experiences during menopause. These can affect how your body processes dietary fats, triglycerides, and cholesterol. Heart attack and stroke are both medical emergencies. There are many  things that you can do to help prevent heart disease and stroke:  Have your blood pressure checked at least every 1-2 years. High blood pressure causes heart disease and increases the risk of stroke.  If you are 3-19 years old, ask your health care provider if you should take aspirin to prevent a heart attack or a stroke.  Do not use any tobacco products, including cigarettes, chewing tobacco, or electronic cigarettes. If you need help quitting, ask your health care provider.  It is important to eat a healthy diet and maintain a healthy weight. ? Be sure to include plenty of vegetables, fruits, low-fat dairy products, and lean protein. ? Avoid eating foods that are high in solid fats, added sugars, or salt (sodium).  Get regular exercise. This is one of the most important things that you can do for your health. ? Try to exercise for at least 150 minutes each week. The type of exercise that you do should increase your heart rate and make you sweat. This is known as moderate-intensity exercise. ? Try to do strengthening exercises at least twice each week. Do these in addition to the moderate-intensity exercise.  Know your numbers.Ask your health care provider to check your cholesterol and your blood glucose. Continue to have your blood tested as directed by your health care provider.  What should I know about cancer screening? There are several types of cancer. Take the following steps to reduce your risk and to catch any cancer development as early as possible. Breast Cancer  Practice breast self-awareness. ? This means understanding how your breasts normally appear and feel. ? It also means doing regular breast self-exams. Let your health care provider know about any  changes, no matter how small.  If you are 43 or older, have a clinician do a breast exam (clinical breast exam or CBE) every year. Depending on your age, family history, and medical history, it may be recommended that you  also have a yearly breast X-ray (mammogram).  If you have a family history of breast cancer, talk with your health care provider about genetic screening.  If you are at high risk for breast cancer, talk with your health care provider about having an MRI and a mammogram every year.  Breast cancer (BRCA) gene test is recommended for women who have family members with BRCA-related cancers. Results of the assessment will determine the need for genetic counseling and BRCA1 and for BRCA2 testing. BRCA-related cancers include these types: ? Breast. This occurs in males or females. ? Ovarian. ? Tubal. This may also be called fallopian tube cancer. ? Cancer of the abdominal or pelvic lining (peritoneal cancer). ? Prostate. ? Pancreatic.  Cervical, Uterine, and Ovarian Cancer Your health care provider may recommend that you be screened regularly for cancer of the pelvic organs. These include your ovaries, uterus, and vagina. This screening involves a pelvic exam, which includes checking for microscopic changes to the surface of your cervix (Pap test).  For women ages 21-65, health care providers may recommend a pelvic exam and a Pap test every three years. For women ages 32-65, they may recommend the Pap test and pelvic exam, combined with testing for human papilloma virus (HPV), every five years. Some types of HPV increase your risk of cervical cancer. Testing for HPV may also be done on women of any age who have unclear Pap test results.  Other health care providers may not recommend any screening for nonpregnant women who are considered low risk for pelvic cancer and have no symptoms. Ask your health care provider if a screening pelvic exam is right for you.  If you have had past treatment for cervical cancer or a condition that could lead to cancer, you need Pap tests and screening for cancer for at least 20 years after your treatment. If Pap tests have been discontinued for you, your risk factors  (such as having a new sexual partner) need to be reassessed to determine if you should start having screenings again. Some women have medical problems that increase the chance of getting cervical cancer. In these cases, your health care provider may recommend that you have screening and Pap tests more often.  If you have a family history of uterine cancer or ovarian cancer, talk with your health care provider about genetic screening.  If you have vaginal bleeding after reaching menopause, tell your health care provider.  There are currently no reliable tests available to screen for ovarian cancer.  Lung Cancer Lung cancer screening is recommended for adults 39-45 years old who are at high risk for lung cancer because of a history of smoking. A yearly low-dose CT scan of the lungs is recommended if you:  Currently smoke.  Have a history of at least 30 pack-years of smoking and you currently smoke or have quit within the past 15 years. A pack-year is smoking an average of one pack of cigarettes per day for one year.  Yearly screening should:  Continue until it has been 15 years since you quit.  Stop if you develop a health problem that would prevent you from having lung cancer treatment.  Colorectal Cancer  This type of cancer can be detected and can often  be prevented.  Routine colorectal cancer screening usually begins at age 55 and continues through age 23.  If you have risk factors for colon cancer, your health care provider may recommend that you be screened at an earlier age.  If you have a family history of colorectal cancer, talk with your health care provider about genetic screening.  Your health care provider may also recommend using home test kits to check for hidden blood in your stool.  A small camera at the end of a tube can be used to examine your colon directly (sigmoidoscopy or colonoscopy). This is done to check for the earliest forms of colorectal cancer.  Direct  examination of the colon should be repeated every 5-10 years until age 82. However, if early forms of precancerous polyps or small growths are found or if you have a family history or genetic risk for colorectal cancer, you may need to be screened more often.  Skin Cancer  Check your skin from head to toe regularly.  Monitor any moles. Be sure to tell your health care provider: ? About any new moles or changes in moles, especially if there is a change in a mole's shape or color. ? If you have a mole that is larger than the size of a pencil eraser.  If any of your family members has a history of skin cancer, especially at a young age, talk with your health care provider about genetic screening.  Always use sunscreen. Apply sunscreen liberally and repeatedly throughout the day.  Whenever you are outside, protect yourself by wearing long sleeves, pants, a wide-brimmed hat, and sunglasses.  What should I know about osteoporosis? Osteoporosis is a condition in which bone destruction happens more quickly than new bone creation. After menopause, you may be at an increased risk for osteoporosis. To help prevent osteoporosis or the bone fractures that can happen because of osteoporosis, the following is recommended:  If you are 53-79 years old, get at least 1,000 mg of calcium and at least 600 mg of vitamin D per day.  If you are older than age 60 but younger than age 20, get at least 1,200 mg of calcium and at least 600 mg of vitamin D per day.  If you are older than age 25, get at least 1,200 mg of calcium and at least 800 mg of vitamin D per day.  Smoking and excessive alcohol intake increase the risk of osteoporosis. Eat foods that are rich in calcium and vitamin D, and do weight-bearing exercises several times each week as directed by your health care provider. What should I know about how menopause affects my mental health? Depression may occur at any age, but it is more common as you become  older. Common symptoms of depression include:  Low or sad mood.  Changes in sleep patterns.  Changes in appetite or eating patterns.  Feeling an overall lack of motivation or enjoyment of activities that you previously enjoyed.  Frequent crying spells.  Talk with your health care provider if you think that you are experiencing depression. What should I know about immunizations? It is important that you get and maintain your immunizations. These include:  Tetanus, diphtheria, and pertussis (Tdap) booster vaccine.  Influenza every year before the flu season begins.  Pneumonia vaccine.  Shingles vaccine.  Your health care provider may also recommend other immunizations. This information is not intended to replace advice given to you by your health care provider. Make sure you discuss any questions  you have with your health care provider. Document Released: 02/25/2005 Document Revised: 07/24/2015 Document Reviewed: 10/07/2014 Elsevier Interactive Patient Education  2018 [employer].

## 2017-04-28 NOTE — Progress Notes (Addendum)
Patient ID: Michele Garza, female    DOB: 03-Dec-1950  Age: 67 y.o. MRN: [id number]  The patient is here for welcome to  Medicare  examination and management of other chronic and acute problems.   The risk factors are reflected in the social history.  The roster of all physicians providing medical care to patient - is listed in the Snapshot section of the chart.  Activities of daily living:  The patient is 100% independent in all ADLs: dressing, toileting, feeding as well as independent mobility  Home safety : The patient has smoke detectors in the home. They wear seatbelts.  There are no firearms at home. There is no violence in the home.   There is no risks for hepatitis, STDs or HIV. There is no   history of blood transfusion. They have no travel history to infectious disease endemic areas of the world.  The patient has seen her dentist in the last six month. She has seen their eye doctor in the last year. She denies any hearing difficulty with regard to whispered voices and some television programs. She does not have excessive sun exposure. Discussed the need for sun protection: hats, long sleeves and use of sunscreen if there is significant sun exposure.   Diet: the importance of a healthy diet is discussed. They do have a healthy diet.  The benefits of regular aerobic exercise were discussed. She does not exercise.    Depression screen: there are no signs or vegative symptoms of depression- irritability, change in appetite, anhedonia, sadness/tearfullness.  Cognitive assessment: the patient manages all their financial and personal affairs and is actively engaged. They could relate day,date,year and events; recalled 3/3 objects at 3 minutes; performed clock-face test normally.    The following portions of the patient's history were reviewed and updated as appropriate: allergies, current medications, past family history, past medical history,  past surgical history, past social history   and problem list.  Visual acuity was not assessed per patient preference since she has regular follow up with her ophthalmologist. Hearing and body mass index were assessed and reviewed.   During the course of the visit the patient was educated and counseled about appropriate screening and preventive services including : fall prevention , diabetes screening, nutrition counseling, colorectal cancer screening, and recommended immunizations.    During the course of the visit , End of Life objectives were discussed at length,  Patient does not have a living will in place or a healthcare power of attorney.  She was given printed information about advance directives and encouraged to return after discussing with her family,   CC: The primary encounter diagnosis was Welcome to Medicare preventive visit. Diagnoses of Personal history of estrogen deficiency, Pure hypercholesterolemia, Vitamin D deficiency, Impaired fasting glucose, Need for vaccination, and Morbid obesity with BMI of 40.0-44.9, adult ([place]) were also pertinent to this visit.  Left knee pain,  History of arthoscopic surgery.  Pain is mild, and dull .  Not aggravated by walking.  Grieving over loss of best friend  mother is 20. Lives alone  In [place] , independent,  Uses a walker.    History Michele Garza has a past medical history of Depression, Diverticulosis of colon, GERD (gastroesophageal reflux disease), History of colonoscopy, and Hypertension.   She has a past surgical history that includes Breast surgery.   Her family history includes Cancer in her father, maternal uncle, and mother; Cancer (age of onset: 67) in her paternal grandmother; Heart disease (age of  onset: 35) in her father; Stroke in her father.She reports that she has never smoked. She has never used smokeless tobacco. She reports that she drinks about 0.6 oz of alcohol per week. She reports that she does not use drugs.  Outpatient Medications Prior to Visit   Medication Sig Dispense Refill  . acyclovir (ZOVIRAX) 400 MG tablet TAKE 1 TABLET (400 MG TOTAL) BY MOUTH EVERY 4 (FOUR) HOURS WHILE AWAKE. 28 tablet 0  . aspirin 81 MG tablet Take 81 mg by mouth daily.      Marland Kitchen atorvastatin (LIPITOR) 20 MG tablet TAKE 1 TABLET BY MOUTH EVERY DAY 90 tablet 0  . buPROPion (WELLBUTRIN XL) 150 MG 24 hr tablet Take 1 tablet (150 mg total) by mouth daily. 90 tablet 1  . calcium citrate-vitamin D (CITRACAL+D) 315-200 MG-UNIT per tablet Take 1 tablet by mouth daily.      . fish oil-omega-3 fatty acids 1000 MG capsule Take 2 g by mouth daily.    Marland Kitchen losartan (COZAAR) 50 MG tablet TAKE 1 TABLET (50 MG TOTAL) BY MOUTH DAILY. 90 tablet 1  . metoprolol succinate (TOPROL-XL) 25 MG 24 hr tablet TAKE 1 TABLET BY MOUTH EVERY DAY 90 tablet 1  . Multiple Vitamin (MULTIVITAMIN) tablet Take 1 tablet by mouth daily.      Marland Kitchen PREMARIN vaginal cream 1 TO 2 GRAMS INTRAVAGINALLY TWICE WEEKLY 30 g 2  . atorvastatin (LIPITOR) 20 MG tablet TAKE 1 TABLET BY MOUTH EVERY DAY (Patient not taking: Reported on 04/28/2017) 90 tablet 1  . azithromycin (ZITHROMAX) 250 MG tablet Take 1 tablet (250 mg total) by mouth daily. Take first 2 tablets together, then 1 every day until finished. (Patient not taking: Reported on 04/28/2017) 6 tablet 0  . benzonatate (TESSALON) 200 MG capsule Take one cap TID PRN cough (Patient not taking: Reported on 04/28/2017) 30 capsule 0  . chlorpheniramine-HYDROcodone (TUSSIONEX PENNKINETIC ER) 10-8 MG/5ML SUER Take 5 mLs by mouth 2 (two) times daily. (Patient not taking: Reported on 04/28/2017) 115 mL 0  . fluticasone (FLONASE) 50 MCG/ACT nasal spray Place 2 sprays into both nostrils daily. (Patient not taking: Reported on 04/28/2017) 16 g 1   No facility-administered medications prior to visit.     Review of Systems   Patient denies headache, fevers, malaise, unintentional weight loss, skin rash, eye pain, sinus congestion and sinus pain, sore throat, dysphagia,  hemoptysis ,  cough, dyspnea, wheezing, chest pain, palpitations, orthopnea, edema, abdominal pain, nausea, melena, diarrhea, constipation, flank pain, dysuria, hematuria, urinary  Frequency, nocturia, numbness, tingling, seizures,  Focal weakness, Loss of consciousness,  Tremor, insomnia, depression, anxiety, and suicidal ideation.      Objective:  BP 138/72 (BP Location: Left Arm, Patient Position: Sitting, Cuff Size: Normal)   Pulse 81   Temp 98.4 F (36.9 C)   Wt 220 lb 3.2 oz (99.9 kg)   SpO2 96%   BMI 40.28 kg/m   Physical Exam   General appearance: alert, cooperative and appears stated age Head: Normocephalic, without obvious abnormality, atraumatic Eyes: conjunctivae/corneas clear. PERRL, EOM's intact. Fundi benign. Ears: normal TM's and external ear canals both ears Nose: Nares normal. Septum midline. Mucosa normal. No drainage or sinus tenderness. Throat: lips, mucosa, and tongue normal; teeth and gums normal Neck: no adenopathy, no carotid bruit, no JVD, supple, symmetrical, trachea midline and thyroid not enlarged, symmetric, no tenderness/mass/nodules Lungs: clear to auscultation bilaterally Breasts: normal appearance, no masses or tenderness Heart: regular rate and rhythm, S1, S2 normal, no murmur, click,  rub or gallop Abdomen: soft, non-tender; bowel sounds normal; no masses,  no organomegaly Extremities: extremities normal, atraumatic, no cyanosis or edema Pulses: 2+ and symmetric Skin: Skin color, texture, turgor normal. No rashes or lesions Neurologic: Alert and oriented X 3, normal strength and tone. Normal symmetric reflexes. Normal coordination and gait.      Assessment & Plan:   Problem List Items Addressed This Visit    Impaired fasting glucose   Relevant Orders   Comprehensive metabolic panel (Completed)   Hemoglobin A1c (Completed)   Hyperlipidemia   Relevant Orders   Lipid panel (Completed)   Welcome to Medicare preventive visit - Primary    Welcome to  Medicare exam was done as well as a comprehensive physical exam and management of acute and chronic conditions .  During the course of the visit the patient was educated and counseled about appropriate screening and preventive services including :  12 lead EKG, diabetes screening, lipid analysis with projected  10 year  risk for CAD , nutrition counseling, colorectal cancer screening, and recommended immunizations.  Printed recommendations for health maintenance screenings was given.   12 lead EGG was done today and interpreted by me as normal.   DEXA scan ordered.       Relevant Orders   EKG 12-Lead (Completed)   Morbid obesity with BMI of 40.0-44.9, adult ([place])    I have addressed  BMI and recommended a low glycemic index diet utilizing smaller more frequent meals to increase metabolism.  I have also recommended that patient start exercising with a goal of 30 minutes of aerobic exercise a minimum of 5 days per week.        Other Visit Diagnoses    Personal history of estrogen deficiency       Relevant Orders   DG Bone Density   Vitamin D deficiency       Relevant Orders   VITAMIN D 25 Hydroxy (Vit-D Deficiency, Fractures) (Completed)   Need for vaccination       Relevant Orders   Pneumococcal conjugate vaccine 13-valent (Completed)      I am having Cora Collum "Cora Collum" maintain her multivitamin, aspirin, calcium citrate-vitamin D, fish oil-omega-3 fatty acids, PREMARIN, acyclovir, atorvastatin, losartan, fluticasone, chlorpheniramine-HYDROcodone, benzonatate, azithromycin, buPROPion, atorvastatin, and metoprolol succinate.  No orders of the defined types were placed in this encounter.   There are no discontinued medications.  Follow-up: Return in about 6 months (around 10/28/2017).   Crecencio Mc, MD

## 2017-04-30 NOTE — Assessment & Plan Note (Signed)
I have addressed  BMI and recommended a low glycemic index diet utilizing smaller more frequent meals to increase metabolism.  I have also recommended that patient start exercising with a goal of 30 minutes of aerobic exercise a minimum of 5 days per week.  

## 2017-04-30 NOTE — Assessment & Plan Note (Addendum)
Welcome to Medicare exam was done as well as a comprehensive physical exam and management of acute and chronic conditions .  During the course of the visit the patient was educated and counseled about appropriate screening and preventive services including :  12 lead EKG, diabetes screening, lipid analysis with projected  10 year  risk for CAD , nutrition counseling, colorectal cancer screening, and recommended immunizations.  Printed recommendations for health maintenance screenings was given.   12 lead EGG was done today and interpreted by me as normal.   DEXA scan ordered.

## 2017-05-02 ENCOUNTER — Encounter: Payer: Self-pay | Admitting: Internal Medicine

## 2017-05-02 ENCOUNTER — Other Ambulatory Visit: Payer: Self-pay | Admitting: Internal Medicine

## 2017-05-02 MED ORDER — CHOLECALCIFEROL 50 MCG (2000 UT) PO CAPS: 1.0000 | ORAL_CAPSULE | Freq: Every day | ORAL | Status: DC

## 2017-05-15 LAB — HM MAMMOGRAPHY

## 2017-05-18 NOTE — Progress Notes (Addendum)
Note amended

## 2017-06-17 ENCOUNTER — Other Ambulatory Visit: Payer: Self-pay | Admitting: Internal Medicine

## 2017-06-27 ENCOUNTER — Telehealth: Payer: Self-pay | Admitting: Internal Medicine

## 2017-06-27 LAB — HM DEXA SCAN: HM Dexa Scan: NORMAL

## 2017-06-27 NOTE — Telephone Encounter (Signed)
Your DEXA scan was normal.  No osteoporosis.  Continue 1200 mg calcium daily through diet and supplements,.  1000 IUS Vit D3 daily and regular weight bearing exercise. Repeat DEXA in 5 years

## 2017-07-02 ENCOUNTER — Other Ambulatory Visit: Payer: Self-pay | Admitting: Internal Medicine

## 2017-08-23 ENCOUNTER — Other Ambulatory Visit: Payer: Self-pay | Admitting: Internal Medicine

## 2017-08-23 DIAGNOSIS — Z8619 Personal history of other infectious and parasitic diseases: Secondary | ICD-10-CM

## 2017-09-05 ENCOUNTER — Other Ambulatory Visit: Payer: Self-pay | Admitting: Internal Medicine

## 2017-09-29 ENCOUNTER — Other Ambulatory Visit: Payer: Self-pay | Admitting: Internal Medicine

## 2017-10-05 ENCOUNTER — Other Ambulatory Visit: Payer: Self-pay | Admitting: Internal Medicine

## 2017-11-03 ENCOUNTER — Ambulatory Visit: Payer: Medicare Other | Admitting: Internal Medicine

## 2017-12-02 ENCOUNTER — Other Ambulatory Visit: Payer: Self-pay | Admitting: Internal Medicine

## 2017-12-08 ENCOUNTER — Encounter: Payer: Self-pay | Admitting: Internal Medicine

## 2017-12-08 ENCOUNTER — Ambulatory Visit: Payer: Medicare Other | Admitting: Internal Medicine

## 2017-12-08 VITALS — BP 142/90 | HR 81 | Temp 98.4°F | Resp 15 | Ht 62.0 in | Wt 223.4 lb

## 2017-12-08 DIAGNOSIS — M25562 Pain in left knee: Secondary | ICD-10-CM | POA: Diagnosis not present

## 2017-12-08 DIAGNOSIS — E7800 Pure hypercholesterolemia, unspecified: Secondary | ICD-10-CM

## 2017-12-08 DIAGNOSIS — I10 Essential (primary) hypertension: Secondary | ICD-10-CM | POA: Diagnosis not present

## 2017-12-08 DIAGNOSIS — R7301 Impaired fasting glucose: Secondary | ICD-10-CM

## 2017-12-08 DIAGNOSIS — M25561 Pain in right knee: Secondary | ICD-10-CM

## 2017-12-08 DIAGNOSIS — G8929 Other chronic pain: Secondary | ICD-10-CM

## 2017-12-08 DIAGNOSIS — Z23 Encounter for immunization: Secondary | ICD-10-CM

## 2017-12-08 MED ORDER — DICLOFENAC SODIUM 1 % TD GEL: 2.0000 g | Freq: Four times a day (QID) | TRANSDERMAL | 5 refills | Status: DC

## 2017-12-08 NOTE — Progress Notes (Signed)
Subjective:  Patient ID: Michele Garza, female    DOB: 01-Oct-1950  Age: 67 y.o. MRN: [id number]  CC: The primary encounter diagnosis was Chronic pain of left knee. Diagnoses of Impaired fasting glucose, Hypertension, unspecified type, Pure hypercholesterolemia, Need for immunization against influenza, and Chronic pain of both knees were also pertinent to this visit.  HPI Michele Garza presents for 6 month follow up on hypertension,  Hyperlipidemia, and  IPG, and morbid obesity (BMI 40)    .cc:  bilateral knee pain  Has gotten worse  Since her vacation in  late October after going [place].  Has been using 800 mg motrin at night,  Knees swell,  .  History of left knee arthroscopy 15 yrs ago. Wants to see ortho Juleen China at [facility] .  Hypertension: patient s taking her medications as directed and until recently was checking blood pressure until recently about twice weekly at home.  Readings have been for the most part < 140/80 at rest . Patient is following a reduce salt diet most days and is taking medications as prescribed  Outpatient Medications Prior to Visit  Medication Sig Dispense Refill  . aspirin 81 MG tablet Take 81 mg by mouth daily.      Marland Kitchen atorvastatin (LIPITOR) 20 MG tablet TAKE 1 TABLET BY MOUTH EVERY DAY 90 tablet 1  . buPROPion (WELLBUTRIN XL) 150 MG 24 hr tablet TAKE 1 TABLET BY MOUTH EVERY DAY 90 tablet 0  . calcium citrate-vitamin D (CITRACAL+D) 315-200 MG-UNIT per tablet Take 1 tablet by mouth daily.      . Cholecalciferol (D3 HIGH POTENCY) 2000 units CAPS Take 1 capsule (2,000 Units total) by mouth daily. 30 each   . fish oil-omega-3 fatty acids 1000 MG capsule Take 2 g by mouth daily.    Marland Kitchen losartan (COZAAR) 50 MG tablet TAKE 1 TABLET BY MOUTH EVERY DAY 90 tablet 1  . metoprolol succinate (TOPROL-XL) 25 MG 24 hr tablet TAKE 1 TABLET BY MOUTH EVERY DAY 90 tablet 1  . Multiple Vitamin (MULTIVITAMIN) tablet Take 1 tablet by mouth daily.      Marland Kitchen  PREMARIN vaginal cream 1 TO 2 GRAMS INTRAVAGINALLY TWICE WEEKLY 30 g 2  . acyclovir (ZOVIRAX) 400 MG tablet TAKE 1 TABLET (400 MG TOTAL) BY MOUTH EVERY 4 (FOUR) HOURS WHILE AWAKE. (Patient not taking: Reported on 12/08/2017) 28 tablet 1  . fluticasone (FLONASE) 50 MCG/ACT nasal spray Place 2 sprays into both nostrils daily. (Patient not taking: Reported on 04/28/2017) 16 g 1  . azithromycin (ZITHROMAX) 250 MG tablet Take 1 tablet (250 mg total) by mouth daily. Take first 2 tablets together, then 1 every day until finished. (Patient not taking: Reported on 04/28/2017) 6 tablet 0  . benzonatate (TESSALON) 200 MG capsule Take one cap TID PRN cough (Patient not taking: Reported on 04/28/2017) 30 capsule 0  . chlorpheniramine-HYDROcodone (TUSSIONEX PENNKINETIC ER) 10-8 MG/5ML SUER Take 5 mLs by mouth 2 (two) times daily. (Patient not taking: Reported on 04/28/2017) 115 mL 0   No facility-administered medications prior to visit.     Review of Systems;  Patient denies headache, fevers, malaise, unintentional weight loss, skin rash, eye pain, sinus congestion and sinus pain, sore throat, dysphagia,  hemoptysis , cough, dyspnea, wheezing, chest pain, palpitations, orthopnea, edema, abdominal pain, nausea, melena, diarrhea, constipation, flank pain, dysuria, hematuria, urinary  Frequency, nocturia, numbness, tingling, seizures,  Focal weakness, Loss of consciousness,  Tremor, insomnia, depression, anxiety, and suicidal ideation.  Objective:  BP (!) 142/90 (BP Location: Left Arm, Patient Position: Sitting, Cuff Size: Large)   Pulse 81   Temp 98.4 F (36.9 C) (Oral)   Resp 15   Ht 5\' 2"  (1.575 m)   Wt 223 lb 6.4 oz (101.3 kg)   SpO2 97%   BMI 40.86 kg/m   BP Readings from Last 3 Encounters:  12/08/17 (!) 142/90  04/28/17 138/72  01/24/17 (!) 155/73    Wt Readings from Last 3 Encounters:  12/08/17 223 lb 6.4 oz (101.3 kg)  04/28/17 220 lb 3.2 oz (99.9 kg)  01/24/17 217 lb (98.4 kg)     General appearance: alert, cooperative and appears stated age Ears: normal TM's and external ear canals both ears Throat: lips, mucosa, and tongue normal; teeth and gums normal Neck: no adenopathy, no carotid bruit, supple, symmetrical, trachea midline and thyroid not enlarged, symmetric, no tenderness/mass/nodules Back: symmetric, no curvature. ROM normal. No CVA tenderness. Lungs: clear to auscultation bilaterally Heart: regular rate and rhythm, S1, S2 normal, no murmur, click, rub or gallop Abdomen: soft, non-tender; bowel sounds normal; no masses,  no organomegaly Pulses: 2+ and symmetric Skin: Skin color, texture, turgor normal. No rashes or lesions Lymph nodes: Cervical, supraclavicular, and axillary nodes normal.  Lab Results  Component Value Date   HGBA1C 5.5 12/08/2017   HGBA1C 5.7 04/28/2017   HGBA1C 5.6 11/11/2016    Lab Results  Component Value Date   CREATININE 1.01 (H) 12/08/2017   CREATININE 0.95 04/28/2017   CREATININE 1.02 11/11/2016    Lab Results  Component Value Date   WBC 7.9 04/03/2015   HGB 14.1 04/03/2015   HCT 41.1 04/03/2015   PLT 217 04/03/2015   GLUCOSE 98 12/08/2017   CHOL 164 12/08/2017   TRIG 68 12/08/2017   HDL 53 12/08/2017   LDLDIRECT 108.0 11/11/2016   LDLCALC 96 12/08/2017   ALT 23 12/08/2017   AST 24 12/08/2017   NA 139 12/08/2017   K 4.1 12/08/2017   CL 101 12/08/2017   CREATININE 1.01 (H) 12/08/2017   BUN 21 12/08/2017   CO2 26 12/08/2017   TSH 0.66 04/03/2015   HGBA1C 5.5 12/08/2017   MICROALBUR <0.2 12/08/2017    No results found.  Assessment & Plan:   Problem List Items Addressed This Visit    Hyperlipidemia    LDL and triglycerides are at goal on current medications.  she has no side effects and liver enzymes are normal. No changes today   Lab Results  Component Value Date   CHOL 164 12/08/2017   HDL 53 12/08/2017   LDLCALC 96 12/08/2017   LDLDIRECT 108.0 11/11/2016   TRIG 68 12/08/2017   CHOLHDL 3.1  12/08/2017   Lab Results  Component Value Date   ALT 23 12/08/2017   AST 24 12/08/2017   ALKPHOS 51 04/28/2017   BILITOT 0.6 12/08/2017         Relevant Orders   Lipid panel (Completed)   Hypertension    Her  BP is not at goal currently.  Medications reviewed and compliance assessed via patient report.  Renal function and electolytes assessed.  Use of NSAIDs, oral decongestants and other medications/supplements reviewed  .the following changes were made to his regimen:ibuprofen stopped and voltaren gel prescribed.  Repeat BP in one week.   Lab Results  Component Value Date   CREATININE 1.01 (H) 12/08/2017   Lab Results  Component Value Date   NA 139 12/08/2017   K 4.1 12/08/2017  CL 101 12/08/2017   CO2 26 12/08/2017   Lab Results  Component Value Date   MICROALBUR <0.2 12/08/2017         Relevant Orders   Comprehensive metabolic panel (Completed)   Microalbumin / creatinine urine ratio (Completed)   Impaired fasting glucose    A1c remains in the  Normal range   Lab Results  Component Value Date   HGBA1C 5.5 12/08/2017         Relevant Orders   Hemoglobin A1c (Completed)   Knee pain, bilateral    Left > right  Aggravated by walking/hiking.  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 Referral to [place] per patient request. .  Voltaren gel trial given BP elevation using motrin         Other Visit Diagnoses    Chronic pain of left knee    -  Primary   Relevant Orders   Ambulatory referral to Orthopedic Surgery   Need for immunization against influenza       Relevant Orders   Flu Vaccine QUAD 36+ mos IM (Completed)    A total of 25 minutes of face to face time was spent with patient more than half of which was spent in counselling about the above mentioned conditions  and coordination of care   I have discontinued Michele Collum "Olin Hauser Laverne"'s chlorpheniramine-HYDROcodone, benzonatate, and  azithromycin. I am also having her start on diclofenac sodium. Additionally, I am having her maintain her multivitamin, aspirin, calcium citrate-vitamin D, fish oil-omega-3 fatty acids, fluticasone, Cholecalciferol, losartan, PREMARIN, acyclovir, atorvastatin, metoprolol succinate, and buPROPion.  Meds ordered this encounter  Medications  . diclofenac sodium (VOLTAREN) 1 % GEL    Sig: Apply 2 g topically 4 (four) times daily.    Dispense:  100 g    Refill:  5    Medications Discontinued During This Encounter  Medication Reason  . chlorpheniramine-HYDROcodone (TUSSIONEX PENNKINETIC ER) 10-8 MG/5ML SUER Error  . azithromycin (ZITHROMAX) 250 MG tablet Completed Course  . benzonatate (TESSALON) 200 MG capsule Completed Course    Follow-up: No follow-ups on file.   Crecencio Mc, MD

## 2017-12-08 NOTE — Patient Instructions (Addendum)
Your ibuprofen may be  raising your blood pressure    I am giving you a Trial of voltaren gel  To use on your On knee  You can use it up to 4 times daily.  Instead of  Motrin (ibuprofen)    You can add up to 2000 mg of acetominophen (tylenol) every day safely  In divided doses (500 mg every 6 hours  Or 1000 mg every 12 hours.)  Get your blood pressure rechecked In a week  Referral to [facility] Dr Fritzi Mandes has been made.

## 2017-12-09 LAB — COMPREHENSIVE METABOLIC PANEL
AG RATIO: 1.9 (calc) (ref 1.0–2.5)
ALT: 23 U/L (ref 6–29)
AST: 24 U/L (ref 10–35)
Albumin: 4.6 g/dL (ref 3.6–5.1)
Alkaline phosphatase (APISO): 58 U/L (ref 33–130)
BUN / CREAT RATIO: 21 (calc) (ref 6–22)
BUN: 21 mg/dL (ref 7–25)
CHLORIDE: 101 mmol/L (ref 98–110)
CO2: 26 mmol/L (ref 20–32)
Calcium: 11.3 mg/dL — ABNORMAL HIGH (ref 8.6–10.4)
Creat: 1.01 mg/dL — ABNORMAL HIGH (ref 0.50–0.99)
GLOBULIN: 2.4 g/dL (ref 1.9–3.7)
Glucose, Bld: 98 mg/dL (ref 65–99)
Potassium: 4.1 mmol/L (ref 3.5–5.3)
SODIUM: 139 mmol/L (ref 135–146)
TOTAL PROTEIN: 7 g/dL (ref 6.1–8.1)
Total Bilirubin: 0.6 mg/dL (ref 0.2–1.2)

## 2017-12-09 LAB — LIPID PANEL
Cholesterol: 164 mg/dL (ref <200)
HDL: 53 mg/dL (ref >50)
LDL CHOLESTEROL (CALC): 96 mg/dL
NON-HDL CHOLESTEROL (CALC): 111 mg/dL (ref <130)
TRIGLYCERIDES: 68 mg/dL (ref <150)
Total CHOL/HDL Ratio: 3.1 (calc) (ref <5.0)

## 2017-12-09 LAB — HEMOGLOBIN A1C
EAG (MMOL/L): 6.2 (calc)
Hgb A1c MFr Bld: 5.5 % of total Hgb (ref <5.7)
MEAN PLASMA GLUCOSE: 111 (calc)

## 2017-12-09 LAB — MICROALBUMIN / CREATININE URINE RATIO: CREATININE, URINE: 62 mg/dL (ref 20–275)

## 2017-12-10 ENCOUNTER — Other Ambulatory Visit: Payer: Self-pay | Admitting: Internal Medicine

## 2017-12-10 DIAGNOSIS — M25561 Pain in right knee: Secondary | ICD-10-CM

## 2017-12-10 DIAGNOSIS — I5043 Acute on chronic combined systolic (congestive) and diastolic (congestive) heart failure: Secondary | ICD-10-CM | POA: Insufficient documentation

## 2017-12-10 DIAGNOSIS — M25562 Pain in left knee: Secondary | ICD-10-CM

## 2017-12-10 NOTE — Assessment & Plan Note (Signed)
Her  BP is not at goal currently.  Medications reviewed and compliance assessed via patient report.  Renal function and electolytes assessed.  Use of NSAIDs, oral decongestants and other medications/supplements reviewed  .the following changes were made to his regimen:ibuprofen stopped and voltaren gel prescribed.  Repeat BP in one week.   Lab Results  Component Value Date   CREATININE 1.01 (H) 12/08/2017   Lab Results  Component Value Date   NA 139 12/08/2017   K 4.1 12/08/2017   CL 101 12/08/2017   CO2 26 12/08/2017   Lab Results  Component Value Date   MICROALBUR <0.2 12/08/2017

## 2017-12-10 NOTE — Assessment & Plan Note (Signed)
A1c remains in the  Normal range   Lab Results  Component Value Date   HGBA1C 5.5 12/08/2017    

## 2017-12-10 NOTE — Assessment & Plan Note (Signed)
LDL and triglycerides are at goal on current medications.  she has no side effects and liver enzymes are normal. No changes today   Lab Results  Component Value Date   CHOL 164 12/08/2017   HDL 53 12/08/2017   LDLCALC 96 12/08/2017   LDLDIRECT 108.0 11/11/2016   TRIG 68 12/08/2017   CHOLHDL 3.1 12/08/2017   Lab Results  Component Value Date   ALT 23 12/08/2017   AST 24 12/08/2017   ALKPHOS 51 04/28/2017   BILITOT 0.6 12/08/2017

## 2017-12-10 NOTE — Assessment & Plan Note (Addendum)
Left > right  Aggravated by walking/hiking.  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 Referral to [place] per patient request. .  Voltaren gel trial given BP elevation using motrin

## 2017-12-10 NOTE — Assessment & Plan Note (Signed)
Advised to stop calcium supplementation  TRC one week for recheck with PTH and Vitamin D as well

## 2017-12-11 ENCOUNTER — Other Ambulatory Visit: Payer: Self-pay | Admitting: Internal Medicine

## 2017-12-11 DIAGNOSIS — R300 Dysuria: Secondary | ICD-10-CM

## 2017-12-11 NOTE — Progress Notes (Signed)
ua

## 2017-12-12 ENCOUNTER — Other Ambulatory Visit (INDEPENDENT_AMBULATORY_CARE_PROVIDER_SITE_OTHER): Payer: Medicare Other

## 2017-12-12 DIAGNOSIS — R300 Dysuria: Secondary | ICD-10-CM

## 2017-12-12 LAB — URINALYSIS, ROUTINE W REFLEX MICROSCOPIC
Bilirubin Urine: NEGATIVE
Ketones, ur: NEGATIVE
NITRITE: NEGATIVE
Specific Gravity, Urine: <=1.005 — AB (ref 1.000–1.030)
TOTAL PROTEIN, URINE-UPE24: NEGATIVE
URINE GLUCOSE: NEGATIVE
UROBILINOGEN UA: 0.2 (ref 0.0–1.0)
pH: 5.5 (ref 5.0–8.0)

## 2017-12-13 LAB — URINE CULTURE
MICRO NUMBER: 91424707
SPECIMEN QUALITY: ADEQUATE

## 2017-12-18 ENCOUNTER — Other Ambulatory Visit: Payer: Self-pay | Admitting: Internal Medicine

## 2017-12-22 ENCOUNTER — Other Ambulatory Visit (INDEPENDENT_AMBULATORY_CARE_PROVIDER_SITE_OTHER): Payer: Medicare Other

## 2017-12-22 NOTE — Addendum Note (Signed)
Addended by: Arby Barrette on: 12/22/2017 03:15 PM   Modules accepted: Orders

## 2017-12-23 LAB — BASIC METABOLIC PANEL
BUN: 20 mg/dL (ref 7–25)
CHLORIDE: 100 mmol/L (ref 98–110)
CO2: 25 mmol/L (ref 20–32)
CREATININE: 0.95 mg/dL (ref 0.50–0.99)
Calcium: 10.2 mg/dL (ref 8.6–10.4)
Glucose, Bld: 96 mg/dL (ref 65–99)
POTASSIUM: 3.8 mmol/L (ref 3.5–5.3)
SODIUM: 137 mmol/L (ref 135–146)

## 2017-12-23 LAB — VITAMIN D 25 HYDROXY (VIT D DEFICIENCY, FRACTURES): Vit D, 25-Hydroxy: 52 ng/mL (ref 30–100)

## 2017-12-25 LAB — PTH, INTACT AND CALCIUM
CALCIUM: 9.7 mg/dL (ref 8.6–10.4)
PTH: 28 pg/mL (ref 14–64)

## 2018-03-04 ENCOUNTER — Other Ambulatory Visit: Payer: Self-pay | Admitting: Internal Medicine

## 2018-04-03 ENCOUNTER — Other Ambulatory Visit: Payer: Self-pay | Admitting: Internal Medicine

## 2018-04-25 ENCOUNTER — Other Ambulatory Visit: Payer: Self-pay | Admitting: Internal Medicine

## 2018-04-25 MED ORDER — LOSARTAN POTASSIUM 25 MG PO TABS: 50.0000 mg | ORAL_TABLET | Freq: Every day | ORAL | 5 refills | Status: DC

## 2018-05-11 ENCOUNTER — Encounter: Payer: Medicare Other | Admitting: Internal Medicine

## 2018-05-18 ENCOUNTER — Encounter: Payer: Medicare Other | Admitting: Internal Medicine

## 2018-06-06 ENCOUNTER — Other Ambulatory Visit: Payer: Self-pay | Admitting: Internal Medicine

## 2018-07-06 ENCOUNTER — Encounter: Payer: Medicare Other | Admitting: Internal Medicine

## 2018-07-10 ENCOUNTER — Other Ambulatory Visit: Payer: Self-pay | Admitting: Internal Medicine

## 2018-08-06 ENCOUNTER — Ambulatory Visit (INDEPENDENT_AMBULATORY_CARE_PROVIDER_SITE_OTHER): Payer: Medicare Other

## 2018-08-06 ENCOUNTER — Other Ambulatory Visit: Payer: Self-pay

## 2018-08-06 DIAGNOSIS — Z0000 Encounter for general adult medical examination without abnormal findings: Secondary | ICD-10-CM | POA: Diagnosis not present

## 2018-08-06 NOTE — Patient Instructions (Addendum)
  Michele Garza , Thank you for taking time to come for your Medicare Wellness Visit. I appreciate your ongoing commitment to your health goals. Please review the following plan we discussed and let me know if I can assist you in the future.   These are the goals we discussed: Goals      Patient Stated   . Increase physical activity (pt-stated)     Exercise more       This is a list of the screening recommended for you and due dates:  Health Maintenance  Topic Date Due  . Pneumonia vaccines (2 of 2 - PPSV23) 04/29/2018  . Flu Shot  08/18/2018  . Colon Cancer Screening  06/20/2019  . Mammogram  06/22/2019  . Tetanus Vaccine  03/01/2023  . DEXA scan (bone density measurement)  Completed  .  Hepatitis C: One time screening is recommended by Center for Disease Control  (CDC) for  adults born from [birth year] through 1964.   Completed

## 2018-08-06 NOTE — Progress Notes (Addendum)
Subjective:   Michele Garza is a 68 y.o. female who presents for an Initial Medicare Annual Wellness Visit.  Review of Systems    No ROS.  Medicare Wellness Virtual Visit.  Visual/audio telehealth visit, UTA vital signs.   See social history for additional risk factors.    Cardiac Risk Factors include: advanced age (>26men, >62 women);hypertension     Objective:    Today's Vitals   There is no height or weight on file to calculate BMI.  Advanced Directives 08/06/2018 11/29/2016  Does Patient Have a Medical Advance Directive? No No  Does patient want to make changes to medical advance directive? No - Patient declined -    Current Medications (verified) Outpatient Encounter Medications as of 08/06/2018  Medication Sig  . aspirin 81 MG tablet Take 81 mg by mouth daily.    Marland Kitchen atorvastatin (LIPITOR) 20 MG tablet TAKE 1 TABLET BY MOUTH EVERY DAY  . buPROPion (WELLBUTRIN XL) 150 MG 24 hr tablet TAKE 1 TABLET BY MOUTH EVERY DAY  . fish oil-omega-3 fatty acids 1000 MG capsule Take 2 g by mouth daily.  Marland Kitchen losartan (COZAAR) 25 MG tablet Take 2 tablets (50 mg total) by mouth daily.  . metoprolol succinate (TOPROL-XL) 25 MG 24 hr tablet TAKE 1 TABLET BY MOUTH EVERY DAY  . Multiple Vitamin (MULTIVITAMIN) tablet Take 1 tablet by mouth daily.    Marland Kitchen PREMARIN vaginal cream USE 1 TO 2 GRAMS INTRAVAGINALLY TWICE WEEKLY  . acyclovir (ZOVIRAX) 400 MG tablet TAKE 1 TABLET (400 MG TOTAL) BY MOUTH EVERY 4 (FOUR) HOURS WHILE AWAKE. (Patient not taking: Reported on 08/06/2018)  . [DISCONTINUED] calcium citrate-vitamin D (CITRACAL+D) 315-200 MG-UNIT per tablet Take 1 tablet by mouth daily.    . [DISCONTINUED] Cholecalciferol (D3 HIGH POTENCY) 2000 units CAPS Take 1 capsule (2,000 Units total) by mouth daily.  . [DISCONTINUED] diclofenac sodium (VOLTAREN) 1 % GEL Apply 2 g topically 4 (four) times daily.  . [DISCONTINUED] fluticasone (FLONASE) 50 MCG/ACT nasal spray Place 2 sprays into both nostrils daily.  (Patient not taking: Reported on 04/28/2017)   No facility-administered encounter medications on file as of 08/06/2018.     Allergies (verified) Clindamycin/lincomycin   History: Past Medical History:  Diagnosis Date  . Depression   . Diverticulosis of colon    one prior episode July 2011 caused by tomato overingestion  . GERD (gastroesophageal reflux disease)   . History of colonoscopy    done in  dec 2011, repeat due 5 yrs due to [place]  . Hypertension    Past Surgical History:  Procedure Laterality Date  . BREAST SURGERY     sterotactic breaswt biopsy: atypical cells,  continued diagnostic mammograms   Family History  Problem Relation Age of Onset  . Cancer Mother        bilatera, contralateral recurred after 30 yrs  . Stroke Father   . Heart disease Father 32       died of stroke  at 56, tobacco user  . Cancer Father        colon Ca  . Cancer Paternal Grandmother 86       metastatic colon Ca  . Cancer Maternal Uncle        breast x 2 aunts   Social History   Socioeconomic History  . Marital status: Divorced    Spouse name: Not on file  . Number of children: Not on file  . Years of education: Not on file  . Highest education level: Not  on file  Occupational History  . Occupation: Therapist, sports: [employer]    Comment: Dr Juanda Crumble (optometrist in [facility])  Social Needs  . Financial resource strain: Not hard at all  . Food insecurity    Worry: Never true    Inability: Never true  . Transportation needs    Medical: No    Non-medical: No  Tobacco Use  . Smoking status: Never Smoker  . Smokeless tobacco: Never Used  Substance and Sexual Activity  . Alcohol use: Yes    Alcohol/week: 1.0 standard drinks    Types: 1 Shots of liquor per week    Comment: occasional  . Drug use: No  . Sexual activity: Not on file  Lifestyle  . Physical activity    Days per week: 0 days    Minutes per session: Not on file  . Stress: Not at all   Relationships  . Social Herbalist on phone: Not on file    Gets together: Not on file    Attends religious service: Not on file    Active member of club or organization: Not on file    Attends meetings of clubs or organizations: Not on file    Relationship status: Not on file  Other Topics Concern  . Not on file  Social History Narrative  . Not on file    Tobacco Counseling Counseling given: Not Answered   Clinical Intake:  Pre-visit preparation completed: Yes        Diabetes: No  How often do you need to have someone help you when you read instructions, pamphlets, or other written materials from your doctor or pharmacy?: 1 - Never  Interpreter Needed?: No      Activities of Daily Living In your present state of health, do you have any difficulty performing the following activities: 08/06/2018  Hearing? N  Vision? N  Difficulty concentrating or making decisions? N  Walking or climbing stairs? Y  Dressing or bathing? N  Doing errands, shopping? N  Preparing Food and eating ? N  Using the Toilet? N  In the past six months, have you accidently leaked urine? N  Do you have problems with loss of bowel control? N  Managing your Medications? N  Managing your Finances? N  Housekeeping or managing your Housekeeping? N  Some recent data might be hidden     Immunizations and Health Maintenance Immunization History  Administered Date(s) Administered  . Influenza,inj,Quad PF,6+ Mos 12/08/2017  . Pneumococcal Conjugate-13 04/28/2017  . Tdap 02/28/2013   Health Maintenance Due  Topic Date Due  . PNA vac Low Risk Adult (2 of 2 - PPSV23) 04/29/2018    Patient Care Team: Crecencio Mc, MD as PCP - General (Internal Medicine)  Indicate any recent Medical Services you may have received from other than [facility] providers in the past year (date may be approximate).     Assessment:   This is a routine wellness examination for Michele Garza.  I connected with  patient 08/06/18 at 12:00 PM EDT by an audio enabled telemedicine application and verified that I am speaking with the correct person using two identifiers. Patient stated full name and DOB. Patient gave permission to continue with virtual visit. Patient's location was at home and Nurse's location was at [place] office.   Health Screenings  Mammogram - 06/2018 Colonoscopy - 06/2014 Bone Density - 06/2017 Glaucoma -none Hearing -demonstrates normal hearing during visit. Hemoglobin A1C - 11/2017 (  5.5) Cholesterol - 11/2017 (164) Dental- UTD Vision- visits within the last 12 months.  Social  Alcohol intake - yes      Smoking history- never    Smokers in home? none Illicit drug use? none Exercise - no routine Diet - regular Sexually Active -not currently BMI- discussed the importance of a healthy diet, water intake and the benefits of aerobic exercise.  Educational material provided.   Safety  Patient feels safe at home- yes Patient does have smoke detectors at home- yes Patient does wear sunscreen or protective clothing when in direct sunlight -yes Patient does wear seat belt when in a moving vehicle -yes Patient drives- yes Adequate lighting in the home- yes Hallways free of throw rugs, extension cords etc- yes Handrails in use when available- yes  Covid-19 precautions and sickness symptoms discussed.   Activities of Daily Living Patient denies needing assistance with: driving, household chores, feeding themselves, getting from bed to chair, getting to the toilet, bathing/showering, dressing, managing money, or preparing meals.  No new identified risk were noted.    Depression Screen Patient denies losing interest in daily life, feeling hopeless, or crying easily over simple problems.   Medication-taking as directed and without issues.   Fall Screen Patient denies being afraid of falling or falling in the last year.   Memory Screen Patient is alert.  Patient denies  difficulty focusing, concentrating or misplacing items. Correctly identified the president of the [place], season and recall. Patient likes to read and still works 3 days a week which helps with  brain stimulation.  Immunizations The following Immunizations were discussed: Influenza, shingles, pneumonia, and tetanus.   Other Providers Patient Care Team: Crecencio Mc, MD as PCP - General (Internal Medicine)  Hearing/Vision screen  Hearing Screening   125Hz  250Hz  500Hz  1000Hz  2000Hz  3000Hz  4000Hz  6000Hz  8000Hz   Right ear:           Left ear:           Comments: Patient is able to hear conversational tones without difficulty.  No issues reported.  Vision Screening Comments: Wears corrective lenses Visual acuity not assessed, virtual visit.  They have seen their ophthalmologist in the last 12 months.    Dietary issues and exercise activities discussed: Current Exercise Habits: The patient does not participate in regular exercise at present  Goals      Patient Stated   . Increase physical activity (pt-stated)     Exercise more      Depression Screen PHQ 2/9 Scores 08/06/2018 04/11/2016  PHQ - 2 Score 0 0    Fall Risk Fall Risk  08/06/2018 04/28/2017  Falls in the past year? 0 No    Cognitive Function:     6CIT Screen 08/06/2018  What Year? 0 points  What month? 0 points  What time? 0 points  Count back from 20 0 points  Months in reverse 0 points  Repeat phrase 0 points  Total Score 0    Screening Tests Health Maintenance  Topic Date Due  . PNA vac Low Risk Adult (2 of 2 - PPSV23) 04/29/2018  . INFLUENZA VACCINE  08/18/2018  . COLONOSCOPY  06/20/2019  . MAMMOGRAM  06/22/2019  . TETANUS/TDAP  03/01/2023  . DEXA SCAN  Completed  . Hepatitis C Screening  Completed     Plan:   End of life planning; Advanced aging; Advanced directives discussed.  No HCPOA/Living Will.  Additional information declined at this time.  I have personally reviewed and  noted the  following in the patient's chart:   . Medical and social history . Use of alcohol, tobacco or illicit drugs  . Current medications and supplements . Functional ability and status . Nutritional status . Physical activity . Advanced directives . List of other physicians . Hospitalizations, surgeries, and ER visits in previous 12 months . Vitals . Screenings to include cognitive, depression, and falls . Referrals and appointments  In addition, I have reviewed and discussed with patient certain preventive protocols, quality metrics, and best practice recommendations. A written personalized care plan for preventive services as well as general preventive health recommendations were provided to patient.     OBrien-Blaney, Jaison Petraglia L, LPN   8/55/0158     I have reviewed the above information and agree with above.   Deborra Medina, MD

## 2018-08-20 ENCOUNTER — Encounter: Payer: Self-pay | Admitting: Internal Medicine

## 2018-08-20 ENCOUNTER — Other Ambulatory Visit: Payer: Self-pay

## 2018-08-20 ENCOUNTER — Ambulatory Visit (INDEPENDENT_AMBULATORY_CARE_PROVIDER_SITE_OTHER): Payer: Medicare Other | Admitting: Internal Medicine

## 2018-08-20 ENCOUNTER — Telehealth: Payer: Self-pay

## 2018-08-20 DIAGNOSIS — R7301 Impaired fasting glucose: Secondary | ICD-10-CM | POA: Diagnosis not present

## 2018-08-20 DIAGNOSIS — E7800 Pure hypercholesterolemia, unspecified: Secondary | ICD-10-CM

## 2018-08-20 DIAGNOSIS — M25561 Pain in right knee: Secondary | ICD-10-CM

## 2018-08-20 DIAGNOSIS — G8929 Other chronic pain: Secondary | ICD-10-CM

## 2018-08-20 DIAGNOSIS — I10 Essential (primary) hypertension: Secondary | ICD-10-CM

## 2018-08-20 MED ORDER — FLUTICASONE PROPIONATE 50 MCG/ACT NA SUSP: 2.0000 | Freq: Every day | NASAL | 3 refills | Status: DC

## 2018-08-20 NOTE — Telephone Encounter (Signed)
LMTCB. Need to schedule pt for a fasting lab appt.

## 2018-08-20 NOTE — Progress Notes (Signed)
Pt would like to see about getting a new rx for flonase. She stated that it really helps with her allergies.

## 2018-08-20 NOTE — Progress Notes (Signed)
Virtual Visit via [email or web address]  This visit type was conducted due to national recommendations for restrictions regarding the COVID-19 pandemic (e.g. social distancing).  This format is felt to be most appropriate for this patient at this time.  All issues noted in this document were discussed and addressed.  No physical exam was performed (except for noted visual exam findings with Video Visits).   I connected with@ on 08/20/18 at 11:00 AM EDT by a video enabled telemedicine application or telephone and verified that I am speaking with the correct person using two identifiers. Location patient: home Location provider: work or home office Persons participating in the virtual visit: patient, provider  I discussed the limitations, risks, security and privacy concerns of performing an evaluation and management service by telephone and the availability of in person appointments. I also discussed with the patient that there may be a patient responsible charge related to this service. The patient expressed understanding and agreed to proceed.  Reason for visit: follow up  HPI:  68 yr old female presents with persistent right knee pain since Dec 28 when she felt a tearing pain when she twisted and pivoted while walking her dog.  The pain is locatsd on the lateral and posterior side of her knee .  She has had 2 orthopedic evaluations thus far including a steroid injection which did not improve her pain  She has the sensation of something loose in her knee and hte knee feels unstable  No records are available.   2) Patient is taking her medications as prescribed and notes no adverse effects.  Home BP readings have been done about once per week and are  generally < 130/80 .  She is avoiding added salt in her diet and walking regularly about 3 times per week for exercise , but is limited by her knee pain.   3) allergic rhinitis:  She is requesting flonase refill    ROS: See pertinent positives and negatives per  HPI.  Past Medical History:  Diagnosis Date  . Depression   . Diverticulosis of colon    one prior episode July 2011 caused by tomato overingestion  . GERD (gastroesophageal reflux disease)   . History of colonoscopy    done in  dec 2011, repeat due 5 yrs due to [place]  . Hypertension     Past Surgical History:  Procedure Laterality Date  . BREAST SURGERY     sterotactic breaswt biopsy: atypical cells,  continued diagnostic mammograms    Family History  Problem Relation Age of Onset  . Cancer Mother        bilatera, contralateral recurred after 30 yrs  . Stroke Father   . Heart disease Father 73       died of stroke  at 64, tobacco user  . Cancer Father        colon Ca  . Cancer Paternal Grandmother 53       metastatic colon Ca  . Cancer Maternal Uncle        breast x 2 aunts    SOCIAL HX:  reports that she has never smoked. She has never used smokeless tobacco. She reports current alcohol use of about 1.0 standard drinks of alcohol per week. She reports that she does not use drugs.   Current Outpatient Medications:  .  acyclovir (ZOVIRAX) 400 MG tablet, TAKE 1 TABLET (400 MG TOTAL) BY MOUTH EVERY 4 (FOUR) HOURS WHILE AWAKE., Disp: 28 tablet, Rfl: 1 .  aspirin 81 MG  tablet, Take 81 mg by mouth daily.  , Disp: , Rfl:  .  atorvastatin (LIPITOR) 20 MG tablet, TAKE 1 TABLET BY MOUTH EVERY DAY, Disp: 90 tablet, Rfl: 1 .  buPROPion (WELLBUTRIN XL) 150 MG 24 hr tablet, TAKE 1 TABLET BY MOUTH EVERY DAY, Disp: 90 tablet, Rfl: 1 .  fish oil-omega-3 fatty acids 1000 MG capsule, Take 2 g by mouth daily., Disp: , Rfl:  .  fluticasone (FLONASE) 50 MCG/ACT nasal spray, Place 2 sprays into both nostrils daily., Disp: 15.8 mL, Rfl: 3 .  losartan (COZAAR) 25 MG tablet, Take 2 tablets (50 mg total) by mouth daily., Disp: 60 tablet, Rfl: 5 .  metoprolol succinate (TOPROL-XL) 25 MG 24 hr tablet, TAKE 1 TABLET BY MOUTH EVERY DAY, Disp: 90 tablet, Rfl: 1 .  Multiple Vitamin (MULTIVITAMIN) tablet,  Take 1 tablet by mouth daily.  , Disp: , Rfl:  .  PREMARIN vaginal cream, USE 1 TO 2 GRAMS INTRAVAGINALLY TWICE WEEKLY, Disp: 30 g, Rfl: 2  EXAM:  VITALS per patient if applicable:  GENERAL: alert, oriented, appears well and in no acute distress  HEENT: atraumatic, conjunttiva clear, no obvious abnormalities on inspection of external nose and ears  NECK: normal movements of the head and neck  LUNGS: on inspection no signs of respiratory distress, breathing rate appears normal, no obvious gross SOB, gasping or wheezing  CV: no obvious cyanosis  MS: moves all visible extremities without noticeable abnormality  PSYCH/NEURO: pleasant and cooperative, no obvious depression or anxiety, speech and thought processing grossly intact  ASSESSMENT AND PLAN:  Chronic pain of right knee History suggestive of ligament/meniscal tear. no improvement after 7 months .  MRI knee ordereI   Hypercalcemia Resolved with suspension of calcium supplementation .  Impaired fasting glucose A1c remains in the  Normal range   Lab Results  Component Value Date   HGBA1C 5.5 12/08/2017       I discussed the assessment and treatment plan with the patient. The patient was provided an opportunity to ask questions and all were answered. The patient agreed with the plan and demonstrated an understanding of the instructions.   The patient was advised to call back or seek an in-person evaluation if the symptoms worsen or if the condition fails to improve as anticipated.  I provided 25 minutes of non-face-to-face time during this encounter.   Crecencio Mc, MD

## 2018-08-21 DIAGNOSIS — G8929 Other chronic pain: Secondary | ICD-10-CM | POA: Insufficient documentation

## 2018-08-21 NOTE — Assessment & Plan Note (Signed)
Resolved with suspension of calcium supplementation .

## 2018-08-21 NOTE — Assessment & Plan Note (Signed)
A1c remains in the  Normal range   Lab Results  Component Value Date   HGBA1C 5.5 12/08/2017

## 2018-08-21 NOTE — Assessment & Plan Note (Signed)
History suggestive of ligament/meniscal tear. no improvement after 7 months .  MRI knee ordereI

## 2018-08-27 ENCOUNTER — Encounter: Payer: Medicare Other | Admitting: Internal Medicine

## 2018-09-01 ENCOUNTER — Ambulatory Visit
Admission: RE | Admit: 2018-09-01 | Discharge: 2018-09-01 | Disposition: A | Discharge status: Home / Self Care | Payer: Medicare Other | Source: Ambulatory Visit | Attending: Internal Medicine | Admitting: Internal Medicine

## 2018-09-01 ENCOUNTER — Other Ambulatory Visit: Payer: Self-pay

## 2018-09-01 DIAGNOSIS — M25561 Pain in right knee: Secondary | ICD-10-CM | POA: Diagnosis present

## 2018-09-01 DIAGNOSIS — G8929 Other chronic pain: Secondary | ICD-10-CM | POA: Diagnosis present

## 2018-09-05 ENCOUNTER — Other Ambulatory Visit: Payer: Self-pay | Admitting: Internal Medicine

## 2018-09-05 ENCOUNTER — Other Ambulatory Visit: Payer: Self-pay

## 2018-09-05 DIAGNOSIS — G8929 Other chronic pain: Secondary | ICD-10-CM

## 2018-09-05 NOTE — Progress Notes (Signed)
amb ref to orthopedics: :: dr Mack Guise

## 2018-09-07 ENCOUNTER — Encounter: Payer: Self-pay | Admitting: Internal Medicine

## 2018-09-07 ENCOUNTER — Ambulatory Visit (INDEPENDENT_AMBULATORY_CARE_PROVIDER_SITE_OTHER): Payer: Medicare Other | Admitting: Internal Medicine

## 2018-09-07 ENCOUNTER — Other Ambulatory Visit: Payer: Self-pay

## 2018-09-07 VITALS — BP 148/90 | HR 90 | Temp 98.3°F | Resp 16 | Ht 62.0 in | Wt 224.0 lb

## 2018-09-07 DIAGNOSIS — E7800 Pure hypercholesterolemia, unspecified: Secondary | ICD-10-CM

## 2018-09-07 DIAGNOSIS — Z0000 Encounter for general adult medical examination without abnormal findings: Secondary | ICD-10-CM | POA: Diagnosis not present

## 2018-09-07 DIAGNOSIS — G8929 Other chronic pain: Secondary | ICD-10-CM

## 2018-09-07 DIAGNOSIS — Z23 Encounter for immunization: Secondary | ICD-10-CM

## 2018-09-07 DIAGNOSIS — R7301 Impaired fasting glucose: Secondary | ICD-10-CM

## 2018-09-07 DIAGNOSIS — I10 Essential (primary) hypertension: Secondary | ICD-10-CM

## 2018-09-07 DIAGNOSIS — Z6841 Body Mass Index (BMI) 40.0 and over, adult: Secondary | ICD-10-CM

## 2018-09-07 DIAGNOSIS — M25561 Pain in right knee: Secondary | ICD-10-CM

## 2018-09-07 NOTE — Patient Instructions (Addendum)
SOLA bread  At [place] makes Lavash and [employer]     To make a low carb chip :  Take the Joseph[employer] Lavash or Pita bread,  Or the [place] Low carb whole wheat tortilla   Place on metal cookie sheet  Brush with olive oil  Sprinkle garlic powder (NOT garlic salt), grated parmesan cheese, mediterranean seasoning , or all of them?  Bake at 275 for 30 minutes   We have substitutions for your potatoes!!  Try the mashed cauliflower and riced cauliflower dishes instead of rice and mashed potatoes  Mashed turnips are also very low carb!   For desserts :  Try the [employer] greek yogurt dessert flavors and top with reddi Whip .  8 carbs,  80 calories  Try Oikos Triple Zero [place] Yogurt in the salted caramel, and the coffee flavors  With Whipped Cream for dessert  breyer's low carb ice cream, available in bars (on a stick, better ) or scoopable ice cream  HERE ARE THE LOW CARB  BREAD CHOICES

## 2018-09-07 NOTE — Progress Notes (Signed)
Patient ID: Michele Garza, female    DOB: 08/07/1950  Age: 68 y.o. MRN: [id number]  The patient is here for annual preventive  examination and management of other chronic and acute problems.   Mammogram normal June [facility] PAP normal 2018 Colon 2016   The risk factors are reflected in the social history.  The roster of all physicians providing medical care to patient - is listed in the Snapshot section of the chart.  Activities of daily living:  The patient is 100% independent in all ADLs: dressing, toileting, feeding as well as independent mobility  Home safety : The patient has smoke detectors in the home. They wear seatbelts.  There are no firearms at home. There is no violence in the home.   There is no risks for hepatitis, STDs or HIV. There is no   history of blood transfusion. They have no travel history to infectious disease endemic areas of the world.  The patient has seen their dentist in the last six month. They have seen their eye doctor in the last year. They admit to slight hearing difficulty with regard to whispered voices and some television programs.  They have deferred audiologic testing in the last year.  They do not  have excessive sun exposure. Discussed the need for sun protection: hats, long sleeves and use of sunscreen if there is significant sun exposure.   Diet: the importance of a healthy diet is discussed. They do have a healthy diet.  The benefits of regular aerobic exercise were discussed. She walks 4 times per week ,  20 minutes.   Depression screen: there are no signs or vegative symptoms of depression- irritability, change in appetite, anhedonia, sadness/tearfullness.  Cognitive assessment: the patient manages all their financial and personal affairs and is actively engaged. They could relate day,date,year and events; recalled 2/3 objects at 3 minutes; performed clock-face test normally.  The following portions of the patient's history were reviewed and updated  as appropriate: allergies, current medications, past family history, past medical history,  past surgical history, past social history  and problem list.  Visual acuity was not assessed per patient preference since she has regular follow up with her ophthalmologist. Hearing and body mass index were assessed and reviewed.   During the course of the visit the patient was educated and counseled about appropriate screening and preventive services including : fall prevention , diabetes screening, nutrition counseling, colorectal cancer screening, and recommended immunizations.    CC: The primary encounter diagnosis was Hypertension, unspecified type. Diagnoses of Pure hypercholesterolemia, Hypercalcemia, Impaired fasting glucose, Need for immunization against influenza, Morbid obesity with BMI of 40.0-44.9, adult ([place]), Encounter for preventive health examination, and Chronic pain of right knee were also pertinent to this visit.  History Michele Garza has a past medical history of Depression, Diverticulosis of colon, GERD (gastroesophageal reflux disease), History of colonoscopy, and Hypertension.   She has a past surgical history that includes Breast surgery.   Her family history includes Cancer in her father, maternal uncle, and mother; Cancer (age of onset: 25) in her paternal grandmother; Heart disease (age of onset: 67) in her father; Stroke in her father.She reports that she has never smoked. She has never used smokeless tobacco. She reports current alcohol use of about 1.0 standard drinks of alcohol per week. She reports that she does not use drugs.  Outpatient Medications Prior to Visit  Medication Sig Dispense Refill  . acyclovir (ZOVIRAX) 400 MG tablet TAKE 1 TABLET (400 MG TOTAL) BY MOUTH  EVERY 4 (FOUR) HOURS WHILE AWAKE. 28 tablet 1  . aspirin 81 MG tablet Take 81 mg by mouth daily.      Marland Kitchen atorvastatin (LIPITOR) 20 MG tablet TAKE 1 TABLET BY MOUTH EVERY DAY 90 tablet 1  . buPROPion (WELLBUTRIN  XL) 150 MG 24 hr tablet TAKE 1 TABLET BY MOUTH EVERY DAY 90 tablet 1  . fish oil-omega-3 fatty acids 1000 MG capsule Take 2 g by mouth daily.    . fluticasone (FLONASE) 50 MCG/ACT nasal spray Place 2 sprays into both nostrils daily. 15.8 mL 3  . losartan (COZAAR) 25 MG tablet Take 2 tablets (50 mg total) by mouth daily. 60 tablet 5  . metoprolol succinate (TOPROL-XL) 25 MG 24 hr tablet TAKE 1 TABLET BY MOUTH EVERY DAY 90 tablet 1  . Multiple Vitamin (MULTIVITAMIN) tablet Take 1 tablet by mouth daily.      Marland Kitchen PREMARIN vaginal cream USE 1 TO 2 GRAMS INTRAVAGINALLY TWICE WEEKLY 30 g 2   No facility-administered medications prior to visit.     Review of Systems  Patient denies headache, fevers, malaise, unintentional weight loss, skin rash, eye pain, sinus congestion and sinus pain, sore throat, dysphagia,  hemoptysis , cough, dyspnea, wheezing, chest pain, palpitations, orthopnea, edema, abdominal pain, nausea, melena, diarrhea, constipation, flank pain, dysuria, hematuria, urinary  Frequency, nocturia, numbness, tingling, seizures,  Focal weakness, Loss of consciousness,  Tremor, insomnia, depression, anxiety, and suicidal ideation.     Objective:  BP (!) 148/90 (BP Location: Left Arm, Patient Position: Sitting, Cuff Size: Large)   Pulse 90   Temp 98.3 F (36.8 C) (Oral)   Resp 16   Ht _0  (1.575 m)   Wt 224 lb (101.6 kg)   SpO2 96%   BMI 40.97 kg/m   Physical Exam   General appearance: alert, cooperative and appears stated age Head: Normocephalic, without obvious abnormality, atraumatic Eyes: conjunctivae/corneas clear. PERRL, EOM's intact. Fundi benign. Ears: normal TM's and external ear canals both ears Nose: Nares normal. Septum midline. Mucosa normal. No drainage or sinus tenderness. Throat: lips, mucosa, and tongue normal; teeth and gums normal Neck: no adenopathy, no carotid bruit, no JVD, supple, symmetrical, trachea midline and thyroid not enlarged, symmetric, no  tenderness/mass/nodules Lungs: clear to auscultation bilaterally Breasts: normal appearance, no masses or tenderness Heart: regular rate and rhythm, S1, S2 normal, no murmur, click, rub or gallop Abdomen: soft, non-tender; bowel sounds normal; no masses,  no organomegaly Extremities: extremities normal, atraumatic, no cyanosis or edema Pulses: 2+ and symmetric Skin: Skin color, texture, turgor normal. No rashes or lesions Neurologic: Alert and oriented X 3, normal strength and tone. Normal symmetric reflexes. Normal coordination and gait.      Assessment & Plan:   Problem List Items Addressed This Visit      Unprioritized   Hypertension - Primary   Relevant Orders   Comp Met (CMET) (Completed)   Morbid obesity with BMI of 40.0-44.9, adult ([place])    I have addressed  BMI and recommended a low glycemic index diet utilizing smaller more frequent meals to increase metabolism.  I have also recommended that patient start exercising with a goal of 30 minutes of aerobic exercise a minimum of 5 days per week.       Encounter for preventive health examination    age appropriate education and counseling updated, referrals for preventative services and immunizations addressed, dietary and smoking counseling addressed, most recent labs reviewed.  I have personally reviewed and have noted:  1) the patient's medical and social history 2) The pt's use of alcohol, tobacco, and illicit drugs 3) The patient's current medications and supplements 4) Functional ability including ADL's, fall risk, home safety risk, hearing and visual impairment 5) Diet and physical activities 6) Evidence for depression or mood disorder 7) The patient's height, weight, and BMI have been recorded in the chart  I have made referrals, and provided counseling and education based on review of the above       Hyperlipidemia    She is tolerating daily dose of lipitor without side effects.    Lab Results  Component  Value Date   ALT 17 09/07/2018   AST 17 09/07/2018   ALKPHOS 51 04/28/2017   BILITOT 0.7 09/07/2018     Lab Results  Component Value Date   CHOL 190 09/07/2018   HDL 52 09/07/2018   LDLCALC 121 (H) 09/07/2018   LDLDIRECT 108.0 11/11/2016   TRIG 74 09/07/2018   CHOLHDL 3.7 09/07/2018   Lab Results  Component Value Date   ALT 17 09/07/2018   AST 17 09/07/2018   ALKPHOS 51 04/28/2017   BILITOT 0.7 09/07/2018         Relevant Orders   Lipid Profile (Completed)   TSH (Completed)   Hypercalcemia    Very mild but persistent PTH and ionized calcium pending .      Relevant Orders   PTH, Intact and Calcium   Chronic pain of right knee    Secondary to medial meniscal tearing  and degenerative changes resulting in loss of cartilage.  Orthopedic surgical referral In  progress       Other Visit Diagnoses    Impaired fasting glucose       Relevant Orders   Hemoglobin A1c (Completed)   Need for immunization against influenza       Relevant Orders   Flu Vaccine QUAD 36+ mos IM (Completed)      I am having Michele Garza "Michele Garza" maintain her multivitamin, aspirin, fish oil-omega-3 fatty acids, acyclovir, atorvastatin, metoprolol succinate, losartan, buPROPion, Premarin, and fluticasone.  No orders of the defined types were placed in this encounter.   There are no discontinued medications.  Follow-up: No follow-ups on file.   Crecencio Mc, MD

## 2018-09-09 NOTE — Assessment & Plan Note (Signed)
I have addressed  BMI and recommended a low glycemic index diet utilizing smaller more frequent meals to increase metabolism.  I have also recommended that patient start exercising with a goal of 30 minutes of aerobic exercise a minimum of 5 days per week.  

## 2018-09-09 NOTE — Assessment & Plan Note (Signed)

## 2018-09-09 NOTE — Assessment & Plan Note (Signed)
Secondary to medial meniscal tearing  and degenerative changes resulting in loss of cartilage.  Orthopedic surgical referral In  progress

## 2018-09-09 NOTE — Assessment & Plan Note (Signed)
She is tolerating daily dose of lipitor without side effects.    Lab Results  Component Value Date   ALT 17 09/07/2018   AST 17 09/07/2018   ALKPHOS 51 04/28/2017   BILITOT 0.7 09/07/2018     Lab Results  Component Value Date   CHOL 190 09/07/2018   HDL 52 09/07/2018   LDLCALC 121 (H) 09/07/2018   LDLDIRECT 108.0 11/11/2016   TRIG 74 09/07/2018   CHOLHDL 3.7 09/07/2018   Lab Results  Component Value Date   ALT 17 09/07/2018   AST 17 09/07/2018   ALKPHOS 51 04/28/2017   BILITOT 0.7 09/07/2018

## 2018-09-09 NOTE — Assessment & Plan Note (Addendum)
Very mild but persistent PTH and ionized calcium pending .

## 2018-09-10 LAB — COMPREHENSIVE METABOLIC PANEL
AG Ratio: 1.8 (calc) (ref 1.0–2.5)
ALT: 17 U/L (ref 6–29)
AST: 17 U/L (ref 10–35)
Albumin: 4.6 g/dL (ref 3.6–5.1)
Alkaline phosphatase (APISO): 49 U/L (ref 37–153)
BUN: 16 mg/dL (ref 7–25)
CO2: 23 mmol/L (ref 20–32)
Calcium: 10.5 mg/dL — ABNORMAL HIGH (ref 8.6–10.4)
Chloride: 99 mmol/L (ref 98–110)
Creat: 0.91 mg/dL (ref 0.50–0.99)
Globulin: 2.6 g/dL (calc) (ref 1.9–3.7)
Glucose, Bld: 79 mg/dL (ref 65–99)
Potassium: 3.8 mmol/L (ref 3.5–5.3)
Sodium: 139 mmol/L (ref 135–146)
Total Bilirubin: 0.7 mg/dL (ref 0.2–1.2)
Total Protein: 7.2 g/dL (ref 6.1–8.1)

## 2018-09-10 LAB — HEMOGLOBIN A1C
Hgb A1c MFr Bld: 5.6 % of total Hgb (ref <5.7)
Mean Plasma Glucose: 114 (calc)
eAG (mmol/L): 6.3 (calc)

## 2018-09-10 LAB — LIPID PANEL
Cholesterol: 190 mg/dL (ref <200)
HDL: 52 mg/dL (ref >=50)
LDL Cholesterol (Calc): 121 mg/dL (calc) — ABNORMAL HIGH
Non-HDL Cholesterol (Calc): 138 mg/dL (calc) — ABNORMAL HIGH (ref <130)
Total CHOL/HDL Ratio: 3.7 (calc) (ref <5.0)
Triglycerides: 74 mg/dL (ref <150)

## 2018-09-10 LAB — SPECIMEN COMPROMISED

## 2018-09-10 LAB — PTH, INTACT AND CALCIUM
Calcium: 10.5 mg/dL — ABNORMAL HIGH (ref 8.6–10.4)
PTH: 18 pg/mL (ref 14–64)

## 2018-09-10 LAB — TSH: TSH: 0.64 mIU/L (ref 0.40–4.50)

## 2018-10-05 ENCOUNTER — Other Ambulatory Visit: Payer: Self-pay | Admitting: Internal Medicine

## 2018-10-07 ENCOUNTER — Other Ambulatory Visit: Payer: Self-pay | Admitting: Internal Medicine

## 2018-10-14 ENCOUNTER — Other Ambulatory Visit: Payer: Self-pay | Admitting: Internal Medicine

## 2018-11-05 DIAGNOSIS — M1990 Unspecified osteoarthritis, unspecified site: Secondary | ICD-10-CM | POA: Insufficient documentation

## 2018-11-16 ENCOUNTER — Other Ambulatory Visit: Payer: Self-pay | Admitting: Internal Medicine

## 2018-12-05 ENCOUNTER — Other Ambulatory Visit: Payer: Self-pay | Admitting: Internal Medicine

## 2019-01-28 DIAGNOSIS — M1711 Unilateral primary osteoarthritis, right knee: Secondary | ICD-10-CM | POA: Diagnosis not present

## 2019-01-28 DIAGNOSIS — M25561 Pain in right knee: Secondary | ICD-10-CM | POA: Diagnosis not present

## 2019-04-05 DIAGNOSIS — M1712 Unilateral primary osteoarthritis, left knee: Secondary | ICD-10-CM | POA: Diagnosis not present

## 2019-04-09 DIAGNOSIS — H25013 Cortical age-related cataract, bilateral: Secondary | ICD-10-CM | POA: Diagnosis not present

## 2019-04-10 ENCOUNTER — Other Ambulatory Visit: Payer: Self-pay | Admitting: Internal Medicine

## 2019-04-15 ENCOUNTER — Other Ambulatory Visit: Payer: Self-pay

## 2019-04-15 ENCOUNTER — Ambulatory Visit: Payer: Medicare Other | Admitting: Internal Medicine

## 2019-04-17 ENCOUNTER — Ambulatory Visit: Payer: Medicare PPO | Admitting: Internal Medicine

## 2019-04-17 ENCOUNTER — Encounter: Payer: Self-pay | Admitting: Internal Medicine

## 2019-04-17 ENCOUNTER — Other Ambulatory Visit: Payer: Self-pay

## 2019-04-17 VITALS — BP 148/82 | HR 89 | Temp 98.0°F | Resp 16 | Ht 62.0 in | Wt 232.4 lb

## 2019-04-17 DIAGNOSIS — Z6841 Body Mass Index (BMI) 40.0 and over, adult: Secondary | ICD-10-CM | POA: Diagnosis not present

## 2019-04-17 DIAGNOSIS — I10 Essential (primary) hypertension: Secondary | ICD-10-CM

## 2019-04-17 DIAGNOSIS — F324 Major depressive disorder, single episode, in partial remission: Secondary | ICD-10-CM | POA: Diagnosis not present

## 2019-04-17 DIAGNOSIS — R7301 Impaired fasting glucose: Secondary | ICD-10-CM | POA: Diagnosis not present

## 2019-04-17 DIAGNOSIS — L603 Nail dystrophy: Secondary | ICD-10-CM | POA: Diagnosis not present

## 2019-04-17 LAB — LIPID PANEL
Cholesterol: 155 mg/dL (ref 0–200)
HDL: 54.1 mg/dL (ref >39.00)
LDL Cholesterol: 91 mg/dL (ref 0–99)
NonHDL: 100.71
Total CHOL/HDL Ratio: 3
Triglycerides: 47 mg/dL (ref 0.0–149.0)
VLDL: 9.4 mg/dL (ref 0.0–40.0)

## 2019-04-17 LAB — COMPREHENSIVE METABOLIC PANEL
ALT: 18 U/L (ref 0–35)
AST: 19 U/L (ref 0–37)
Albumin: 4.4 g/dL (ref 3.5–5.2)
Alkaline Phosphatase: 50 U/L (ref 39–117)
BUN: 23 mg/dL (ref 6–23)
CO2: 30 mEq/L (ref 19–32)
Calcium: 10.4 mg/dL (ref 8.4–10.5)
Chloride: 104 mEq/L (ref 96–112)
Creatinine, Ser: 0.95 mg/dL (ref 0.40–1.20)
GFR: 58.57 mL/min — ABNORMAL LOW (ref >60.00)
Glucose, Bld: 105 mg/dL — ABNORMAL HIGH (ref 70–99)
Potassium: 3.9 mEq/L (ref 3.5–5.1)
Sodium: 139 mEq/L (ref 135–145)
Total Bilirubin: 0.7 mg/dL (ref 0.2–1.2)
Total Protein: 7.1 g/dL (ref 6.0–8.3)

## 2019-04-17 LAB — HEMOGLOBIN A1C: Hgb A1c MFr Bld: 5.7 % (ref 4.6–6.5)

## 2019-04-17 NOTE — Progress Notes (Signed)
Subjective:  Patient ID: Jalan Hilder, female    DOB: 11/03/1950  Age: 69 y.o. MRN: [id number]  CC: The primary encounter diagnosis was Impaired fasting glucose. Diagnoses of Essential hypertension, Major depressive disorder with single episode, in partial remission ([place]), Morbid obesity with BMI of 40.0-44.9, adult ([place]), and Dystrophic nail were also pertinent to this visit.  HPI Batsheva Beyah presents for follow up on hyperlipidemia, hypertension and depression   This visit occurred during the SARS-CoV-2 public health emergency.  Safety protocols were in place, including screening questions prior to the visit, additional usage of staff PPE, and extensive cleaning of exam room while observing appropriate contact time as indicated for disinfecting solutions.   Cc:  Bilateral knee pain saw Mack Guise in August  MRI done  PT ordered for 4 months which ended in January,  PT helped minimally on the right and not at all on the left.  Repeat evaluation March ,  Injection (one only!) not approved by new insurance so no plan yet.  Taking meloxicam.  No tylenol . Right leg swells (meniscal tears on MRI) ,  Pain is aggravated by PT and walking   Patient is taking her medications as prescribed and notes no adverse effects.  Home BP readings have not been done  .  She is avoiding added salt in her diet and walking regularly about 3 times per week for exercise  .  Morbid obesity;  No weight change.  Follows a low carb diet Mon-Fri,  Indulges on weekends.  Not able to exercise due to bilateral knee pain  Depression:  Symptoms resolved on wellbutrin.  Does not want to change medications   Outpatient Medications Prior to Visit  Medication Sig Dispense Refill  . acyclovir (ZOVIRAX) 400 MG tablet TAKE 1 TABLET (400 MG TOTAL) BY MOUTH EVERY 4 (FOUR) HOURS WHILE AWAKE. 28 tablet 1  . aspirin 81 MG tablet Take 81 mg by mouth daily.      Marland Kitchen atorvastatin (LIPITOR) 20 MG tablet TAKE 1 TABLET BY MOUTH EVERY DAY 90  tablet 1  . buPROPion (WELLBUTRIN XL) 150 MG 24 hr tablet TAKE 1 TABLET BY MOUTH EVERY DAY 90 tablet 1  . diclofenac Sodium (VOLTAREN) 1 % GEL diclofenac 1 % topical gel    . fish oil-omega-3 fatty acids 1000 MG capsule Take 2 g by mouth daily.    . fluticasone (FLONASE) 50 MCG/ACT nasal spray SPRAY 2 SPRAYS INTO EACH NOSTRIL EVERY DAY 48 mL 1  . losartan (COZAAR) 50 MG tablet TAKE 1 TABLET BY MOUTH EVERY DAY 90 tablet 1  . meloxicam (MOBIC) 15 MG tablet     . metoprolol succinate (TOPROL-XL) 25 MG 24 hr tablet TAKE 1 TABLET BY MOUTH EVERY DAY 90 tablet 1  . Multiple Vitamin (MULTIVITAMIN) tablet Take 1 tablet by mouth daily.      Marland Kitchen PREMARIN vaginal cream USE 1 TO 2 GRAMS INTRAVAGINALLY TWICE WEEKLY 30 g 2   No facility-administered medications prior to visit.    Review of Systems;  Patient denies headache, fevers, malaise, unintentional weight loss, skin rash, eye pain, sinus congestion and sinus pain, sore throat, dysphagia,  hemoptysis , cough, dyspnea, wheezing, chest pain, palpitations, orthopnea, edema, abdominal pain, nausea, melena, diarrhea, constipation, flank pain, dysuria, hematuria, urinary  Frequency, nocturia, numbness, tingling, seizures,  Focal weakness, Loss of consciousness,  Tremor, insomnia, depression, anxiety, and suicidal ideation.      Objective:  BP (!) 148/82 (BP Location: Left Arm, Patient Position: Sitting, Cuff Size:  Large)   Pulse 89   Temp 98 F (36.7 C) (Temporal)   Resp 16   Ht 5\' 2"  (1.575 m)   Wt 232 lb 6.4 oz (105.4 kg)   SpO2 96%   BMI 42.51 kg/m   BP Readings from Last 3 Encounters:  04/17/19 (!) 148/82  09/07/18 (!) 148/90  12/08/17 (!) 142/90    Wt Readings from Last 3 Encounters:  04/17/19 232 lb 6.4 oz (105.4 kg)  09/07/18 224 lb (101.6 kg)  12/08/17 223 lb 6.4 oz (101.3 kg)    General appearance: alert, cooperative and appears stated age Ears: normal TM's and external ear canals both ears Throat: lips, mucosa, and tongue  normal; teeth and gums normal Neck: no adenopathy, no carotid bruit, supple, symmetrical, trachea midline and thyroid not enlarged, symmetric, no tenderness/mass/nodules Back: symmetric, no curvature. ROM normal. No CVA tenderness. Lungs: clear to auscultation bilaterally Heart: regular rate and rhythm, S1, S2 normal, no murmur, click, rub or gallop Abdomen: soft, non-tender; bowel sounds normal; no masses,  no organomegaly Pulses: 2+ and symmetric Skin: Skin color, texture, turgor normal. No rashes or lesions Lymph nodes: Cervical, supraclavicular, and axillary nodes normal.  Lab Results  Component Value Date   HGBA1C 5.6 09/07/2018   HGBA1C 5.5 12/08/2017   HGBA1C 5.7 04/28/2017    Lab Results  Component Value Date   CREATININE 0.91 09/07/2018   CREATININE 0.95 12/22/2017   CREATININE 1.01 (H) 12/08/2017    Lab Results  Component Value Date   WBC 7.9 04/03/2015   HGB 14.1 04/03/2015   HCT 41.1 04/03/2015   PLT 217 04/03/2015   GLUCOSE 79 09/07/2018   CHOL 190 09/07/2018   TRIG 74 09/07/2018   HDL 52 09/07/2018   LDLDIRECT 108.0 11/11/2016   LDLCALC 121 (H) 09/07/2018   ALT 17 09/07/2018   AST 17 09/07/2018   NA 139 09/07/2018   K 3.8 09/07/2018   CL 99 09/07/2018   CREATININE 0.91 09/07/2018   BUN 16 09/07/2018   CO2 23 09/07/2018   TSH 0.64 09/07/2018   HGBA1C 5.6 09/07/2018   MICROALBUR <0.2 12/08/2017    MR Knee Right Wo Contrast  Result Date: 09/02/2018 CLINICAL DATA:  Continued posterolateral knee pain after fall 6 months ago. EXAM: MRI OF THE RIGHT KNEE WITHOUT CONTRAST TECHNIQUE: Multiplanar, multisequence MR imaging of the knee was performed. No intravenous contrast was administered. COMPARISON:  None. FINDINGS: MENISCI Medial meniscus: Complex degenerative tearing and maceration of the body and posterior horn. Lateral meniscus: Small radial tears of the body and posterior horn. LIGAMENTS Cruciates:  Intact ACL and PCL. Collaterals: Medial collateral  ligament is intact. Lateral collateral ligament complex is intact. CARTILAGE Patellofemoral:  Mild partial-thickness cartilage loss. Medial: Large areas of full-thickness cartilage loss over the medial femoral condyle and medial tibial plateau. Subchondral marrow edema and cystic change in the peripheral medial tibial plateau. Lateral: Large areas of full-thickness cartilage loss in the medial aspects of the lateral femoral condyle and lateral tibial plateau with subchondral marrow edema. Joint: Trace joint effusion. Normal Hoffa's fat. No plical thickening. Popliteal Fossa:  No Baker cyst. Intact popliteus tendon. Extensor Mechanism: Intact quadriceps tendon and patellar tendon. Intact medial and lateral patellar retinaculum. Intact MPFL. Bones: No acute fracture or dislocation. Mild lateral subluxation of the tibia with respect to the distal femur. No suspicious bone lesion. Other: None. IMPRESSION: 1. Complex degenerative tearing and maceration of the medial meniscus body and posterior horn. 2. Small radial tears of the lateral  meniscus body and posterior horn. 3. 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 Electronically Signed   By: Titus Dubin M.D.   On: 09/02/2018 11:19    Assessment & Plan:   Problem List Items Addressed This Visit      Unprioritized   Hypertension    she reports compliance with medication regimen  but has an elevated reading today in office.  She is not using NSAIDs daily.  Discussed goal of 120/70  (130/80 for patients over 70)  to preserve renal function.  She has been asked to check her  BP  at home and  submit readings for evaluation. Renal function, electrolytes and screen for proteinuria are all pending       Major depressive disorder in partial remission [facility])    She prefers to remain on wellbutrin for now.       Morbid obesity with BMI of 40.0-44.9, adult ([place])    I have addressed  BMI and recommended wt loss of  10% of body weight over the next 6 months using a calorie restricted (1200 cal ) glycemic index diet and regular exercise a minimum of 5 days per week.        Dystrophic nail    Etiology unclear.  Recommend Hair skin and nails daily supplement for 3 months,  If no improvement,  Refer to dermatology       Other Visit Diagnoses    Impaired fasting glucose    -  Primary   Relevant Orders   Comprehensive metabolic panel   Hemoglobin A1c   Lipid panel     I provided  30 minutes of  face-to-face time during this encounter reviewing patient's current problems and past surgeries, labs and imaging studies, providing counseling on the above mentioned problems , and coordination  of care .  I am having Cora Collum "Willistine Crider" maintain her multivitamin, aspirin, fish oil-omega-3 fatty acids, acyclovir, Premarin, metoprolol succinate, atorvastatin, fluticasone, buPROPion, losartan, meloxicam, and diclofenac Sodium.  No orders of the defined types were placed in this encounter.   There are no discontinued medications.  Follow-up: No follow-ups on file.   Crecencio Mc, MD

## 2019-04-17 NOTE — Patient Instructions (Addendum)
Continue meloxicam    You can add up to 2000 mg of acetominophen (tylenol) every day safely  In divided doses (500 mg every 6 hours  Or 1000 mg every 12 hours.)  Look into aquatic exercise   My Fitness Pal app can help you lose weight    The new goals for optimal blood pressure management are 120/70 to 130/80   Please check your blood pressure a few times at home and send me the readings so I can determine if you need a change in medication    Take one of your blood pressure medications at night   Hair Skin and Nails supplement for crumbling nails Keep them covered so you stop picking at them!

## 2019-04-17 NOTE — Assessment & Plan Note (Signed)
Etiology unclear.  Recommend Hair skin and nails daily supplement for 3 months,  If no improvement,  Refer to dermatology

## 2019-04-17 NOTE — Assessment & Plan Note (Signed)
she reports compliance with medication regimen  but has an elevated reading today in office.  She is not using NSAIDs daily.  Discussed goal of 120/70  (130/80 for patients over 70)  to preserve renal function.  She has been asked to check her  BP  at home and  submit readings for evaluation. Renal function, electrolytes and screen for proteinuria are all pending

## 2019-04-17 NOTE — Assessment & Plan Note (Signed)
She prefers to remain on wellbutrin for now.

## 2019-04-17 NOTE — Assessment & Plan Note (Signed)
I have addressed  BMI and recommended wt loss of 10% of body weight over the next 6 months using a calorie restricted (1200 cal ) glycemic index diet and regular exercise a minimum of 5 days per week.

## 2019-04-20 ENCOUNTER — Other Ambulatory Visit: Payer: Self-pay | Admitting: Internal Medicine

## 2019-05-20 DIAGNOSIS — M1712 Unilateral primary osteoarthritis, left knee: Secondary | ICD-10-CM | POA: Diagnosis not present

## 2019-05-30 ENCOUNTER — Other Ambulatory Visit: Payer: Self-pay | Admitting: Internal Medicine

## 2019-06-24 LAB — HM MAMMOGRAPHY

## 2019-07-05 DIAGNOSIS — M7661 Achilles tendinitis, right leg: Secondary | ICD-10-CM | POA: Diagnosis not present

## 2019-07-05 DIAGNOSIS — M170 Bilateral primary osteoarthritis of knee: Secondary | ICD-10-CM | POA: Diagnosis not present

## 2019-07-08 DIAGNOSIS — M7661 Achilles tendinitis, right leg: Secondary | ICD-10-CM | POA: Insufficient documentation

## 2019-07-08 DIAGNOSIS — M170 Bilateral primary osteoarthritis of knee: Secondary | ICD-10-CM | POA: Insufficient documentation

## 2019-08-07 ENCOUNTER — Ambulatory Visit (INDEPENDENT_AMBULATORY_CARE_PROVIDER_SITE_OTHER): Payer: Medicare PPO

## 2019-08-07 VITALS — Ht 62.0 in | Wt 232.0 lb

## 2019-08-07 DIAGNOSIS — Z0000 Encounter for general adult medical examination without abnormal findings: Secondary | ICD-10-CM

## 2019-08-07 NOTE — Patient Instructions (Addendum)
Michele Garza , Thank you for taking time to come for your Medicare Wellness Visit. I appreciate your ongoing commitment to your health goals. Please review the following plan we discussed and let me know if I can assist you in the future.   These are the goals we discussed: Goals      Patient Stated   .  Increase physical activity (pt-stated)      Exercise more at the gym 3 days weekly Weight loss Strengthening exercises       This is a list of the screening recommended for you and due dates:  Health Maintenance  Topic Date Due  . Pneumonia vaccines (2 of 2 - PPSV23) 04/29/2018  . Colon Cancer Screening  06/20/2019  . Flu Shot  08/18/2019  . Mammogram  06/23/2020  . Tetanus Vaccine  03/01/2023  . DEXA scan (bone density measurement)  Completed  . COVID-19 Vaccine  Completed  .  Hepatitis C: One time screening is recommended by Center for Disease Control  (CDC) for  adults born from [birth year] through 1964.   Completed    Immunizations Immunization History  Administered Date(s) Administered  . Influenza,inj,Quad PF,6+ Mos 12/08/2017, 09/07/2018  . PFIZER SARS-COV-2 Vaccination 02/08/2019, 03/01/2019  . Pneumococcal Conjugate-13 04/28/2017  . Tdap 02/28/2013   Advanced directives: declined  Conditions/risks identified: none ew  Follow up in one year for your annual wellness visit   Preventive Care 65 Years and Older, Female Preventive care refers to lifestyle choices and visits with your health care provider that can promote health and wellness. What does preventive care include?  A yearly physical exam. This is also called an annual well check.  Dental exams once or twice a year.  Routine eye exams. Ask your health care provider how often you should have your eyes checked.  Personal lifestyle choices, including:  Daily care of your teeth and gums.  Regular physical activity.  Eating a healthy diet.  Avoiding tobacco and drug use.  Limiting alcohol  use.  Practicing safe sex.  Taking low-dose aspirin every day.  Taking vitamin and mineral supplements as recommended by your health care provider. What happens during an annual well check? The services and screenings done by your health care provider during your annual well check will depend on your age, overall health, lifestyle risk factors, and family history of disease. Counseling  Your health care provider may ask you questions about your:  Alcohol use.  Tobacco use.  Drug use.  Emotional well-being.  Home and relationship well-being.  Sexual activity.  Eating habits.  History of falls.  Memory and ability to understand (cognition).  Work and work Statistician.  Reproductive health. Screening  You may have the following tests or measurements:  Height, weight, and BMI.  Blood pressure.  Lipid and cholesterol levels. These may be checked every 5 years, or more frequently if you are over 50 years old.  Skin check.  Lung cancer screening. You may have this screening every year starting at age 33 if you have a 30-pack-year history of smoking and currently smoke or have quit within the past 15 years.  Fecal occult blood test (FOBT) of the stool. You may have this test every year starting at age 58.  Flexible sigmoidoscopy or colonoscopy. You may have a sigmoidoscopy every 5 years or a colonoscopy every 10 years starting at age 47.  Hepatitis C blood test.  Hepatitis B blood test.  Sexually transmitted disease (STD) testing.  Diabetes screening. This is  done by checking your blood sugar (glucose) after you have not eaten for a while (fasting). You may have this done every 1-3 years.  Bone density scan. This is done to screen for osteoporosis. You may have this done starting at age 17.  Mammogram. This may be done every 1-2 years. Talk to your health care provider about how often you should have regular mammograms. Talk with your health care provider about  your test results, treatment options, and if necessary, the need for more tests. Vaccines  Your health care provider may recommend certain vaccines, such as:  Influenza vaccine. This is recommended every year.  Tetanus, diphtheria, and acellular pertussis (Tdap, Td) vaccine. You may need a Td booster every 10 years.  Zoster vaccine. You may need this after age 19.  Pneumococcal 13-valent conjugate (PCV13) vaccine. One dose is recommended after age 8.  Pneumococcal polysaccharide (PPSV23) vaccine. One dose is recommended after age 68. Talk to your health care provider about which screenings and vaccines you need and how often you need them. This information is not intended to replace advice given to you by your health care provider. Make sure you discuss any questions you have with your health care provider. Document Released: 01/30/2015 Document Revised: 09/23/2015 Document Reviewed: 11/04/2014 Elsevier Interactive Patient Education  2017 [employer] Prevention in the Home Falls can cause injuries. They can happen to people of all ages. There are many things you can do to make your home safe and to help prevent falls. What can I do on the outside of my home?  Regularly fix the edges of walkways and driveways and fix any cracks.  Remove anything that might make you trip as you walk through a door, such as a raised step or threshold.  Trim any bushes or trees on the path to your home.  Use bright outdoor lighting.  Clear any walking paths of anything that might make someone trip, such as rocks or tools.  Regularly check to see if handrails are loose or broken. Make sure that both sides of any steps have handrails.  Any raised decks and porches should have guardrails on the edges.  Have any leaves, snow, or ice cleared regularly.  Use sand or salt on walking paths during winter.  Clean up any spills in your garage right away. This includes oil or grease spills. What  can I do in the bathroom?  Use night lights.  Install grab bars by the toilet and in the tub and shower. Do not use towel bars as grab bars.  Use non-skid mats or decals in the tub or shower.  If you need to sit down in the shower, use a plastic, non-slip stool.  Keep the floor dry. Clean up any water that spills on the floor as soon as it happens.  Remove soap buildup in the tub or shower regularly.  Attach bath mats securely with double-sided non-slip rug tape.  Do not have throw rugs and other things on the floor that can make you trip. What can I do in the bedroom?  Use night lights.  Make sure that you have a light by your bed that is easy to reach.  Do not use any sheets or blankets that are too big for your bed. They should not hang down onto the floor.  Have a firm chair that has side arms. You can use this for support while you get dressed.  Do not have throw rugs and other things  on the floor that can make you trip. What can I do in the kitchen?  Clean up any spills right away.  Avoid walking on wet floors.  Keep items that you use a lot in easy-to-reach places.  If you need to reach something above you, use a strong step stool that has a grab bar.  Keep electrical cords out of the way.  Do not use floor polish or wax that makes floors slippery. If you must use wax, use non-skid floor wax.  Do not have throw rugs and other things on the floor that can make you trip. What can I do with my stairs?  Do not leave any items on the stairs.  Make sure that there are handrails on both sides of the stairs and use them. Fix handrails that are broken or loose. Make sure that handrails are as long as the stairways.  Check any carpeting to make sure that it is firmly attached to the stairs. Fix any carpet that is loose or worn.  Avoid having throw rugs at the top or bottom of the stairs. If you do have throw rugs, attach them to the floor with carpet tape.  Make sure  that you have a light switch at the top of the stairs and the bottom of the stairs. If you do not have them, ask someone to add them for you. What else can I do to help prevent falls?  Wear shoes that:  Do not have high heels.  Have rubber bottoms.  Are comfortable and fit you well.  Are closed at the toe. Do not wear sandals.  If you use a stepladder:  Make sure that it is fully opened. Do not climb a closed stepladder.  Make sure that both sides of the stepladder are locked into place.  Ask someone to hold it for you, if possible.  Clearly mark and make sure that you can see:  Any grab bars or handrails.  First and last steps.  Where the edge of each step is.  Use tools that help you move around (mobility aids) if they are needed. These include:  Canes.  Walkers.  Scooters.  Crutches.  Turn on the lights when you go into a dark area. Replace any light bulbs as soon as they burn out.  Set up your furniture so you have a clear path. Avoid moving your furniture around.  If any of your floors are uneven, fix them.  If there are any pets around you, be aware of where they are.  Review your medicines with your doctor. Some medicines can make you feel dizzy. This can increase your chance of falling. Ask your doctor what other things that you can do to help prevent falls. This information is not intended to replace advice given to you by your health care provider. Make sure you discuss any questions you have with your health care provider. Document Released: 10/30/2008 Document Revised: 06/11/2015 Document Reviewed: 02/07/2014 Elsevier Interactive Patient Education  2017 [employer].

## 2019-08-07 NOTE — Progress Notes (Addendum)
Subjective:   Michele Garza is a 69 y.o. female who presents for Medicare Annual (Subsequent) preventive examination.  Review of Systems    Cardiac Risk Factors include: advanced age (>58men, >75 women);hypertensionNo ROS.  Medicare Wellness Virtual Visit.      Objective:    Today's Vitals   08/07/19 1201  Weight: 232 lb (105.2 kg)  Height: 5\' 2"  (1.575 m)   Body mass index is 42.43 kg/m.  Advanced Directives 08/07/2019 08/06/2018 11/29/2016  Does Patient Have a Medical Advance Directive? No No No  Does patient want to make changes to medical advance directive? - No - Patient declined -  Would patient like information on creating a medical advance directive? No - Patient declined - -    Current Medications (verified) Outpatient Encounter Medications as of 08/07/2019  Medication Sig   acyclovir (ZOVIRAX) 400 MG tablet TAKE 1 TABLET (400 MG TOTAL) BY MOUTH EVERY 4 (FOUR) HOURS WHILE AWAKE.   aspirin 81 MG tablet Take 81 mg by mouth daily.     atorvastatin (LIPITOR) 20 MG tablet TAKE 1 TABLET BY MOUTH EVERY DAY   buPROPion (WELLBUTRIN XL) 150 MG 24 hr tablet TAKE 1 TABLET BY MOUTH EVERY DAY   diclofenac Sodium (VOLTAREN) 1 % GEL diclofenac 1 % topical gel   fish oil-omega-3 fatty acids 1000 MG capsule Take 2 g by mouth daily.   fluticasone (FLONASE) 50 MCG/ACT nasal spray SPRAY 2 SPRAYS INTO EACH NOSTRIL EVERY DAY   losartan (COZAAR) 50 MG tablet TAKE 1 TABLET BY MOUTH EVERY DAY   meloxicam (MOBIC) 15 MG tablet    metoprolol succinate (TOPROL-XL) 25 MG 24 hr tablet TAKE 1 TABLET BY MOUTH EVERY DAY   Multiple Vitamin (MULTIVITAMIN) tablet Take 1 tablet by mouth daily.     PREMARIN vaginal cream USE 1 TO 2 GRAMS INTRAVAGINALLY TWICE WEEKLY   No facility-administered encounter medications on file as of 08/07/2019.    Allergies (verified) Clindamycin/lincomycin   History: Past Medical History:  Diagnosis Date   Depression    Diverticulosis of colon    one prior episode  July 2011 caused by tomato overingestion   GERD (gastroesophageal reflux disease)    History of colonoscopy    done in  dec 2011, repeat due 5 yrs due to [place]   Hypertension    Past Surgical History:  Procedure Laterality Date   BREAST SURGERY     sterotactic breaswt biopsy: atypical cells,  continued diagnostic mammograms   Family History  Problem Relation Age of Onset   Cancer Mother        bilatera, contralateral recurred after 70 yrs   Stroke Father    Heart disease Father 71       died of stroke  at 60, tobacco user   Cancer Father        colon Ca   Cancer Paternal Grandmother 63       metastatic colon Ca   Cancer Maternal Uncle        breast x 2 aunts   Social History   Socioeconomic History   Marital status: Divorced    Spouse name: Not on file   Number of children: Not on file   Years of education: Not on file   Highest education level: Not on file  Occupational History   Occupation: Glass blower/designer    Employer: 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[place])  Tobacco Use   Smoking status: Never Smoker  Smokeless tobacco: Never Used  Vaping Use   Vaping Use: Never used  Substance and Sexual Activity   Alcohol use: Yes    Alcohol/week: 1.0 standard drink    Types: 1 Shots of liquor per week    Comment: occasional   Drug use: No   Sexual activity: Not on file  Other Topics Concern   Not on file  Social History Narrative   Not on file   Social Determinants of Health   Financial Resource Strain: Low Risk    Difficulty of Paying Living Expenses: Not hard at all  Food Insecurity:    Worried About Charity fundraiser in the Last Year:    Arboriculturist in the Last Year:   Transportation Needs: No Transportation Needs   Lack of Transportation (Medical): No   Lack of Transportation (Non-Medical): No  Physical Activity:    Days of Exercise per Week:    Minutes of Exercise per Session:   Stress: No Stress Concern Present   Feeling of  Stress : Not at all  Social Connections: Unknown   Frequency of Communication with Friends and Family: Not on file   Frequency of Social Gatherings with Friends and Family: Not on file   Attends Religious Services: More than 4 times per year   Active Member of [employer] or Organizations: Yes   Attends Archivist Meetings: Not on file   Marital Status: Not on file    Tobacco Counseling Counseling given: Not Answered   Clinical Intake:  Pre-visit preparation completed: Yes        Diabetes: No  How often do you need to have someone help you when you read instructions, pamphlets, or other written materials from your doctor or pharmacy?: 1 - Never  Interpreter Needed?: No      Activities of Daily Living In your present state of health, do you have any difficulty performing the following activities: 08/07/2019  Hearing? N  Vision? N  Difficulty concentrating or making decisions? N  Walking or climbing stairs? Y  Comment Chronic knee pain, bilateral  Dressing or bathing? N  Doing errands, shopping? N  Preparing Food and eating ? N  Using the Toilet? N  In the past six months, have you accidently leaked urine? N  Do you have problems with loss of bowel control? N  Managing your Medications? N  Managing your Finances? N  Housekeeping or managing your Housekeeping? N  Some recent data might be hidden    Patient Care Team: Crecencio Mc, MD as PCP - General (Internal Medicine)  Indicate any recent Medical Services you may have received from other than [facility] providers in the past year (date may be approximate).     Assessment:   This is a routine wellness examination for Michele Garza.  I connected with Michele Garza today by telephone and verified that I am speaking with the correct person using two identifiers. Location patient: home Location provider: work Persons participating in the virtual visit: patient, Marine scientist.    I discussed the limitations, risks, security and  privacy concerns of performing an evaluation and management service by telephone and the availability of in person appointments. The patient expressed understanding and verbally consented to this telephonic visit.    Interactive audio and video telecommunications were attempted between this provider and patient, however failed, due to patient having technical difficulties OR patient did not have access to video capability.  We continued and completed visit with audio only.  Some  vital signs may be absent or patient reported.   Hearing/Vision screen  Hearing Screening   125Hz  250Hz  500Hz  1000Hz  2000Hz  3000Hz  4000Hz  6000Hz  8000Hz   Right ear:           Left ear:           Comments: Patient is able to hear conversational tones without difficulty.  No issues reported.  Vision Screening Comments: Followed by [facility] Wears corrective lenses Visual acuity not assessed, virtual visit.  They have seen their ophthalmologist in the last 12 months.    Dietary issues and exercise activities discussed: Current Exercise Habits: The patient does not participate in regular exercise at present  Healthy diet Good water intake Caffeine- 2 cups daily  Goals       Patient Stated     Increase physical activity (pt-stated)      Exercise more at the gym 3 days weekly Weight loss Strengthening exercises       Depression Screen PHQ 2/9 Scores 08/07/2019 08/20/2018 08/06/2018 04/11/2016  PHQ - 2 Score 0 0 0 0  PHQ- 9 Score - 2 - -    Fall Risk Fall Risk  08/07/2019 04/17/2019 08/06/2018 04/28/2017  Falls in the past year? 0 1 0 No  Number falls in past yr: 0 0 - -  Injury with Fall? 0 0 - -  Follow up Falls evaluation completed Falls evaluation completed - -   Handrails in use when climbing stairs? Yes  Home free of loose throw rugs in walkways, pet beds, electrical cords, etc? Yes  Adequate lighting in your home to reduce risk of falls? Yes   ASSISTIVE DEVICES UTILIZED TO PREVENT  FALLS:  Life alert? No  Use of a cane, walker or w/c? No  Grab bars in the bathroom? No  Shower chair or bench in shower? No  Elevated toilet seat or a handicapped toilet? No   TIMED UP AND GO:  Was the test performed? No . Virtual visit.  Cognitive Function: Patient is alert and oriented x3.  Works at an ophthalmology office 3 days weekly. MMSE - Mini Mental State Exam 08/07/2019  Not completed: Unable to complete     6CIT Screen 08/06/2018  What Year? 0 points  What month? 0 points  What time? 0 points  Count back from 20 0 points  Months in reverse 0 points  Repeat phrase 0 points  Total Score 0    Immunizations Immunization History  Administered Date(s) Administered   Influenza,inj,Quad PF,6+ Mos 12/08/2017, 09/07/2018   PFIZER SARS-COV-2 Vaccination 02/08/2019, 03/01/2019   Pneumococcal Conjugate-13 04/28/2017   Tdap 02/28/2013    Health Maintenance Health Maintenance  Topic Date Due   PNA vac Low Risk Adult (2 of 2 - PPSV23) 04/29/2018   COLONOSCOPY  06/20/2019   INFLUENZA VACCINE  08/18/2019   MAMMOGRAM  06/23/2020   TETANUS/TDAP  03/01/2023   DEXA SCAN  Completed   COVID-19 Vaccine  Completed   Hepatitis C Screening  Completed   Dental Screening: Recommended annual dental exams for proper oral hygiene. Visits every 6 months.   Community Resource Referral / Chronic Care Management: CRR required this visit?  No   CCM required this visit?  No      Plan:   Keep all routine maintenance appointments.   I have personally reviewed and noted the following in the patient's chart:   Medical and social history Use of alcohol, tobacco or illicit drugs  Current medications and supplements Functional ability and  status Nutritional status Physical activity Advanced directives List of other physicians Hospitalizations, surgeries, and ER visits in previous 12 months Vitals Screenings to include cognitive, depression, and falls Referrals and  appointments  In addition, I have reviewed and discussed with patient certain preventive protocols, quality metrics, and best practice recommendations. A written personalized care plan for preventive services as well as general preventive health recommendations were provided to patient via mychart.     OBrien-Blaney, [place] Prout L, LPN   8/93/[id number]    I have reviewed the above information and agree with above.   Deborra Medina, MD

## 2019-08-22 ENCOUNTER — Other Ambulatory Visit: Payer: Self-pay | Admitting: Internal Medicine

## 2019-09-10 DIAGNOSIS — S92424A Nondisplaced fracture of distal phalanx of right great toe, initial encounter for closed fracture: Secondary | ICD-10-CM | POA: Diagnosis not present

## 2019-09-10 DIAGNOSIS — M7661 Achilles tendinitis, right leg: Secondary | ICD-10-CM | POA: Diagnosis not present

## 2019-09-30 DIAGNOSIS — M7661 Achilles tendinitis, right leg: Secondary | ICD-10-CM | POA: Diagnosis not present

## 2019-10-04 ENCOUNTER — Encounter: Payer: Medicare PPO | Admitting: Internal Medicine

## 2019-10-10 ENCOUNTER — Other Ambulatory Visit: Payer: Self-pay | Admitting: Internal Medicine

## 2019-10-14 DIAGNOSIS — S92424A Nondisplaced fracture of distal phalanx of right great toe, initial encounter for closed fracture: Secondary | ICD-10-CM | POA: Diagnosis not present

## 2019-10-17 ENCOUNTER — Other Ambulatory Visit: Payer: Self-pay | Admitting: Internal Medicine

## 2019-10-17 ENCOUNTER — Encounter: Payer: Self-pay | Admitting: Internal Medicine

## 2019-10-17 ENCOUNTER — Encounter: Payer: Medicare PPO | Admitting: Internal Medicine

## 2019-10-17 ENCOUNTER — Ambulatory Visit (INDEPENDENT_AMBULATORY_CARE_PROVIDER_SITE_OTHER): Payer: Medicare PPO | Admitting: Internal Medicine

## 2019-10-17 ENCOUNTER — Other Ambulatory Visit: Payer: Self-pay

## 2019-10-17 VITALS — BP 122/80 | HR 85 | Temp 98.3°F | Resp 16 | Ht 62.0 in | Wt 221.0 lb

## 2019-10-17 DIAGNOSIS — R7303 Prediabetes: Secondary | ICD-10-CM | POA: Diagnosis not present

## 2019-10-17 DIAGNOSIS — Z0000 Encounter for general adult medical examination without abnormal findings: Secondary | ICD-10-CM | POA: Diagnosis not present

## 2019-10-17 DIAGNOSIS — Z6841 Body Mass Index (BMI) 40.0 and over, adult: Secondary | ICD-10-CM | POA: Diagnosis not present

## 2019-10-17 DIAGNOSIS — Z1211 Encounter for screening for malignant neoplasm of colon: Secondary | ICD-10-CM

## 2019-10-17 DIAGNOSIS — R634 Abnormal weight loss: Secondary | ICD-10-CM | POA: Diagnosis not present

## 2019-10-17 DIAGNOSIS — F324 Major depressive disorder, single episode, in partial remission: Secondary | ICD-10-CM

## 2019-10-17 DIAGNOSIS — I10 Essential (primary) hypertension: Secondary | ICD-10-CM

## 2019-10-17 DIAGNOSIS — R944 Abnormal results of kidney function studies: Secondary | ICD-10-CM | POA: Diagnosis not present

## 2019-10-17 DIAGNOSIS — E7800 Pure hypercholesterolemia, unspecified: Secondary | ICD-10-CM | POA: Diagnosis not present

## 2019-10-17 LAB — LIPID PANEL
Cholesterol: 170 mg/dL (ref 0–200)
HDL: 57.9 mg/dL (ref >39.00)
LDL Cholesterol: 99 mg/dL (ref 0–99)
NonHDL: 112.18
Total CHOL/HDL Ratio: 3
Triglycerides: 67 mg/dL (ref 0.0–149.0)
VLDL: 13.4 mg/dL (ref 0.0–40.0)

## 2019-10-17 LAB — TSH: TSH: 0.55 u[IU]/mL (ref 0.35–4.50)

## 2019-10-17 LAB — HEMOGLOBIN A1C: Hgb A1c MFr Bld: 5.7 % (ref 4.6–6.5)

## 2019-10-17 LAB — COMPREHENSIVE METABOLIC PANEL
ALT: 14 U/L (ref 0–35)
AST: 17 U/L (ref 0–37)
Albumin: 4.5 g/dL (ref 3.5–5.2)
Alkaline Phosphatase: 54 U/L (ref 39–117)
BUN: 18 mg/dL (ref 6–23)
CO2: 30 mEq/L (ref 19–32)
Calcium: 10.3 mg/dL (ref 8.4–10.5)
Chloride: 101 mEq/L (ref 96–112)
Creatinine, Ser: 1.06 mg/dL (ref 0.40–1.20)
GFR: 51.54 mL/min — ABNORMAL LOW (ref >60.00)
Glucose, Bld: 105 mg/dL — ABNORMAL HIGH (ref 70–99)
Potassium: 3.7 mEq/L (ref 3.5–5.1)
Sodium: 141 mEq/L (ref 135–145)
Total Bilirubin: 0.8 mg/dL (ref 0.2–1.2)
Total Protein: 7.1 g/dL (ref 6.0–8.3)

## 2019-10-17 NOTE — Progress Notes (Signed)
Patient ID: Michele Garza, female    DOB: 04/22/1950  Age: 69 y.o. MRN: [id number]  The patient is here for annual PREVENTIVE  examination and management of other chronic and acute problems.   The risk factors are reflected in the social history.  The roster of all physicians providing medical care to patient - is listed in the Snapshot section of the chart.  Activities of daily living:  The patient is 100% independent in all ADLs: dressing, toileting, feeding as well as independent mobility  Home safety : The patient has smoke detectors in the home. They wear seatbelts.  There are no firearms at home. There is no violence in the home.   There is no risks for hepatitis, STDs or HIV. There is no   history of blood transfusion. They have no travel history to infectious disease endemic areas of the world.  The patient has seen their dentist in the last six month. They have seen their eye doctor in the last year. They admit to slight hearing difficulty with regard to whispered voices and some television programs.  They have deferred audiologic testing in the last year.  They do not  have excessive sun exposure. Discussed the need for sun protection: hats, long sleeves and use of sunscreen if there is significant sun exposure.   Diet: the importance of a healthy diet is discussed. They do have a healthy diet.  The benefits of regular aerobic exercise were discussed. She walks 4 times per week ,  20 minutes.   Depression screen: there are no signs or vegative symptoms of depression- irritability, change in appetite, anhedonia, sadness/tearfullness.  Cognitive assessment: the patient manages all their financial and personal affairs and is actively engaged. They could relate day,date,year and events; recalled 2/3 objects at 3 minutes; performed clock-face test normally.  The following portions of the patient's history were reviewed and updated as appropriate: allergies, current medications, past  family history, past medical history,  past surgical history, past social history  and problem list.  Visual acuity was not assessed per patient preference since she has regular follow up with her ophthalmologist. Hearing and body mass index were assessed and reviewed.   During the course of the visit the patient was educated and counseled about appropriate screening and preventive services including : fall prevention , diabetes screening, nutrition counseling, colorectal cancer screening, and recommended immunizations.    CC: The primary encounter diagnosis was Colon cancer screening. Diagnoses of Pure hypercholesterolemia, Hypercalcemia, Prediabetes, Weight loss, Decreased calculated GFR, Primary hypertension, Morbid obesity with BMI of 40.0-44.9, adult ([place]), and Encounter for preventive health examination were also pertinent to this visit.  Feeling good except bothered by Heel spurs and achilles pain new onset.   Seeing podiatry   She has had a weight loss of 11 lbs, intentional, by reducing the carbohydrates in her diet.   She is looking forward to retiring in  December and focusing  on her health.    History Vincy has a past medical history of Depression, Diverticulosis of colon, GERD (gastroesophageal reflux disease), History of colonoscopy, and Hypertension.   She has a past surgical history that includes Breast surgery.   Her family history includes Cancer in her father, maternal uncle, and mother; Cancer (age of onset: 19) in her paternal grandmother; Heart disease (age of onset: 16) in her father; Stroke in her father.She reports that she has never smoked. She has never used smokeless tobacco. She reports current alcohol use of about 1.0 standard  drink of alcohol per week. She reports that she does not use drugs.  Outpatient Medications Prior to Visit  Medication Sig Dispense Refill  . acyclovir (ZOVIRAX) 400 MG tablet TAKE 1 TABLET (400 MG TOTAL) BY MOUTH EVERY 4 (FOUR) HOURS  WHILE AWAKE. 28 tablet 1  . aspirin 81 MG tablet Take 81 mg by mouth daily.      Marland Kitchen atorvastatin (LIPITOR) 20 MG tablet TAKE 1 TABLET BY MOUTH EVERY DAY 90 tablet 1  . buPROPion (WELLBUTRIN XL) 150 MG 24 hr tablet TAKE 1 TABLET BY MOUTH EVERY DAY 90 tablet 1  . diclofenac Sodium (VOLTAREN) 1 % GEL diclofenac 1 % topical gel    . fish oil-omega-3 fatty acids 1000 MG capsule Take 2 g by mouth daily.    Marland Kitchen losartan (COZAAR) 50 MG tablet TAKE 1 TABLET BY MOUTH EVERY DAY 90 tablet 1  . meloxicam (MOBIC) 15 MG tablet     . Multiple Vitamin (MULTIVITAMIN) tablet Take 1 tablet by mouth daily.      Marland Kitchen PREMARIN vaginal cream USE 1 TO 2 GRAMS INTRAVAGINALLY TWICE WEEKLY 30 g 2  . metoprolol succinate (TOPROL-XL) 25 MG 24 hr tablet TAKE 1 TABLET BY MOUTH EVERY DAY 90 tablet 1  . fluticasone (FLONASE) 50 MCG/ACT nasal spray SPRAY 2 SPRAYS INTO EACH NOSTRIL EVERY DAY 48 mL 1   No facility-administered medications prior to visit.    Review of Systems   Patient denies headache, fevers, malaise, unintentional weight loss, skin rash, eye pain, sinus congestion and sinus pain, sore throat, dysphagia,  hemoptysis , cough, dyspnea, wheezing, chest pain, palpitations, orthopnea, edema, abdominal pain, nausea, melena, diarrhea, constipation, flank pain, dysuria, hematuria, urinary  Frequency, nocturia, numbness, tingling, seizures,  Focal weakness, Loss of consciousness,  Tremor, insomnia, depression, anxiety, and suicidal ideation.     Objective:  BP 122/80 (BP Location: Left Arm, Patient Position: Sitting, Cuff Size: Large)   Pulse 85   Temp 98.3 F (36.8 C) (Oral)   Resp 16   Ht 5\' 2"  (1.575 m)   Wt 221 lb (100.2 kg)   SpO2 96%   BMI 40.42 kg/m   Physical Exam  General appearance: alert, cooperative and appears stated age Head: Normocephalic, without obvious abnormality, atraumatic Eyes: conjunctivae/corneas clear. PERRL, EOM's intact. Fundi benign. Ears: normal TM's and external ear canals both  ears Nose: Nares normal. Septum midline. Mucosa normal. No drainage or sinus tenderness. Throat: lips, mucosa, and tongue normal; teeth and gums normal Neck: no adenopathy, no carotid bruit, no JVD, supple, symmetrical, trachea midline and thyroid not enlarged, symmetric, no tenderness/mass/nodules Lungs: clear to auscultation bilaterally Breasts: normal appearance, no masses or tenderness Heart: regular rate and rhythm, S1, S2 normal, no murmur, click, rub or gallop Abdomen: soft, non-tender; bowel sounds normal; no masses,  no organomegaly Extremities: extremities normal, atraumatic, no cyanosis or edema Pulses: 2+ and symmetric Skin: Skin color, texture, turgor normal. No rashes or lesions Neurologic: Alert and oriented X 3, normal strength and tone. Normal symmetric reflexes. Normal coordination and gait.    Assessment & Plan:   Problem List Items Addressed This Visit      Unprioritized   Decreased calculated GFR    Advised to suspend NSAID and return in 2 weeks for repeat check.       Encounter for preventive health examination    age appropriate education and counseling updated, referrals for preventative services and immunizations addressed, dietary and smoking counseling addressed, most recent labs reviewed.  I have  personally reviewed and have noted:  1) the patient's medical and social history 2) The pt's use of alcohol, tobacco, and illicit drugs 3) The patient's current medications and supplements 4) Functional ability including ADL's, fall risk, home safety risk, hearing and visual impairment 5) Diet and physical activities 6) Evidence for depression or mood disorder 7) The patient's height, weight, and BMI have been recorded in the chart  I have made referrals, and provided counseling and education based on review of the above      RESOLVED: Hypercalcemia   Relevant Orders   Comprehensive metabolic panel (Completed)   Hyperlipidemia    She is tolerating daily  dose of lipitor 20 mg without side effects.   LDL is at goal.  Lab Results  Component Value Date   CHOL 170 10/17/2019   HDL 57.90 10/17/2019   LDLCALC 99 10/17/2019   LDLDIRECT 108.0 11/11/2016   TRIG 67.0 10/17/2019   CHOLHDL 3 10/17/2019   Lab Results  Component Value Date   ALT 14 10/17/2019   AST 17 10/17/2019   ALKPHOS 54 10/17/2019   BILITOT 0.8 10/17/2019          Relevant Orders   Lipid panel (Completed)   Hypertension    Improved control on current regimen of losartan 50 mg and metoprolol xl 25 mg daily.  Addressing change in gfr   Lab Results  Component Value Date   CREATININE 1.06 10/17/2019   Lab Results  Component Value Date   NA 141 10/17/2019   K 3.7 10/17/2019   CL 101 10/17/2019   CO2 30 10/17/2019         Morbid obesity with BMI of 40.0-44.9, adult ([place])    I have congratulated her in her weight loss of 11 lbs and encouraged  Continued weight loss with goal of 10% of body weight over the next 6 months using a low glycemic index diet and regular exercise a minimum of 5 days per week.         Other Visit Diagnoses    Colon cancer screening    -  Primary   Relevant Orders   Ambulatory referral to Gastroenterology   Prediabetes       Relevant Orders   Hemoglobin A1c (Completed)   Weight loss       Relevant Orders   TSH (Completed)      I have discontinued Franchesca Roseman's fluticasone. I am also having her maintain her multivitamin, aspirin, fish oil-omega-3 fatty acids, acyclovir, Premarin, meloxicam, diclofenac Sodium, buPROPion, atorvastatin, and losartan.  No orders of the defined types were placed in this encounter.   Medications Discontinued During This Encounter  Medication Reason  . fluticasone (FLONASE) 50 MCG/ACT nasal spray     Follow-up: Return in about 6 months (around 04/15/2020).   Crecencio Mc, MD

## 2019-10-17 NOTE — Patient Instructions (Signed)
Congratulations on the weight loss!   A few other options to try when you get bored:   Healthy Choice low carb power bowl entrees and FITKITCHEN cauliflower pizza bowls are great low carb entrees that microwave in 5 minutes     Health Maintenance for Postmenopausal Women Menopause is a normal process in which your ability to get pregnant comes to an end. This process happens slowly over many months or years, usually between the ages of 47 and 16. Menopause is complete when you have missed your menstrual periods for 12 months. It is important to talk with your health care provider about some of the most common conditions that affect women after menopause (postmenopausal women). These include heart disease, cancer, and bone loss (osteoporosis). Adopting a healthy lifestyle and getting preventive care can help to promote your health and wellness. The actions you take can also lower your chances of developing some of these common conditions. What should I know about menopause? During menopause, you may get a number of symptoms, such as:  Hot flashes. These can be moderate or severe.  Night sweats.  Decrease in sex drive.  Mood swings.  Headaches.  Tiredness.  Irritability.  Memory problems.  Insomnia. Choosing to treat or not to treat these symptoms is a decision that you make with your health care provider. Do I need hormone replacement therapy?  Hormone replacement therapy is effective in treating symptoms that are caused by menopause, such as hot flashes and night sweats.  Hormone replacement carries certain risks, especially as you become older. If you are thinking about using estrogen or estrogen with progestin, discuss the benefits and risks with your health care provider. What is my risk for heart disease and stroke? The risk of heart disease, heart attack, and stroke increases as you age. One of the causes may be a change in the body's hormones during menopause. This can  affect how your body uses dietary fats, triglycerides, and cholesterol. Heart attack and stroke are medical emergencies. There are many things that you can do to help prevent heart disease and stroke. Watch your blood pressure  High blood pressure causes heart disease and increases the risk of stroke. This is more likely to develop in people who have high blood pressure readings, are of African descent, or are overweight.  Have your blood pressure checked: ? Every 3-5 years if you are 78-6 years of age. ? Every year if you are 100 years old or older. Eat a healthy diet   Eat a diet that includes plenty of vegetables, fruits, low-fat dairy products, and lean protein.  Do not eat a lot of foods that are high in solid fats, added sugars, or sodium. Get regular exercise Get regular exercise. This is one of the most important things you can do for your health. Most adults should:  Try to exercise for at least 150 minutes each week. The exercise should increase your heart rate and make you sweat (moderate-intensity exercise).  Try to do strengthening exercises at least twice each week. Do these in addition to the moderate-intensity exercise.  Spend less time sitting. Even light physical activity can be beneficial. Other tips  Work with your health care provider to achieve or maintain a healthy weight.  Do not use any products that contain nicotine or tobacco, such as cigarettes, e-cigarettes, and chewing tobacco. If you need help quitting, ask your health care provider.  Know your numbers. Ask your health care provider to check your cholesterol and  your blood sugar (glucose). Continue to have your blood tested as directed by your health care provider. Do I need screening for cancer? Depending on your health history and family history, you may need to have cancer screening at different stages of your life. This may include screening for:  Breast cancer.  Cervical cancer.  Lung  cancer.  Colorectal cancer. What is my risk for osteoporosis? After menopause, you may be at increased risk for osteoporosis. Osteoporosis is a condition in which bone destruction happens more quickly than new bone creation. To help prevent osteoporosis or the bone fractures that can happen because of osteoporosis, you may take the following actions:  If you are 23-9 years old, get at least 1,000 mg of calcium and at least 600 mg of vitamin D per day.  If you are older than age 40 but younger than age 20, get at least 1,200 mg of calcium and at least 600 mg of vitamin D per day.  If you are older than age 33, get at least 1,200 mg of calcium and at least 800 mg of vitamin D per day. Smoking and drinking excessive alcohol increase the risk of osteoporosis. Eat foods that are rich in calcium and vitamin D, and do weight-bearing exercises several times each week as directed by your health care provider. How does menopause affect my mental health? Depression may occur at any age, but it is more common as you become older. Common symptoms of depression include:  Low or sad mood.  Changes in sleep patterns.  Changes in appetite or eating patterns.  Feeling an overall lack of motivation or enjoyment of activities that you previously enjoyed.  Frequent crying spells. Talk with your health care provider if you think that you are experiencing depression. General instructions See your health care provider for regular wellness exams and vaccines. This may include:  Scheduling regular health, dental, and eye exams.  Getting and maintaining your vaccines. These include: ? Influenza vaccine. Get this vaccine each year before the flu season begins. ? Pneumonia vaccine. ? Shingles vaccine. ? Tetanus, diphtheria, and pertussis (Tdap) booster vaccine. Your health care provider may also recommend other immunizations. Tell your health care provider if you have ever been abused or do not feel safe at  home. Summary  Menopause is a normal process in which your ability to get pregnant comes to an end.  This condition causes hot flashes, night sweats, decreased interest in sex, mood swings, headaches, or lack of sleep.  Treatment for this condition may include hormone replacement therapy.  Take actions to keep yourself healthy, including exercising regularly, eating a healthy diet, watching your weight, and checking your blood pressure and blood sugar levels.  Get screened for cancer and depression. Make sure that you are up to date with all your vaccines. This information is not intended to replace advice given to you by your health care provider. Make sure you discuss any questions you have with your health care provider. Document Revised: 12/27/2017 Document Reviewed: 12/27/2017 Elsevier Patient Education  2020 [employer].

## 2019-10-19 DIAGNOSIS — R944 Abnormal results of kidney function studies: Secondary | ICD-10-CM | POA: Insufficient documentation

## 2019-10-19 NOTE — Assessment & Plan Note (Signed)
I have congratulated her in her weight loss of 11 lbs and encouraged  Continued weight loss with goal of 10% of body weight over the next 6 months using a low glycemic index diet and regular exercise a minimum of 5 days per week.

## 2019-10-19 NOTE — Assessment & Plan Note (Signed)
She is tolerating daily dose of lipitor 20 mg without side effects.   LDL is at goal.  Lab Results  Component Value Date   CHOL 170 10/17/2019   HDL 57.90 10/17/2019   LDLCALC 99 10/17/2019   LDLDIRECT 108.0 11/11/2016   TRIG 67.0 10/17/2019   CHOLHDL 3 10/17/2019   Lab Results  Component Value Date   ALT 14 10/17/2019   AST 17 10/17/2019   ALKPHOS 54 10/17/2019   BILITOT 0.8 10/17/2019

## 2019-10-19 NOTE — Assessment & Plan Note (Signed)
Improved control on current regimen of losartan 50 mg and metoprolol xl 25 mg daily.  Addressing change in gfr   Lab Results  Component Value Date   CREATININE 1.06 10/17/2019   Lab Results  Component Value Date   NA 141 10/17/2019   K 3.7 10/17/2019   CL 101 10/17/2019   CO2 30 10/17/2019

## 2019-10-19 NOTE — Assessment & Plan Note (Signed)

## 2019-10-19 NOTE — Progress Notes (Signed)
Your cholesterol and a1c are unchanged and fine,  but your Your kidney function is off a little more than last time. Since you were fasting ,  It may be from mild dehydration.  However, If you have been taking any nonsteroidals (ibuprofen, Aleve,  Advil, Motrin, or meloxicam)  Please suspend them for 2 weeks,   increase your hydration,  And  return this for a nonfasting BMET to make sure the kidney function  has returned to normal. You willl need to make a lab appt.   Regards,  Dr. Derrel Nip

## 2019-10-19 NOTE — Assessment & Plan Note (Signed)
Advised to suspend NSAID and return in 2 weeks for repeat check.

## 2019-10-19 NOTE — Assessment & Plan Note (Signed)
Mood and affect are optimistic and stable.  Continue wellbutrin

## 2019-10-22 ENCOUNTER — Other Ambulatory Visit: Payer: Self-pay | Admitting: Internal Medicine

## 2019-10-22 ENCOUNTER — Telehealth: Payer: Self-pay

## 2019-10-22 DIAGNOSIS — Z8619 Personal history of other infectious and parasitic diseases: Secondary | ICD-10-CM

## 2019-10-22 NOTE — Telephone Encounter (Signed)
LMTCB in regards to lab results.  

## 2019-10-22 NOTE — Telephone Encounter (Signed)
Pt returned your call.  

## 2019-10-23 NOTE — Telephone Encounter (Signed)
See result note message 

## 2019-10-31 ENCOUNTER — Telehealth: Payer: Self-pay | Admitting: *Deleted

## 2019-10-31 DIAGNOSIS — R944 Abnormal results of kidney function studies: Secondary | ICD-10-CM

## 2019-10-31 NOTE — Telephone Encounter (Signed)
Please place future orders for lab appt.  

## 2019-11-04 ENCOUNTER — Other Ambulatory Visit: Payer: Self-pay

## 2019-11-04 ENCOUNTER — Other Ambulatory Visit (INDEPENDENT_AMBULATORY_CARE_PROVIDER_SITE_OTHER): Payer: Medicare PPO

## 2019-11-04 DIAGNOSIS — R944 Abnormal results of kidney function studies: Secondary | ICD-10-CM | POA: Diagnosis not present

## 2019-11-04 DIAGNOSIS — Z23 Encounter for immunization: Secondary | ICD-10-CM

## 2019-11-04 LAB — RENAL FUNCTION PANEL
Albumin: 4 g/dL (ref 3.5–5.2)
BUN: 13 mg/dL (ref 6–23)
CO2: 28 mEq/L (ref 19–32)
Calcium: 9.3 mg/dL (ref 8.4–10.5)
Chloride: 105 mEq/L (ref 96–112)
Creatinine, Ser: 0.9 mg/dL (ref 0.40–1.20)
GFR: 65.63 mL/min (ref >60.00)
Glucose, Bld: 102 mg/dL — ABNORMAL HIGH (ref 70–99)
Phosphorus: 2.2 mg/dL — ABNORMAL LOW (ref 2.3–4.6)
Potassium: 3.7 mEq/L (ref 3.5–5.1)
Sodium: 140 mEq/L (ref 135–145)

## 2019-11-05 NOTE — Progress Notes (Signed)
Good news.  Your kidney function and potassium are normal .  You can continue your current medications as long as you are tolerating them and follow up with me in 6 months .   Regards,  Dr. Derrel Nip

## 2019-11-07 ENCOUNTER — Other Ambulatory Visit: Payer: Self-pay

## 2019-11-07 ENCOUNTER — Telehealth (INDEPENDENT_AMBULATORY_CARE_PROVIDER_SITE_OTHER): Payer: Medicare PPO | Admitting: Internal Medicine

## 2019-11-07 ENCOUNTER — Encounter: Payer: Self-pay | Admitting: Internal Medicine

## 2019-11-07 VITALS — BP 120/80 | Ht 62.0 in | Wt 215.0 lb

## 2019-11-07 DIAGNOSIS — R059 Cough, unspecified: Secondary | ICD-10-CM | POA: Diagnosis not present

## 2019-11-07 DIAGNOSIS — R0982 Postnasal drip: Secondary | ICD-10-CM

## 2019-11-07 DIAGNOSIS — J321 Chronic frontal sinusitis: Secondary | ICD-10-CM

## 2019-11-07 MED ORDER — BENZONATATE 200 MG PO CAPS: 200.0000 mg | ORAL_CAPSULE | Freq: Three times a day (TID) | ORAL | 0 refills | Status: DC | PRN

## 2019-11-07 MED ORDER — IPRATROPIUM BROMIDE 0.06 % NA SOLN: 2.0000 | Freq: Three times a day (TID) | NASAL | 0 refills | Status: DC

## 2019-11-07 MED ORDER — SALINE SPRAY 0.65 % NA SOLN: 1.0000 | NASAL | 2 refills | Status: DC | PRN

## 2019-11-07 MED ORDER — AZITHROMYCIN 250 MG PO TABS: ORAL_TABLET | ORAL | 0 refills | Status: DC

## 2019-11-07 NOTE — Progress Notes (Signed)
Patient states she thinks she has a sinus infection that's draining down her throat. Ongoing for a month, worse at night. Cough is caused by drainage.   Benzonatate 200 mg has helped the Patient at night.

## 2019-11-07 NOTE — Progress Notes (Signed)
Virtual Visit via Video Note  I connected with Michele Garza  on 11/07/19 at  1:10 PM EDT by a video enabled telemedicine application and verified that I am speaking with the correct person using two identifiers.  Location patient: work [employer] or home office Persons participating in the virtual visit: patient, provider  I discussed the limitations of evaluation and management by telemedicine and the availability of in person appointments. The patient expressed understanding and agreed to proceed.   HPI: Recurrent frontal sinusitis, chronic but intermittent like Q4 years tried allegra, sduafed, old tessalon, yellow green mucous coughing and sx's worse at night and tessalon is helping she had flu shot Monday and covid booster utd and no covid 19 sxs   ROS: See pertinent positives and negatives per HPI.  Past Medical History:  Diagnosis Date  . Depression   . Diverticulosis of colon    one prior episode July 2011 caused by tomato overingestion  . GERD (gastroesophageal reflux disease)   . History of colonoscopy    done in  dec 2011, repeat due 5 yrs due to [place]  . Hypertension     Past Surgical History:  Procedure Laterality Date  . BREAST SURGERY     sterotactic breaswt biopsy: atypical cells,  continued diagnostic mammograms     Current Outpatient Medications:  .  acyclovir (ZOVIRAX) 400 MG tablet, TAKE 1 TABLET (400 MG TOTAL) BY MOUTH EVERY 4 (FOUR) HOURS WHILE AWAKE., Disp: 28 tablet, Rfl: 1 .  aspirin 81 MG tablet, Take 81 mg by mouth daily.  , Disp: , Rfl:  .  atorvastatin (LIPITOR) 20 MG tablet, TAKE 1 TABLET BY MOUTH EVERY DAY, Disp: 90 tablet, Rfl: 1 .  buPROPion (WELLBUTRIN XL) 150 MG 24 hr tablet, TAKE 1 TABLET BY MOUTH EVERY DAY, Disp: 90 tablet, Rfl: 1 .  diclofenac Sodium (VOLTAREN) 1 % GEL, diclofenac 1 % topical gel, Disp: , Rfl:  .  fish oil-omega-3 fatty acids 1000 MG capsule, Take 2 g by mouth daily., Disp: , Rfl:  .  losartan (COZAAR) 50 MG tablet, TAKE  1 TABLET BY MOUTH EVERY DAY, Disp: 90 tablet, Rfl: 1 .  metoprolol succinate (TOPROL-XL) 25 MG 24 hr tablet, TAKE 1 TABLET BY MOUTH EVERY DAY, Disp: 90 tablet, Rfl: 1 .  Multiple Vitamin (MULTIVITAMIN) tablet, Take 1 tablet by mouth daily.  , Disp: , Rfl:  .  azithromycin (ZITHROMAX) 250 MG tablet, 2 pills day 1 and 1 pill day 2-5 with food, Disp: 6 tablet, Rfl: 0 .  benzonatate (TESSALON) 200 MG capsule, Take 1 capsule (200 mg total) by mouth 3 (three) times daily as needed for cough., Disp: 30 capsule, Rfl: 0 .  ipratropium (ATROVENT) 0.06 % nasal spray, Place 2 sprays into both nostrils 3 (three) times daily., Disp: 15 mL, Rfl: 0 .  meloxicam (MOBIC) 15 MG tablet, , Disp: , Rfl:  .  PREMARIN vaginal cream, USE 1 TO 2 GRAMS INTRAVAGINALLY TWICE WEEKLY (Patient not taking: Reported on 11/07/2019), Disp: 30 g, Rfl: 2 .  sodium chloride (OCEAN) 0.65 % SOLN nasal spray, Place 1 spray into both nostrils as needed for congestion., Disp: 30 mL, Rfl: 2  EXAM:  VITALS per patient if applicable:  GENERAL: alert, oriented, appears well and in no acute distress  HEENT: atraumatic, conjunttiva clear, no obvious abnormalities on inspection of external nose and ears  NECK: normal movements of the head and neck  LUNGS: on inspection no signs of respiratory distress, breathing rate appears normal,  no obvious gross SOB, gasping or wheezing  CV: no obvious cyanosis  MS: moves all visible extremities without noticeable abnormality  PSYCH/NEURO: pleasant and cooperative, no obvious depression or anxiety, speech and thought processing grossly intact  ASSESSMENT AND PLAN:  Discussed the following assessment and plan:  Chronic frontal sinusitis - Plan: azithromycin (ZITHROMAX) 250 MG tablet, sodium chloride (OCEAN) 0.65 % SOLN nasal spray, benzonatate (TESSALON) 200 MG capsule Prn Flonase  Cont allegra prn   Cough - Plan: benzonatate (TESSALON) 200 MG capsule rec Mucinex dm green label cough   PND  (post-nasal drip) - Plan: ipratropium (ATROVENT) 0.06 % nasal spray  -we discussed possible serious and likely etiologies, options for evaluation and workup, limitations of telemedicine visit vs in person visit, treatment, treatment risks and precautions.   I discussed the assessment and treatment plan with the patient. The patient was provided an opportunity to ask questions and all were answered. The patient agreed with the plan and demonstrated an understanding of the instructions.    Time spent 20 min Delorise Jackson, MD

## 2019-11-13 DIAGNOSIS — M7661 Achilles tendinitis, right leg: Secondary | ICD-10-CM | POA: Diagnosis not present

## 2019-11-18 DIAGNOSIS — M7662 Achilles tendinitis, left leg: Secondary | ICD-10-CM | POA: Diagnosis not present

## 2019-11-25 ENCOUNTER — Other Ambulatory Visit: Payer: Self-pay | Admitting: Internal Medicine

## 2019-12-02 DIAGNOSIS — M7662 Achilles tendinitis, left leg: Secondary | ICD-10-CM | POA: Diagnosis not present

## 2019-12-02 DIAGNOSIS — M7661 Achilles tendinitis, right leg: Secondary | ICD-10-CM | POA: Diagnosis not present

## 2019-12-05 DIAGNOSIS — Z1211 Encounter for screening for malignant neoplasm of colon: Secondary | ICD-10-CM

## 2019-12-16 DIAGNOSIS — M7662 Achilles tendinitis, left leg: Secondary | ICD-10-CM | POA: Diagnosis not present

## 2019-12-16 DIAGNOSIS — M7661 Achilles tendinitis, right leg: Secondary | ICD-10-CM | POA: Diagnosis not present

## 2019-12-23 DIAGNOSIS — M7661 Achilles tendinitis, right leg: Secondary | ICD-10-CM | POA: Diagnosis not present

## 2019-12-23 DIAGNOSIS — M7662 Achilles tendinitis, left leg: Secondary | ICD-10-CM | POA: Diagnosis not present

## 2019-12-30 DIAGNOSIS — M170 Bilateral primary osteoarthritis of knee: Secondary | ICD-10-CM | POA: Diagnosis not present

## 2019-12-30 DIAGNOSIS — M7661 Achilles tendinitis, right leg: Secondary | ICD-10-CM | POA: Diagnosis not present

## 2019-12-30 DIAGNOSIS — M7662 Achilles tendinitis, left leg: Secondary | ICD-10-CM | POA: Diagnosis not present

## 2020-01-06 ENCOUNTER — Telehealth (INDEPENDENT_AMBULATORY_CARE_PROVIDER_SITE_OTHER): Payer: Self-pay | Admitting: Gastroenterology

## 2020-01-06 DIAGNOSIS — M7661 Achilles tendinitis, right leg: Secondary | ICD-10-CM | POA: Diagnosis not present

## 2020-01-06 DIAGNOSIS — M7662 Achilles tendinitis, left leg: Secondary | ICD-10-CM | POA: Diagnosis not present

## 2020-01-06 DIAGNOSIS — Z86010 Personal history of colonic polyps: Secondary | ICD-10-CM

## 2020-01-06 MED ORDER — NA SULFATE-K SULFATE-MG SULF 17.5-3.13-1.6 GM/177ML PO SOLN: 1.0000 | Freq: Once | ORAL | 0 refills | Status: AC

## 2020-01-06 NOTE — Progress Notes (Signed)
Gastroenterology Pre-Procedure Review  Request Date: Friday 02/21/20 Requesting Physician: Dr. Bonna Gains  PATIENT REVIEW QUESTIONS: The patient responded to the following health history questions as indicated:    1. Are you having any GI issues? no 2. Do you have a personal history of Polyps? yes (06/20/14 Colonoscopy performed by Dr. Mardee Postin at [facility]) 3. Do you have a family history of Colon Cancer or Polyps? yes (Father colon cancer, paternal grandmother colon cancer) 60. Diabetes Mellitus? no 5. Joint replacements in the past 12 months?no 6. Major health problems in the past 3 months?no 7. Any artificial heart valves, MVP, or defibrillator?no    MEDICATIONS & ALLERGIES:    Patient reports the following regarding taking any anticoagulation/antiplatelet therapy:   Plavix, Coumadin, Eliquis, Xarelto, Lovenox, Pradaxa, Brilinta, or Effient? no Aspirin? yes (81 mg daily)  Patient confirms/reports the following medications:  Current Outpatient Medications  Medication Sig Dispense Refill   acyclovir (ZOVIRAX) 400 MG tablet TAKE 1 TABLET (400 MG TOTAL) BY MOUTH EVERY 4 (FOUR) HOURS WHILE AWAKE. 28 tablet 1   aspirin 81 MG tablet Take 81 mg by mouth daily.     atorvastatin (LIPITOR) 20 MG tablet TAKE 1 TABLET BY MOUTH EVERY DAY 90 tablet 1   azithromycin (ZITHROMAX) 250 MG tablet 2 pills day 1 and 1 pill day 2-5 with food 6 tablet 0   benzonatate (TESSALON) 200 MG capsule Take 1 capsule (200 mg total) by mouth 3 (three) times daily as needed for cough. 30 capsule 0   buPROPion (WELLBUTRIN XL) 150 MG 24 hr tablet TAKE 1 TABLET BY MOUTH EVERY DAY 90 tablet 1   diclofenac Sodium (VOLTAREN) 1 % GEL diclofenac 1 % topical gel     fish oil-omega-3 fatty acids 1000 MG capsule Take 2 g by mouth daily.     ipratropium (ATROVENT) 0.06 % nasal spray Place 2 sprays into both nostrils 3 (three) times daily. 15 mL 0   losartan (COZAAR) 50 MG tablet TAKE 1 TABLET BY MOUTH EVERY DAY 90 tablet 1    meloxicam (MOBIC) 15 MG tablet      metoprolol succinate (TOPROL-XL) 25 MG 24 hr tablet TAKE 1 TABLET BY MOUTH EVERY DAY 90 tablet 1   Multiple Vitamin (MULTIVITAMIN) tablet Take 1 tablet by mouth daily.     PREMARIN vaginal cream USE 1 TO 2 GRAMS INTRAVAGINALLY TWICE WEEKLY 30 g 2   sodium chloride (OCEAN) 0.65 % SOLN nasal spray Place 1 spray into both nostrils as needed for congestion. 30 mL 2   No current facility-administered medications for this visit.    Patient confirms/reports the following allergies:  Allergies  Allergen Reactions   Clindamycin/Lincomycin Rash    No orders of the defined types were placed in this encounter.   AUTHORIZATION INFORMATION Primary Insurance: 1D#: Group #:  Secondary Insurance: 1D#: Group #:  SCHEDULE INFORMATION: Date: 02/21/20 Time: Location:[facility]

## 2020-01-20 DIAGNOSIS — M7662 Achilles tendinitis, left leg: Secondary | ICD-10-CM | POA: Diagnosis not present

## 2020-01-20 DIAGNOSIS — M7661 Achilles tendinitis, right leg: Secondary | ICD-10-CM | POA: Diagnosis not present

## 2020-02-19 ENCOUNTER — Other Ambulatory Visit
Admission: RE | Admit: 2020-02-19 | Discharge: 2020-02-19 | Disposition: A | Discharge status: Home / Self Care | Payer: Medicare PPO | Source: Ambulatory Visit | Attending: Gastroenterology | Admitting: Gastroenterology

## 2020-02-19 ENCOUNTER — Other Ambulatory Visit: Payer: Self-pay

## 2020-02-19 DIAGNOSIS — Z20822 Contact with and (suspected) exposure to covid-19: Secondary | ICD-10-CM | POA: Diagnosis not present

## 2020-02-19 DIAGNOSIS — Z01812 Encounter for preprocedural laboratory examination: Secondary | ICD-10-CM | POA: Diagnosis not present

## 2020-02-19 LAB — SARS CORONAVIRUS 2 (TAT 6-24 HRS): SARS Coronavirus 2: NEGATIVE

## 2020-02-20 ENCOUNTER — Encounter: Payer: Self-pay | Admitting: Gastroenterology

## 2020-02-21 ENCOUNTER — Encounter
Admission: RE | Disposition: A | Discharge status: Home / Self Care | Payer: Self-pay | Source: Home / Self Care | Attending: Gastroenterology

## 2020-02-21 ENCOUNTER — Ambulatory Visit: Payer: Medicare PPO | Admitting: Certified Registered Nurse Anesthetist

## 2020-02-21 ENCOUNTER — Encounter: Payer: Self-pay | Admitting: Gastroenterology

## 2020-02-21 ENCOUNTER — Other Ambulatory Visit: Payer: Self-pay

## 2020-02-21 ENCOUNTER — Ambulatory Visit
Admission: RE | Admit: 2020-02-21 | Discharge: 2020-02-21 | Disposition: A | Discharge status: Home / Self Care | Payer: Medicare PPO | Attending: Gastroenterology | Admitting: Gastroenterology

## 2020-02-21 DIAGNOSIS — K5730 Diverticulosis of large intestine without perforation or abscess without bleeding: Secondary | ICD-10-CM | POA: Insufficient documentation

## 2020-02-21 DIAGNOSIS — K6289 Other specified diseases of anus and rectum: Secondary | ICD-10-CM | POA: Diagnosis not present

## 2020-02-21 DIAGNOSIS — K219 Gastro-esophageal reflux disease without esophagitis: Secondary | ICD-10-CM | POA: Diagnosis not present

## 2020-02-21 DIAGNOSIS — Z791 Long term (current) use of non-steroidal anti-inflammatories (NSAID): Secondary | ICD-10-CM | POA: Diagnosis not present

## 2020-02-21 DIAGNOSIS — Z79899 Other long term (current) drug therapy: Secondary | ICD-10-CM | POA: Insufficient documentation

## 2020-02-21 DIAGNOSIS — Z881 Allergy status to other antibiotic agents status: Secondary | ICD-10-CM | POA: Insufficient documentation

## 2020-02-21 DIAGNOSIS — Z1211 Encounter for screening for malignant neoplasm of colon: Secondary | ICD-10-CM | POA: Insufficient documentation

## 2020-02-21 DIAGNOSIS — Z86010 Personal history of colonic polyps: Secondary | ICD-10-CM | POA: Diagnosis not present

## 2020-02-21 DIAGNOSIS — E785 Hyperlipidemia, unspecified: Secondary | ICD-10-CM | POA: Diagnosis not present

## 2020-02-21 DIAGNOSIS — K5790 Diverticulosis of intestine, part unspecified, without perforation or abscess without bleeding: Secondary | ICD-10-CM | POA: Diagnosis not present

## 2020-02-21 DIAGNOSIS — Z7982 Long term (current) use of aspirin: Secondary | ICD-10-CM | POA: Insufficient documentation

## 2020-02-21 DIAGNOSIS — Z800 Family history of malignant neoplasm of digestive organs: Secondary | ICD-10-CM | POA: Diagnosis not present

## 2020-02-21 DIAGNOSIS — D125 Benign neoplasm of sigmoid colon: Secondary | ICD-10-CM | POA: Diagnosis not present

## 2020-02-21 DIAGNOSIS — K635 Polyp of colon: Secondary | ICD-10-CM | POA: Diagnosis not present

## 2020-02-21 DIAGNOSIS — Z6841 Body Mass Index (BMI) 40.0 and over, adult: Secondary | ICD-10-CM | POA: Diagnosis not present

## 2020-02-21 HISTORY — PX: COLONOSCOPY WITH PROPOFOL: SHX5780

## 2020-02-21 SURGERY — COLONOSCOPY WITH PROPOFOL
Anesthesia: General

## 2020-02-21 MED ORDER — PROPOFOL 500 MG/50ML IV EMUL
INTRAVENOUS | Status: DC | PRN
  Administered 2020-02-21: 140 ug/kg/min via INTRAVENOUS

## 2020-02-21 MED ORDER — LIDOCAINE HCL (CARDIAC) PF 100 MG/5ML IV SOSY
PREFILLED_SYRINGE | INTRAVENOUS | Status: DC | PRN
  Administered 2020-02-21: 100 mg via INTRAVENOUS

## 2020-02-21 MED ORDER — GLYCOPYRROLATE 0.2 MG/ML IJ SOLN
INTRAMUSCULAR | Status: AC
  Filled 2020-02-21: qty 1

## 2020-02-21 MED ORDER — PROPOFOL 10 MG/ML IV BOLUS
INTRAVENOUS | Status: DC | PRN
  Administered 2020-02-21: 40 mg via INTRAVENOUS
  Administered 2020-02-21 (×2): 10 mg via INTRAVENOUS

## 2020-02-21 MED ORDER — PHENYLEPHRINE HCL (PRESSORS) 10 MG/ML IV SOLN
INTRAVENOUS | Status: DC | PRN
  Administered 2020-02-21 (×6): 100 ug via INTRAVENOUS

## 2020-02-21 MED ORDER — SODIUM CHLORIDE 0.9 % IV SOLN
INTRAVENOUS | Status: DC
  Administered 2020-02-21: 20 mL/h via INTRAVENOUS

## 2020-02-21 MED ORDER — EPHEDRINE 5 MG/ML INJ
INTRAVENOUS | Status: AC
  Filled 2020-02-21: qty 10

## 2020-02-21 NOTE — Op Note (Signed)
[facility] Gastroenterology Patient Name: Michele Garza Procedure Date: 02/21/2020 8:38 AM MRN: [id number] Account #: [id number] Date of Birth: [birth year] Admit Type: Outpatient Age: 70 Room: [facility] ENDO ROOM 1 Gender: Female Note Status: Finalized Procedure:             Colonoscopy Indications:           Screening in patient at increased risk: Family history                         of 1st-degree relative with colorectal cancer Providers:             Franziska Podgurski B. Bonna Gains MD, MD Medicines:             Monitored Anesthesia Care Complications:         No immediate complications. Procedure:             Pre-Anesthesia Assessment:                        - ASA Grade Assessment: II - A patient with mild                         systemic disease.                        - Prior to the procedure, a History and Physical was                         performed, and patient medications, allergies and                         sensitivities were reviewed. The patient's tolerance                         of previous anesthesia was reviewed.                        - The risks and benefits of the procedure and the                         sedation options and risks were discussed with the                         patient. All questions were answered and informed                         consent was obtained.                        - Patient identification and proposed procedure were                         verified prior to the procedure by the physician, the                         nurse, the anesthesiologist, the anesthetist and the                         technician. The procedure was verified in the  procedure room.                        After obtaining informed consent, the colonoscope was                         passed under direct vision. Throughout the procedure,                         the patient's blood pressure, pulse, and oxygen                          saturations were monitored continuously. The                         Colonoscope was introduced through the anus and                         advanced to the the cecum, identified by appendiceal                         orifice and ileocecal valve. The colonoscopy was                         performed with ease. The patient tolerated the                         procedure well. The quality of the bowel preparation                         was good. Findings:      The perianal and digital rectal examinations were normal.      A 6 mm polyp was found in the sigmoid colon. The polyp was sessile. The       polyp was removed with a cold snare. Resection and retrieval were       complete.      An area of mildly nodular mucosa was found in the rectum. Biopsies were       taken with a cold forceps for histology.      Multiple diverticula were found in the sigmoid colon.      The exam was otherwise without abnormality.      The rectum, sigmoid colon, descending colon, transverse colon, ascending       colon and cecum appeared normal.      The retroflexed view of the distal rectum and anal verge was normal and       showed no anal or rectal abnormalities. Impression:            - One 6 mm polyp in the sigmoid colon, removed with a                         cold snare. Resected and retrieved.                        - Nodular mucosa in the rectum. Biopsied.                        - Diverticulosis in the sigmoid colon.                        -  The examination was otherwise normal.                        - The rectum, sigmoid colon, descending colon,                         transverse colon, ascending colon and cecum are normal.                        - The distal rectum and anal verge are normal on                         retroflexion view. Recommendation:        - Discharge patient to home (with escort).                        - Advance diet as tolerated.                        - Continue present  medications.                        - Await pathology results.                        - Repeat colonoscopy in 5 years.                        - The findings and recommendations were discussed with                         the patient.                        - The findings and recommendations were discussed with                         the patient's family.                        - Return to primary care physician as previously                         scheduled.                        - High fiber diet. Procedure Code(s):     --- Professional ---                        [phone], Colonoscopy, flexible; with removal of                         tumor(s), polyp(s), or other lesion(s) by snare                         technique                        72536, 83, Colonoscopy, flexible; with biopsy, single                         or  multiple Diagnosis Code(s):     --- Professional ---                        Z80.0, Family history of malignant neoplasm of                         digestive organs                        K63.5, Polyp of colon                        K62.89, Other specified diseases of anus and rectum CPT copyright 2019 American Medical Association. All rights reserved. The codes documented in this report are preliminary and upon coder review may  be revised to meet current compliance requirements.  Vonda Antigua, MD Margretta Sidle B. Bonna Gains MD, MD 02/21/2020 9:14:31 AM This report has been signed electronically. Number of Addenda: 0 Note Initiated On: 02/21/2020 8:38 AM Scope Withdrawal Time: 0 hours 21 minutes 7 seconds  Total Procedure Duration: 0 hours 25 minutes 59 seconds  Estimated Blood Loss:  Estimated blood loss: none.      [facility]

## 2020-02-21 NOTE — Anesthesia Procedure Notes (Signed)
Procedure Name: MAC Date/Time: 02/21/2020 8:38 AM Performed by: Lily Peer, Amiria Orrison, CRNA Pre-anesthesia Checklist: Patient identified, Emergency Drugs available, Suction available, Patient being monitored and Timeout performed Patient Re-evaluated:Patient Re-evaluated prior to induction Oxygen Delivery Method: Nasal cannula Induction Type: IV induction

## 2020-02-21 NOTE — Transfer of Care (Signed)
Immediate Anesthesia Transfer of Care Note  Patient: Michele Garza  Procedure(s) Performed: COLONOSCOPY WITH PROPOFOL (N/A )  Patient Location: Nursing Unit  Anesthesia Type:General  Level of Consciousness: drowsy  Airway & Oxygen Therapy: Patient Spontanous Breathing  Post-op Assessment: Report given to RN and Post -op Vital signs reviewed and stable  Post vital signs: Reviewed and stable  Last Vitals:  Vitals Value Taken Time  BP 99/49   Temp    Pulse 79 02/21/20 0915  Resp 16 02/21/20 0915  SpO2 98 % 02/21/20 0915  Vitals shown include unvalidated device data.  Last Pain:  Vitals:   02/21/20 0751  TempSrc: Temporal  PainSc: 0-No pain         Complications: No complications documented.

## 2020-02-21 NOTE — H&P (Signed)
Vonda Antigua, MD [address], [place], [place], [place], [address] [address], [place], [place], [place], [address] Phone: [phone]  Fax: [phone]  Primary Care Physician:  Crecencio Mc, MD   Pre-Procedure History & Physical: HPI:  Michele Garza is a 70 y.o. female is here for a colonoscopy.   Past Medical History:  Diagnosis Date  . Depression   . Diverticulosis of colon    one prior episode July 2011 caused by tomato overingestion  . GERD (gastroesophageal reflux disease)   . History of colonoscopy    done in  dec 2011, repeat due 5 yrs due to [place]  . Hypertension     Past Surgical History:  Procedure Laterality Date  . BREAST SURGERY     sterotactic breaswt biopsy: atypical cells,  continued diagnostic mammograms  . COLONOSCOPY      Prior to Admission medications   Medication Sig Start Date End Date Taking? Authorizing Provider  acyclovir (ZOVIRAX) 400 MG tablet TAKE 1 TABLET (400 MG TOTAL) BY MOUTH EVERY 4 (FOUR) HOURS WHILE AWAKE. 10/23/19  Yes Crecencio Mc, MD  aspirin 81 MG tablet Take 81 mg by mouth daily.   Yes [provider]  atorvastatin (LIPITOR) 20 MG tablet TAKE 1 TABLET BY MOUTH EVERY DAY 08/22/19  Yes Crecencio Mc, MD  benzonatate (TESSALON) 200 MG capsule Take 1 capsule (200 mg total) by mouth 3 (three) times daily as needed for cough. 11/07/19  Yes McLean-Scocuzza, Nino Glow, MD  buPROPion (WELLBUTRIN XL) 150 MG 24 hr tablet TAKE 1 TABLET BY MOUTH EVERY DAY 11/25/19  Yes Crecencio Mc, MD  diclofenac Sodium (VOLTAREN) 1 % GEL diclofenac 1 % topical gel 12/08/17  Yes [provider]  fish oil-omega-3 fatty acids 1000 MG capsule Take 2 g by mouth daily.   Yes [provider]  ipratropium (ATROVENT) 0.06 % nasal spray Place 2 sprays into both nostrils 3 (three) times daily. 11/07/19  Yes McLean-Scocuzza, Nino Glow, MD  losartan (COZAAR) 50 MG tablet TAKE 1 TABLET BY MOUTH EVERY DAY 10/10/19  Yes Crecencio Mc,  MD  meloxicam (MOBIC) 15 MG tablet  04/01/19  Yes [provider]  metoprolol succinate (TOPROL-XL) 25 MG 24 hr tablet TAKE 1 TABLET BY MOUTH EVERY DAY 10/18/19  Yes Crecencio Mc, MD  Multiple Vitamin (MULTIVITAMIN) tablet Take 1 tablet by mouth daily.   Yes [provider]  PREMARIN vaginal cream USE 1 TO 2 GRAMS INTRAVAGINALLY TWICE WEEKLY 07/10/18  Yes Crecencio Mc, MD  sodium chloride (OCEAN) 0.65 % SOLN nasal spray Place 1 spray into both nostrils as needed for congestion. 11/07/19  Yes McLean-Scocuzza, Nino Glow, MD  azithromycin (ZITHROMAX) 250 MG tablet 2 pills day 1 and 1 pill day 2-5 with food Patient not taking: Reported on 02/21/2020 11/07/19   McLean-Scocuzza, Nino Glow, MD    Allergies as of 01/06/2020 - Review Complete 01/06/2020  Allergen Reaction Noted  . Clindamycin/lincomycin Rash 10/26/2010    Family History  Problem Relation Age of Onset  . Cancer Mother        bilatera, contralateral recurred after 30 yrs  . Stroke Father   . Heart disease Father 24       died of stroke  at 10, tobacco user  . Cancer Father        colon Ca  . Cancer Paternal Grandmother 101       metastatic colon Ca  . Cancer Maternal Uncle  breast x 2 aunts    Social History   Socioeconomic History  . Marital status: Divorced    Spouse name: Not on file  . Number of children: Not on file  . Years of education: Not on file  . Highest education level: Not on file  Occupational History  . 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 Smoking status: Never Smoker  . Smokeless tobacco: Never Used  Vaping Use  . Vaping Use: Never used  Substance and Sexual Activity  . Alcohol use: Yes    Alcohol/week: 1.0 standard drink    Types: 1 Shots of liquor per week    Comment: occasional  . Drug use: No  . Sexual activity: Not on file  Other Topics Concern  . Not on file  Social History  Narrative  . Not on file   Social Determinants of Health   Financial Resource Strain: Low Risk   . Difficulty of Paying Living Expenses: Not hard at all  Food Insecurity: Not on file  Transportation Needs: No Transportation Needs  . Lack of Transportation (Medical): No  . Lack of Transportation (Non-Medical): No  Physical Activity: Not on file  Stress: No Stress Concern Present  . Feeling of Stress : Not at all  Social Connections: Unknown  . Frequency of Communication with Friends and Family: Not on file  . Frequency of Social Gatherings with Friends and Family: Not on file  . Attends Religious Services: More than 4 times per year  . Active Member of Clubs or Organizations: Yes  . Attends Archivist Meetings: Not on file  . Marital Status: Not on file  Intimate Partner Violence: Not on file    Review of Systems: See HPI, otherwise negative ROS  Physical Exam: BP (!) 163/90   Pulse (!) 101   Temp (!) 97 F (36.1 C) (Temporal)   Resp 20   Ht 5\' 3"  (1.6 m)   Wt 95.3 kg   SpO2 97%   BMI 37.20 kg/m  General:   Alert,  pleasant and cooperative in NAD Head:  Normocephalic and atraumatic. Neck:  Supple; no masses or thyromegaly. Lungs:  Clear throughout to auscultation, normal respiratory effort.    Heart:  +S1, +S2, Regular rate and rhythm, No edema. Abdomen:  Soft, nontender and nondistended. Normal bowel sounds, without guarding, and without rebound.   Neurologic:  Alert and  oriented x4;  grossly normal neurologically.  Impression/Plan: Michele Garza is here for a colonoscopy to be performed for family history of colon cancer  Risks, benefits, limitations, and alternatives regarding  colonoscopy have been reviewed with the patient.  Questions have been answered.  All parties agreeable.   Virgel Manifold, MD  02/21/2020, 8:23 AM

## 2020-02-21 NOTE — Anesthesia Preprocedure Evaluation (Signed)
Anesthesia Evaluation  Patient identified by MRN, date of birth, ID band Patient awake    Reviewed: Allergy & Precautions, NPO status , Patient's Chart, lab work & pertinent test results  History of Anesthesia Complications Negative for: history of anesthetic complications  Airway Mallampati: III       Dental   Pulmonary neg sleep apnea, neg COPD, Not current smoker,           Cardiovascular hypertension, Pt. on medications and Pt. on home beta blockers (-) Past MI and (-) CHF (-) dysrhythmias (-) Valvular Problems/Murmurs     Neuro/Psych neg Seizures Depression    GI/Hepatic Neg liver ROS, GERD  Medicated and Controlled,  Endo/Other  neg diabetesMorbid obesity  Renal/GU negative Renal ROS     Musculoskeletal   Abdominal   Peds  Hematology   Anesthesia Other Findings   Reproductive/Obstetrics                             Anesthesia Physical Anesthesia Plan  ASA: III  Anesthesia Plan: General   Post-op Pain Management:    Induction: Intravenous  PONV Risk Score and Plan: 3 and Propofol infusion and TIVA  Airway Management Planned: Nasal Cannula  Additional Equipment:   Intra-op Plan:   Post-operative Plan:   Informed Consent: I have reviewed the patients History and Physical, chart, labs and discussed the procedure including the risks, benefits and alternatives for the proposed anesthesia with the patient or authorized representative who has indicated his/her understanding and acceptance.       Plan Discussed with:   Anesthesia Plan Comments:         Anesthesia Quick Evaluation

## 2020-02-21 NOTE — Anesthesia Postprocedure Evaluation (Signed)
Anesthesia Post Note  Patient: Michele Garza  Procedure(s) Performed: COLONOSCOPY WITH PROPOFOL (N/A )  Patient location during evaluation: Endoscopy Anesthesia Type: General Level of consciousness: awake and alert Pain management: pain level controlled Vital Signs Assessment: post-procedure vital signs reviewed and stable Respiratory status: spontaneous breathing and respiratory function stable Cardiovascular status: stable Anesthetic complications: no   No complications documented.   Last Vitals:  Vitals:   02/21/20 0914 02/21/20 0924  BP: (!) 99/49 109/60  Pulse: 79 75  Resp: 16 14  Temp: 36.8 C   SpO2: 98% 99%    Last Pain:  Vitals:   02/21/20 0924  TempSrc:   PainSc: 0-No pain                 Hiroshi Krummel K

## 2020-02-24 LAB — SURGICAL PATHOLOGY

## 2020-02-25 ENCOUNTER — Encounter: Payer: Self-pay | Admitting: Gastroenterology

## 2020-03-04 DIAGNOSIS — S86012A Strain of left Achilles tendon, initial encounter: Secondary | ICD-10-CM | POA: Diagnosis not present

## 2020-03-16 ENCOUNTER — Inpatient Hospital Stay: Payer: Medicare PPO

## 2020-03-16 ENCOUNTER — Inpatient Hospital Stay
Admission: EM | Admit: 2020-03-16 | Discharge: 2020-03-20 | DRG: 163 | Disposition: A | Discharge status: Home Health | Payer: Medicare PPO | Attending: Internal Medicine | Admitting: Internal Medicine

## 2020-03-16 ENCOUNTER — Other Ambulatory Visit (INDEPENDENT_AMBULATORY_CARE_PROVIDER_SITE_OTHER): Payer: Self-pay | Admitting: Vascular Surgery

## 2020-03-16 ENCOUNTER — Inpatient Hospital Stay (HOSPITAL_COMMUNITY)
Admit: 2020-03-16 | Discharge: 2020-03-16 | Disposition: A | Discharge status: Home / Self Care | Payer: Medicare PPO | Attending: Pulmonary Disease | Admitting: Pulmonary Disease

## 2020-03-16 ENCOUNTER — Emergency Department: Payer: Medicare PPO

## 2020-03-16 ENCOUNTER — Other Ambulatory Visit: Payer: Self-pay

## 2020-03-16 DIAGNOSIS — I959 Hypotension, unspecified: Secondary | ICD-10-CM | POA: Diagnosis not present

## 2020-03-16 DIAGNOSIS — I2609 Other pulmonary embolism with acute cor pulmonale: Secondary | ICD-10-CM

## 2020-03-16 DIAGNOSIS — R55 Syncope and collapse: Secondary | ICD-10-CM | POA: Diagnosis not present

## 2020-03-16 DIAGNOSIS — Z8249 Family history of ischemic heart disease and other diseases of the circulatory system: Secondary | ICD-10-CM

## 2020-03-16 DIAGNOSIS — Z800 Family history of malignant neoplasm of digestive organs: Secondary | ICD-10-CM | POA: Diagnosis not present

## 2020-03-16 DIAGNOSIS — E041 Nontoxic single thyroid nodule: Secondary | ICD-10-CM | POA: Diagnosis present

## 2020-03-16 DIAGNOSIS — Z881 Allergy status to other antibiotic agents status: Secondary | ICD-10-CM | POA: Diagnosis not present

## 2020-03-16 DIAGNOSIS — F32A Depression, unspecified: Secondary | ICD-10-CM | POA: Diagnosis not present

## 2020-03-16 DIAGNOSIS — I248 Other forms of acute ischemic heart disease: Secondary | ICD-10-CM | POA: Diagnosis not present

## 2020-03-16 DIAGNOSIS — Z791 Long term (current) use of non-steroidal anti-inflammatories (NSAID): Secondary | ICD-10-CM

## 2020-03-16 DIAGNOSIS — E079 Disorder of thyroid, unspecified: Secondary | ICD-10-CM | POA: Diagnosis not present

## 2020-03-16 DIAGNOSIS — R7989 Other specified abnormal findings of blood chemistry: Secondary | ICD-10-CM

## 2020-03-16 DIAGNOSIS — Z79899 Other long term (current) drug therapy: Secondary | ICD-10-CM | POA: Diagnosis not present

## 2020-03-16 DIAGNOSIS — E1165 Type 2 diabetes mellitus with hyperglycemia: Secondary | ICD-10-CM | POA: Diagnosis not present

## 2020-03-16 DIAGNOSIS — Z7982 Long term (current) use of aspirin: Secondary | ICD-10-CM | POA: Diagnosis not present

## 2020-03-16 DIAGNOSIS — K5730 Diverticulosis of large intestine without perforation or abscess without bleeding: Secondary | ICD-10-CM | POA: Diagnosis present

## 2020-03-16 DIAGNOSIS — I2699 Other pulmonary embolism without acute cor pulmonale: Principal | ICD-10-CM | POA: Diagnosis present

## 2020-03-16 DIAGNOSIS — R42 Dizziness and giddiness: Secondary | ICD-10-CM | POA: Diagnosis not present

## 2020-03-16 DIAGNOSIS — E785 Hyperlipidemia, unspecified: Secondary | ICD-10-CM | POA: Diagnosis present

## 2020-03-16 DIAGNOSIS — I10 Essential (primary) hypertension: Secondary | ICD-10-CM | POA: Diagnosis not present

## 2020-03-16 DIAGNOSIS — Z20822 Contact with and (suspected) exposure to covid-19: Secondary | ICD-10-CM | POA: Diagnosis present

## 2020-03-16 DIAGNOSIS — K8020 Calculus of gallbladder without cholecystitis without obstruction: Secondary | ICD-10-CM | POA: Diagnosis present

## 2020-03-16 DIAGNOSIS — I4891 Unspecified atrial fibrillation: Secondary | ICD-10-CM | POA: Diagnosis present

## 2020-03-16 DIAGNOSIS — K219 Gastro-esophageal reflux disease without esophagitis: Secondary | ICD-10-CM | POA: Diagnosis present

## 2020-03-16 DIAGNOSIS — E049 Nontoxic goiter, unspecified: Secondary | ICD-10-CM | POA: Diagnosis present

## 2020-03-16 DIAGNOSIS — N179 Acute kidney failure, unspecified: Secondary | ICD-10-CM | POA: Diagnosis not present

## 2020-03-16 DIAGNOSIS — R778 Other specified abnormalities of plasma proteins: Secondary | ICD-10-CM | POA: Diagnosis not present

## 2020-03-16 DIAGNOSIS — Z6841 Body Mass Index (BMI) 40.0 and over, adult: Secondary | ICD-10-CM

## 2020-03-16 DIAGNOSIS — F419 Anxiety disorder, unspecified: Secondary | ICD-10-CM | POA: Diagnosis present

## 2020-03-16 DIAGNOSIS — I2602 Saddle embolus of pulmonary artery with acute cor pulmonale: Secondary | ICD-10-CM | POA: Diagnosis not present

## 2020-03-16 DIAGNOSIS — R7401 Elevation of levels of liver transaminase levels: Secondary | ICD-10-CM | POA: Diagnosis present

## 2020-03-16 DIAGNOSIS — J9691 Respiratory failure, unspecified with hypoxia: Secondary | ICD-10-CM | POA: Diagnosis not present

## 2020-03-16 DIAGNOSIS — K6289 Other specified diseases of anus and rectum: Secondary | ICD-10-CM | POA: Diagnosis not present

## 2020-03-16 DIAGNOSIS — J9601 Acute respiratory failure with hypoxia: Secondary | ICD-10-CM | POA: Diagnosis present

## 2020-03-16 DIAGNOSIS — R0902 Hypoxemia: Secondary | ICD-10-CM | POA: Diagnosis not present

## 2020-03-16 DIAGNOSIS — Z803 Family history of malignant neoplasm of breast: Secondary | ICD-10-CM | POA: Diagnosis not present

## 2020-03-16 DIAGNOSIS — R064 Hyperventilation: Secondary | ICD-10-CM | POA: Diagnosis not present

## 2020-03-16 HISTORY — DX: Anxiety disorder, unspecified: F41.9

## 2020-03-16 HISTORY — DX: Other pulmonary embolism without acute cor pulmonale: I26.99

## 2020-03-16 LAB — PROTIME-INR
INR: 1.1 (ref 0.8–1.2)
Prothrombin Time: 14 seconds (ref 11.4–15.2)

## 2020-03-16 LAB — CBC WITH DIFFERENTIAL/PLATELET
Abs Immature Granulocytes: 0.04 10*3/uL (ref 0.00–0.07)
Basophils Absolute: 0 10*3/uL (ref 0.0–0.1)
Basophils Relative: 0 %
Eosinophils Absolute: 0 10*3/uL (ref 0.0–0.5)
Eosinophils Relative: 0 %
HCT: 39 % (ref 36.0–46.0)
Hemoglobin: 12.8 g/dL (ref 12.0–15.0)
Immature Granulocytes: 0 %
Lymphocytes Relative: 15 %
Lymphs Abs: 1.8 10*3/uL (ref 0.7–4.0)
MCH: 31.4 pg (ref 26.0–34.0)
MCHC: 32.8 g/dL (ref 30.0–36.0)
MCV: 95.8 fL (ref 80.0–100.0)
Monocytes Absolute: 0.5 10*3/uL (ref 0.1–1.0)
Monocytes Relative: 4 %
Neutro Abs: 9.7 10*3/uL — ABNORMAL HIGH (ref 1.7–7.7)
Neutrophils Relative %: 81 %
Platelets: 178 10*3/uL (ref 150–400)
RBC: 4.07 MIL/uL (ref 3.87–5.11)
RDW: 13.2 % (ref 11.5–15.5)
WBC: 12 10*3/uL — ABNORMAL HIGH (ref 4.0–10.5)
nRBC: 0 % (ref 0.0–0.2)

## 2020-03-16 LAB — HEPATIC FUNCTION PANEL
ALT: 51 U/L — ABNORMAL HIGH (ref 0–44)
AST: 71 U/L — ABNORMAL HIGH (ref 15–41)
Albumin: 3.8 g/dL (ref 3.5–5.0)
Alkaline Phosphatase: 55 U/L (ref 38–126)
Bilirubin, Direct: 0.2 mg/dL (ref 0.0–0.2)
Indirect Bilirubin: 0.6 mg/dL (ref 0.3–0.9)
Total Bilirubin: 0.8 mg/dL (ref 0.3–1.2)
Total Protein: 6.7 g/dL (ref 6.5–8.1)

## 2020-03-16 LAB — BASIC METABOLIC PANEL
Anion gap: 11 (ref 5–15)
BUN: 27 mg/dL — ABNORMAL HIGH (ref 8–23)
CO2: 22 mmol/L (ref 22–32)
Calcium: 9.2 mg/dL (ref 8.9–10.3)
Chloride: 103 mmol/L (ref 98–111)
Creatinine, Ser: 1.34 mg/dL — ABNORMAL HIGH (ref 0.44–1.00)
GFR, Estimated: 43 mL/min — ABNORMAL LOW (ref >60)
Glucose, Bld: 226 mg/dL — ABNORMAL HIGH (ref 70–99)
Potassium: 4.3 mmol/L (ref 3.5–5.1)
Sodium: 136 mmol/L (ref 135–145)

## 2020-03-16 LAB — ECHOCARDIOGRAM COMPLETE
AR max vel: 2.05 cm2
AV Area VTI: 2.25 cm2
AV Area mean vel: 2.06 cm2
AV Mean grad: 2 mmHg
AV Peak grad: 4.6 mmHg
Ao pk vel: 1.07 m/s
Area-P 1/2: 6.07 cm2
Height: 63 in
MV VTI: 2.61 cm2
S' Lateral: 2.1 cm
Weight: 3834.24 oz

## 2020-03-16 LAB — ANTITHROMBIN III: AntiThromb III Func: 105 % (ref 75–120)

## 2020-03-16 LAB — HEPARIN LEVEL (UNFRACTIONATED)
Heparin Unfractionated: 1.13 IU/mL — ABNORMAL HIGH (ref 0.30–0.70)
Heparin Unfractionated: 0.89 IU/mL — ABNORMAL HIGH (ref 0.30–0.70)

## 2020-03-16 LAB — APTT: aPTT: 27 seconds (ref 24–36)

## 2020-03-16 LAB — TROPONIN I (HIGH SENSITIVITY)
Troponin I (High Sensitivity): 215 ng/L (ref <18)
Troponin I (High Sensitivity): 376 ng/L (ref <18)

## 2020-03-16 LAB — HIV ANTIBODY (ROUTINE TESTING W REFLEX): HIV Screen 4th Generation wRfx: NONREACTIVE

## 2020-03-16 LAB — MRSA PCR SCREENING: MRSA by PCR: NEGATIVE

## 2020-03-16 LAB — BRAIN NATRIURETIC PEPTIDE: B Natriuretic Peptide: 477.1 pg/mL — ABNORMAL HIGH (ref 0.0–100.0)

## 2020-03-16 LAB — RESP PANEL BY RT-PCR (FLU A&B, COVID) ARPGX2
Influenza A by PCR: NEGATIVE
Influenza B by PCR: NEGATIVE
SARS Coronavirus 2 by RT PCR: NEGATIVE

## 2020-03-16 LAB — GLUCOSE, CAPILLARY: Glucose-Capillary: 154 mg/dL — ABNORMAL HIGH (ref 70–99)

## 2020-03-16 LAB — LIPASE, BLOOD: Lipase: 28 U/L (ref 11–51)

## 2020-03-16 MED ORDER — ALPRAZOLAM 0.5 MG PO TABS: 0.5000 mg | ORAL_TABLET | Freq: Three times a day (TID) | ORAL | Status: DC | PRN

## 2020-03-16 MED ORDER — HEPARIN BOLUS VIA INFUSION
5000.0000 [IU] | Freq: Once | INTRAVENOUS | Status: AC
  Administered 2020-03-16: 5000 [IU] via INTRAVENOUS
  Filled 2020-03-16: qty 5000

## 2020-03-16 MED ORDER — POLYETHYLENE GLYCOL 3350 17 G PO PACK: 17.0000 g | PACK | Freq: Every day | ORAL | Status: DC | PRN

## 2020-03-16 MED ORDER — CHLORHEXIDINE GLUCONATE CLOTH 2 % EX PADS
6.0000 | MEDICATED_PAD | Freq: Every day | CUTANEOUS | Status: DC
  Administered 2020-03-18 – 2020-03-19 (×2): 6 via TOPICAL
  Filled 2020-03-16: qty 6

## 2020-03-16 MED ORDER — PANTOPRAZOLE SODIUM 40 MG IV SOLR
40.0000 mg | Freq: Every day | INTRAVENOUS | Status: DC
  Administered 2020-03-16 – 2020-03-19 (×4): 40 mg via INTRAVENOUS
  Filled 2020-03-16 (×4): qty 40

## 2020-03-16 MED ORDER — HEPARIN (PORCINE) 25000 UT/250ML-% IV SOLN
800.0000 [IU]/h | INTRAVENOUS | Status: DC
  Administered 2020-03-16: 14:00:00 1100 [IU]/h via INTRAVENOUS
  Administered 2020-03-17: 950 [IU]/h via INTRAVENOUS
  Filled 2020-03-16: qty 250

## 2020-03-16 MED ORDER — ALPRAZOLAM 0.25 MG PO TABS
0.2500 mg | ORAL_TABLET | Freq: Three times a day (TID) | ORAL | Status: DC | PRN
  Administered 2020-03-16 (×2): 0.25 mg via ORAL
  Filled 2020-03-16 (×3): qty 1

## 2020-03-16 MED ORDER — DOCUSATE SODIUM 100 MG PO CAPS: 100.0000 mg | ORAL_CAPSULE | Freq: Two times a day (BID) | ORAL | Status: DC | PRN

## 2020-03-16 MED ORDER — SODIUM CHLORIDE 0.9 % IV BOLUS
500.0000 mL | Freq: Once | INTRAVENOUS | Status: AC
  Administered 2020-03-16: 500 mL via INTRAVENOUS

## 2020-03-16 MED ORDER — HEPARIN (PORCINE) 25000 UT/250ML-% IV SOLN
1350.0000 [IU]/h | INTRAVENOUS | Status: DC
  Administered 2020-03-16: 1350 [IU]/h via INTRAVENOUS
  Filled 2020-03-16: qty 250

## 2020-03-16 MED ORDER — HEPARIN BOLUS VIA INFUSION
5000.0000 [IU] | Freq: Once | INTRAVENOUS | Status: DC
  Filled 2020-03-16: qty 5000

## 2020-03-16 MED ORDER — MIDODRINE HCL 5 MG PO TABS
5.0000 mg | ORAL_TABLET | Freq: Three times a day (TID) | ORAL | Status: DC
  Administered 2020-03-16 – 2020-03-20 (×8): 5 mg via ORAL
  Filled 2020-03-16 (×9): qty 1

## 2020-03-16 MED ORDER — HEPARIN (PORCINE) 25000 UT/250ML-% IV SOLN: 1200.0000 [IU]/h | INTRAVENOUS | Status: DC

## 2020-03-16 MED ORDER — PERFLUTREN LIPID MICROSPHERE
1.0000 mL | INTRAVENOUS | Status: AC | PRN
  Administered 2020-03-16: 3 mL via INTRAVENOUS
  Filled 2020-03-16: qty 10

## 2020-03-16 MED ORDER — LACTATED RINGERS IV BOLUS
1000.0000 mL | Freq: Once | INTRAVENOUS | Status: AC
  Administered 2020-03-16: 1000 mL via INTRAVENOUS

## 2020-03-16 MED ORDER — LACTATED RINGERS IV SOLN: INTRAVENOUS | Status: DC

## 2020-03-16 MED ORDER — ONDANSETRON HCL 4 MG/2ML IJ SOLN
INTRAMUSCULAR | Status: AC
  Administered 2020-03-16: 4 mg via INTRAVENOUS
  Filled 2020-03-16: qty 2

## 2020-03-16 MED ORDER — IOHEXOL 350 MG/ML SOLN
75.0000 mL | Freq: Once | INTRAVENOUS | Status: AC | PRN
  Administered 2020-03-16: 75 mL via INTRAVENOUS

## 2020-03-16 MED ORDER — ONDANSETRON HCL 4 MG/2ML IJ SOLN
4.0000 mg | Freq: Four times a day (QID) | INTRAMUSCULAR | Status: DC | PRN
  Administered 2020-03-16 – 2020-03-17 (×2): 4 mg via INTRAVENOUS
  Filled 2020-03-16 (×2): qty 2

## 2020-03-16 MED ORDER — SODIUM CHLORIDE 0.9 % IV BOLUS
1000.0000 mL | Freq: Once | INTRAVENOUS | Status: AC
  Administered 2020-03-16: 1000 mL via INTRAVENOUS

## 2020-03-16 NOTE — ED Notes (Signed)
Patient placed on 15L via non-rebreather. Reports shortness of breath and anxiety. Pt oxygen improved to 99%.

## 2020-03-16 NOTE — Progress Notes (Signed)
[place] for Heparin Infusion Indication: pulmonary embolus  Allergies  Allergen Reactions  . Clindamycin/Lincomycin Rash    Patient Measurements: Height: 5\' 3"  (160 cm) Weight: 108.7 kg (239 lb 10.2 oz) IBW/kg (Calculated) : 52.4 Heparin Dosing Weight: 78.9kg  Vital Signs: Temp: 98.8 F (37.1 C) (02/28 1630) Temp Source: Axillary (02/28 1630) BP: 117/73 (02/28 1830) Pulse Rate: 110 (02/28 1830)  Labs: Recent Labs    03/16/20 0233 03/16/20 0509 03/16/20 1105 03/16/20 2001  HGB 12.8  --   --   --   HCT 39.0  --   --   --   PLT 178  --   --   --   APTT  --  27  --   --   LABPROT  --  14.0  --   --   INR  --  1.1  --   --   HEPARINUNFRC  --   --  1.13* 0.89*  CREATININE 1.34*  --   --   --   TROPONINIHS 215*  --  376*  --     Estimated Creatinine Clearance: 47.5 mL/min (A) (by C-G formula based on SCr of 1.34 mg/dL (H)).   Medical History: Past Medical History:  Diagnosis Date  . Anxiety   . Depression   . Diverticulosis of colon    one prior episode July 2011 caused by tomato overingestion  . GERD (gastroesophageal reflux disease)   . History of colonoscopy    done in  dec 2011, repeat due 5 yrs due to [place]  . Hypertension     Assessment: Pt is 70 yo female after near syncopal event with CT angio of chest was positive for pulmonary emboli with clot in both main pulmonary arteries & lobar arteries. Heparin Dosing Weight: 78.9kg H/H and plts WNL  Goal of Therapy:  Heparin level 0.3-0.7 units/ml Monitor platelets by anticoagulation protocol: Yes   Date Time HL Rate/comment 2/28 1105 1.13 1350 units/hr 2/28 2001 0.89 1100 units/hr  Plan:  Decrease heparin rate to 950 units/hr Recheck HL in 6 hours and daily while on heparin CBC daily while on heparin drip Continue to monitor H/H and platelets   Darnelle Bos, PharmD Clinical Pharmacist 03/16/2020 8:27 PM

## 2020-03-16 NOTE — ED Notes (Signed)
ED Provider at bedside regarding update on plan of care and results.

## 2020-03-16 NOTE — ED Notes (Signed)
Patient transported to CT 

## 2020-03-16 NOTE — H&P (Signed)
NAME:  Michele Garza, MRN:  [id number], DOB:  [birth year], LOS: 0 ADMISSION DATE:  03/16/2020, CONSULTATION DATE:  03/16/2020 REFERRING MD:  Dr. Karma Greaser, CHIEF COMPLAINT:  Near Syncope/ Anxiety   Brief History:  70 yo F with submassive pulmonary emboli and right heart strain admitted to ICU.  History of Present Illness:  70 year old female presenting to the ED via EMS from home after a near syncopal event.  She reports feeling "odd" similar to times when she has had anxiety in the past.  She reported not feeling quite right since Saturday, 03/14/2020, but thought it was anxiety/nerves as she has a lot going on at the moment.  However on Sunday night to 02/24/20 she was attempting to walk her dog and felt as though she was going to pass out on the elevator.  She laid down on the floor of the elevator, holding the door open until a neighbor passed by and called EMS.  EMS stated her SPO2 was 88% on room air and that she was hyperventilating.   ED course: Initial vitals-afebrile at 98, RR-18, tachycardia at 102, BP-111/81, SPO2 98% on 3 L nasal cannula. Upon arrival to the ED she was no longer tachypneic but was more hypoxic with an SPO2 of 80% on room air.  She denies chest pain, and only admits to shortness of breath with exertion. She reports recently rupturing her left Achilles tendon in between that and chronic kidney injury her mobility has been reduced.  She has no history of blood clots, is vaccinated and boosted against COVID-19.  She denies fever, chills, sore throat, nausea, vomiting, abdominal pain and dysuria.  CT angio of chest was positive for pulmonary emboli with clot in both main pulmonary arteries & lobar arteries.  Evidence of right heart strain RV to LV ratio greater than 1.5.  The CT angio also showed a bulky thyroid goiter causing moderate compression of the trachea as well as nonspecific inflammation at the porta hepatis and of the gallbladder. While in the emergency department the  patient's oxygen requirements increased to a non rebreather.  When she attempted to get up she felt lightheaded and became hypoxic.  Before transferring up to the ICU her blood pressure dropped with systolics in the 32'D, and a liter bolus of saline was administered. PCCM was consulted for admission.  Past Medical History:  Hypertension Hyperlipidemia GERD Diverticulosis of the colon Depression Anxiety Obesity Significant Hospital Events:  03/16/20 - Admit to ICU on heparin drip for submassive pulmonary emboli and right heart strain.  Consults:    Procedures:    Significant Diagnostic Tests:  03/16/20 Cta chest PE>> positive for acute pulmonary embolus with clot in both main PAs and lobar arteries.  CT evidence of right heart strain RV to LV ratio suspicious for at least submassive PE > 1.5.   Trace pleural fluid and mild groundglass opacity no discrete pulmonary infarct.  Evidence also of nonspecific inflammation at the porta hepatis with small volume perihepatic ascites.   Questionable inflammation of the gallbladder.   Bulky thyroid goiter with retrosternal extension and moderate compression of the trachea at the thoracic inlet.   Micro Data:  03/16/2020 COVID-19 >> negative 03/16/2020 influenza A/B >> negative  Antimicrobials:    Interim History / Subjective:  Patient is sitting up in bed, no obvious distress while at rest.  Oxygen was titrated down to 10 L high flow nasal cannula.  No complaints of pain or nausea at this time.  Labs/ Imaging personally reviewed  EKG: Sinus Tachycardia with non-specific ST segment/ T wave changes, rate 109  Na+/ K+: 136/ 4.3  BUN/Cr.: 27/ 1.34 Serum CO2/ AG: 22/ 11  Hgb:12.8 Troponin: 215 BNP: 477  WBC/ TMAX: 12/afebrile at 98 Objective   Blood pressure 110/87, pulse (!) 101, temperature 98 F (36.7 C), temperature source Oral, resp. rate 19, height 5\' 3"  (1.6 m), weight 110 kg, SpO2 97 %.        Intake/Output Summary (Last 24  hours) at 03/16/2020 0509 Last data filed at 03/16/2020 0402 Gross per 24 hour  Intake 500 ml  Output --  Net 500 ml   Filed Weights   03/16/20 0224  Weight: 110 kg    Examination: General: Adult female, critically ill,  NAD HEENT: MM pink/moist, anicteric, atraumatic, neck supple Neuro: A&O x 4, able to follow commands, PERRL +3, MAE CV: s1s2 RRR, sinus tach on monitor, no r/m/g Pulm: Regular, non labored on 10 L high flow nasal cannula, breath sounds clear-BUL & diminished-BLL GI: soft, rounded, non tender, bs x 4 Skin: Minimal bruising around left ankle from recent Achilles injury,  no rashes/lesions noted Extremities: warm/dry, pulses + 2 R/P, +1 edema noted BLE  Resolved Hospital Problem list     Assessment & Plan:  Near Syncope in the setting of Submassive Pulmonary emboli Acute hypoxic respiratory distress Hypotension CTa chest: Positive for acute pulmonary embolus with clot in both main PAs and lobar arteries, RV to LV ratio greater than 1.5, PESI score:138/Class V, Wells score: 4.5, EKG: Sinus Tachycardia with non-specific ST/T wave changes. BNP: 477, Troponin: 215. Given 1 L NS in ED due to marginal BP's - Systemic Heparin drip per pharmacy protocol - Supplemental O2 to maintain SpO2 > 90% - Follow up BLE dopplers to assess for DVT - Hypercoagulable panel ordered - trend troponin - Obtain ECHO  - LR @ 50 mL/h - continuous pulse oximetry/cardiac monitoring - consider Vascular consult for IVC filter if BLE doppler studies are positive  Acute Kidney Injury  Baseline Cr: 0.90, Cr on admission:1.34 - Strict I/O's: alert provider if UOP < 0.5 mL/kg/hr - gentle IVF hydration: LR @ 50 mL/h - Daily BMP, replace electrolytes PRN - Avoid nephrotoxic agents as able, ensure adequate renal perfusion  Thyroid Mass Bulky thyroid goiter with retrosternal extension and moderate compression of the trachea at the thoracic inlet. - f/u Thyroid panel, TSH in 09/2019 WNL at 0.55 -  Obtain Thyroid US  Transaminitis in the setting of nonspecific inflammation at the [facility] & gallbladder AST: 71, ALT: 51. Previous hepatic function normal (9/21) - Trend hepatic function - f/u CT abdomen/pelvis with contrast when able - avoid hepatotoxic agents  Hyperlipidemia - Home regimen on hold: atorvastatin, due to Transaminitis   Hypertension  - Home regimen on hold: losartan & metoprolol due to marginal BP at admission, consider restarting once patient has stabilized  Anxiety - xanax TID PRN - supportive care  Best practice (evaluated daily)  Diet: NPO Pain/Anxiety/Delirium protocol (if indicated): xanax PO VAP protocol (if indicated): N/A DVT prophylaxis: heparin drip GI prophylaxis: protonix IV Glucose control: monitor Q 4 Mobility: bedrest Disposition:ICU  Goals of Care:  Last date of multidisciplinary goals of care discussion:03/16/2020 Family and staff present: APP, care RN & patient Summary of discussion: plan of care, including upcoming diagnostic tests, anticoagulation, etc Follow up goals of care discussion due: 2/29/2022 Code Status: FULL  Labs   CBC: Recent Labs  Lab 03/16/20 0233  WBC 12.0*  NEUTROABS 9.7*  HGB 12.8  HCT 39.0  MCV 95.8  PLT 235    Basic Metabolic Panel: Recent Labs  Lab 03/16/20 0233  NA 136  K 4.3  CL 103  CO2 22  GLUCOSE 226*  BUN 27*  CREATININE 1.34*  CALCIUM 9.2   GFR: Estimated Creatinine Clearance: 47.8 mL/min (A) (by C-G formula based on SCr of 1.34 mg/dL (H)). Recent Labs  Lab 03/16/20 0233  WBC 12.0*    Liver Function Tests: No results for input(s): AST, ALT, ALKPHOS, BILITOT, PROT, ALBUMIN in the last 168 hours. No results for input(s): LIPASE, AMYLASE in the last 168 hours. No results for input(s): AMMONIA in the last 168 hours.  ABG No results found for: PHART, PCO2ART, PO2ART, HCO3, TCO2, ACIDBASEDEF, O2SAT   Coagulation Profile: No results for input(s): INR, PROTIME in the last 168  hours.  Cardiac Enzymes: No results for input(s): CKTOTAL, CKMB, CKMBINDEX, TROPONINI in the last 168 hours.  HbA1C: Hgb A1c MFr Bld  Date/Time Value Ref Range Status  10/17/2019 11:11 AM 5.7 4.6 - 6.5 % Final    Comment:    Glycemic Control Guidelines for People with Diabetes:Non Diabetic:  <6%Goal of Therapy: <7%Additional Action Suggested:  >8%   04/17/2019 02:19 PM 5.7 4.6 - 6.5 % Final    Comment:    Glycemic Control Guidelines for People with Diabetes:Non Diabetic:  <6%Goal of Therapy: <7%Additional Action Suggested:  >8%     CBG: No results for input(s): GLUCAP in the last 168 hours.  Review of Systems: Positives in BOLD  Gen: Denies fever, chills, weight change, fatigue, night sweats HEENT: Denies blurred vision, double vision, hearing loss, tinnitus, sinus congestion, rhinorrhea, sore throat, neck stiffness, dysphagia PULM: Denies shortness of breath, cough, sputum production, hemoptysis, wheezing CV: Denies chest pain, edema, orthopnea, paroxysmal nocturnal dyspnea, palpitations, dyspnea with exertion, near syncope GI: Denies abdominal pain, nausea, vomiting, diarrhea, hematochezia, melena, constipation, change in bowel habits GU: Denies dysuria, hematuria, polyuria, oliguria, urethral discharge Endocrine: Denies hot or cold intolerance, polyuria, polyphagia or appetite change Derm: Denies rash, dry skin, scaling or peeling skin change Heme: Denies easy bruising, bleeding, bleeding gums Neuro: Denies headache, numbness, weakness, slurred speech, loss of memory or consciousness Past Medical History:  She,  has a past medical history of Anxiety, Depression, Diverticulosis of colon, GERD (gastroesophageal reflux disease), History of colonoscopy, and Hypertension.   Surgical History:   Past Surgical History:  Procedure Laterality Date  . BREAST SURGERY     sterotactic breaswt biopsy: atypical cells,  continued diagnostic mammograms  . COLONOSCOPY    . COLONOSCOPY WITH  PROPOFOL N/A 02/21/2020   Procedure: COLONOSCOPY WITH PROPOFOL;  Surgeon: Virgel Manifold, MD;  Location: [facility] ENDOSCOPY;  Service: Endoscopy;  Laterality: N/A;     Social History:   reports that she has never smoked. She has never used smokeless tobacco. She reports current alcohol use of about 1.0 standard drink of alcohol per week. She reports that she does not use drugs.   Family History:  Her family history includes Cancer in her father, maternal uncle, and mother; Cancer (age of onset: 17) in her paternal grandmother; Heart disease (age of onset: 61) in her father; Stroke in her father.   Allergies Allergies  Allergen Reactions  . Clindamycin/Lincomycin Rash     Home Medications  Prior to Admission medications   Medication Sig Start Date End Date Taking? Authorizing Provider  acyclovir (ZOVIRAX) 400 MG tablet TAKE 1 TABLET (400 MG TOTAL) BY MOUTH EVERY 4 (  FOUR) HOURS WHILE AWAKE. 10/23/19   Crecencio Mc, MD  aspirin 81 MG tablet Take 81 mg by mouth daily.    [provider]  atorvastatin (LIPITOR) 20 MG tablet TAKE 1 TABLET BY MOUTH EVERY DAY 08/22/19   Crecencio Mc, MD  azithromycin (ZITHROMAX) 250 MG tablet 2 pills day 1 and 1 pill day 2-5 with food Patient not taking: Reported on 02/21/2020 11/07/19   McLean-Scocuzza, Nino Glow, MD  benzonatate (TESSALON) 200 MG capsule Take 1 capsule (200 mg total) by mouth 3 (three) times daily as needed for cough. 11/07/19   McLean-Scocuzza, Nino Glow, MD  buPROPion (WELLBUTRIN XL) 150 MG 24 hr tablet TAKE 1 TABLET BY MOUTH EVERY DAY 11/25/19   Crecencio Mc, MD  diclofenac Sodium (VOLTAREN) 1 % GEL diclofenac 1 % topical gel 12/08/17   [provider]  fish oil-omega-3 fatty acids 1000 MG capsule Take 2 g by mouth daily.    [provider]  ipratropium (ATROVENT) 0.06 % nasal spray Place 2 sprays into both nostrils 3 (three) times daily. 11/07/19   McLean-Scocuzza, Nino Glow, MD  losartan (COZAAR) 50 MG tablet TAKE  1 TABLET BY MOUTH EVERY DAY 10/10/19   Crecencio Mc, MD  meloxicam [facility]) 15 MG tablet  04/01/19   [provider]  metoprolol succinate (TOPROL-XL) 25 MG 24 hr tablet TAKE 1 TABLET BY MOUTH EVERY DAY 10/18/19   Crecencio Mc, MD  Multiple Vitamin (MULTIVITAMIN) tablet Take 1 tablet by mouth daily.    [provider]  PREMARIN vaginal cream USE 1 TO 2 GRAMS INTRAVAGINALLY TWICE WEEKLY 07/10/18   Crecencio Mc, MD  sodium chloride (OCEAN) 0.65 % SOLN nasal spray Place 1 spray into both nostrils as needed for congestion. 11/07/19   McLean-Scocuzza, Nino Glow, MD     Critical care time: 45 minutes       Venetia Night, AGACNP-BC Acute Care Nurse Practitioner [facility]   [phone] / [phone] Please see Amion for pager details.

## 2020-03-16 NOTE — ED Notes (Signed)
ED Provider at bedside. 

## 2020-03-16 NOTE — Progress Notes (Signed)
Pt A&OX4. Appears symmetrical. BP soft, 1L LR bolus administered. Zofran administered prn. Pt reports feeling SOB/anxious when repositioning, subsides with rest. Troponin trend up to 376, MD made aware.  Will continue to monitor.

## 2020-03-16 NOTE — ED Notes (Signed)
Patient remains out of room in imaging. Will obtain COVID swab when pt returns.

## 2020-03-16 NOTE — Progress Notes (Signed)
*  PRELIMINARY RESULTS* Echocardiogram 2D Echocardiogram has been performed.  Michele Garza 03/16/2020, 12:05 PM

## 2020-03-16 NOTE — ED Provider Notes (Signed)
[facility] Emergency Department Provider Note  ____________________________________________   Event Date/Time   First MD Initiated Contact with Patient 03/16/20 [phone]     (approximate)  I have reviewed the triage vital signs and the nursing notes.   HISTORY  Chief Complaint Near Syncope and Anxiety    HPI Michele Garza is a 70 y.o. female with medical history as listed below who notably has no history of lung disease and presents by EMS for evaluation of near syncope and feeling "odd" in general.  The symptoms started rather acutely about 24 hours ago.  She said that she has not felt quite right since yesterday "like I am going to jump out of my skin".  She calm down a little bit after lunch when she had lunch with a friend and thought that maybe she was just having some anxiety or nerves because she has a lot going on.  However tonight she became very lightheaded and thought she was going to pass out.  She laid down for a while but then continued to feel poorly and called EMS.   EMS noted that she had oxygen saturations of 88% on room air.  She was hyperventilating at the time but her oxygen level was low.  Upon arrival to the emergency department even after she calmed down and was no longer hyperventilating she continued to have oxygen saturation of about 80% on room air.  She denies chest pain.  She has had some shortness of breath but she associated it with anxiety.  She denies nausea, vomiting, abdominal pain, and dysuria.  She recently injured her left Achilles tendon and has had some swelling in her leg and decreased mobility as a result.  No recent surgeries or other immobilizations, just decreased mobility from the tendon injury.  No history of blood clots in the legs of the lungs.  She is fully vaccinated and boosted against COVID-19.  No recent fever, sore throat, or loss of smell or taste.  The symptoms were acute in onset and severe nothing in particular  made her feel better or worse.        Past Medical History:  Diagnosis Date  . Anxiety   . Depression   . Diverticulosis of colon    one prior episode July 2011 caused by tomato overingestion  . GERD (gastroesophageal reflux disease)   . History of colonoscopy    done in  dec 2011, repeat due 5 yrs due to [place]  . Hypertension     Patient Active Problem List   Diagnosis Date Noted  . Pulmonary emboli ([place]) 03/16/2020  . Family history of colon cancer   . Rectal nodule   . Polyp of sigmoid colon   . Morbid obesity ([place]) 01/06/2020  . Decreased calculated GFR 10/19/2019  . Osteoarthritis of knees, bilateral 07/08/2019  . Tendonitis, Achilles, right 07/08/2019  . Dystrophic nail 04/17/2019  . Osteoarthritis 11/05/2018  . Chronic pain of right knee 08/21/2018  . Constipation 07/14/2014  . Hyperlipidemia 03/03/2013  . Encounter for preventive health examination 03/01/2013  . Postmenopausal atrophic vaginitis 03/01/2013  . Plantar fasciitis, bilateral 06/11/2012  . History of herpes zoster 01/31/2012  . Morbid obesity with BMI of 40.0-44.9, adult ([place]) 01/31/2012  . Hypertension   . History of colonoscopy   . Diverticulosis of colon   . GERD (gastroesophageal reflux disease)   . Major depressive disorder in partial remission [facility])     Past Surgical History:  Procedure Laterality Date  .  BREAST SURGERY     sterotactic breaswt biopsy: atypical cells,  continued diagnostic mammograms  . COLONOSCOPY    . COLONOSCOPY WITH PROPOFOL N/A 02/21/2020   Procedure: COLONOSCOPY WITH PROPOFOL;  Surgeon: Virgel Manifold, MD;  Location: [facility] ENDOSCOPY;  Service: Endoscopy;  Laterality: N/A;    Prior to Admission medications   Medication Sig Start Date End Date Taking? Authorizing Provider  acyclovir (ZOVIRAX) 400 MG tablet TAKE 1 TABLET (400 MG TOTAL) BY MOUTH EVERY 4 (FOUR) HOURS WHILE AWAKE. 10/23/19  Yes Crecencio Mc, MD  aspirin 81 MG tablet Take 81 mg by mouth daily.   Yes  [provider]  atorvastatin (LIPITOR) 20 MG tablet TAKE 1 TABLET BY MOUTH EVERY DAY 08/22/19  Yes Crecencio Mc, MD  buPROPion (WELLBUTRIN XL) 150 MG 24 hr tablet TAKE 1 TABLET BY MOUTH EVERY DAY 11/25/19  Yes Crecencio Mc, MD  Cholecalciferol (VITAMIN D3 PO) Take by mouth every evening.   Yes [provider]  diclofenac Sodium (VOLTAREN) 1 % GEL Apply topically as needed. 12/08/17  Yes [provider]  esomeprazole (NEXIUM) 20 MG capsule Take 20 mg by mouth daily as needed.   Yes [provider]  fish oil-omega-3 fatty acids 1000 MG capsule Take 2 g by mouth every evening.   Yes [provider]  losartan (COZAAR) 50 MG tablet TAKE 1 TABLET BY MOUTH EVERY DAY 10/10/19  Yes Crecencio Mc, MD  metoprolol succinate (TOPROL-XL) 25 MG 24 hr tablet TAKE 1 TABLET BY MOUTH EVERY DAY 10/18/19  Yes Crecencio Mc, MD  Multiple Vitamin (MULTIVITAMIN) tablet Take 1 tablet by mouth every evening.   Yes [provider]  PREMARIN vaginal cream USE 1 TO 2 GRAMS INTRAVAGINALLY TWICE WEEKLY 07/10/18  Yes Crecencio Mc, MD  Turmeric (QC TUMERIC COMPLEX) 500 MG CAPS Take 500 mg by mouth every evening.   Yes [provider]  azithromycin (ZITHROMAX) 250 MG tablet 2 pills day 1 and 1 pill day 2-5 with food Patient not taking: No sig reported 11/07/19   McLean-Scocuzza, Nino Glow, MD  benzonatate (TESSALON) 200 MG capsule Take 1 capsule (200 mg total) by mouth 3 (three) times daily as needed for cough. Patient not taking: Reported on 03/16/2020 11/07/19   McLean-Scocuzza, Nino Glow, MD  ipratropium (ATROVENT) 0.06 % nasal spray Place 2 sprays into both nostrils 3 (three) times daily. Patient not taking: Reported on 03/16/2020 11/07/19   McLean-Scocuzza, Nino Glow, MD  meloxicam [facility]) 15 MG tablet  04/01/19   [provider]  sodium chloride (OCEAN) 0.65 % SOLN nasal spray Place 1 spray into both nostrils as needed for congestion. Patient not taking:  Reported on 03/16/2020 11/07/19   McLean-Scocuzza, Nino Glow, MD    Allergies Clindamycin/lincomycin  Family History  Problem Relation Age of Onset  . Cancer Mother        bilatera, contralateral recurred after 30 yrs  . Stroke Father   . Heart disease Father 67       died of stroke  at 23, tobacco user  . Cancer Father        colon Ca  . Cancer Paternal Grandmother 37       metastatic colon Ca  . Cancer Maternal Uncle        breast x 2 aunts    Social History Social History   Tobacco Use  . Smoking status: Never Smoker  . Smokeless tobacco: Never Used  Vaping Use  . Vaping  Use: Never used  Substance Use Topics  . Alcohol use: Yes    Alcohol/week: 1.0 standard drink    Types: 1 Shots of liquor per week    Comment: occasional  . Drug use: No    Review of Systems Constitutional: No fever/chills Eyes: No visual changes. ENT: No sore throat. Cardiovascular: Denies chest pain. Respiratory: Denies shortness of breath. Gastrointestinal: No abdominal pain.  No nausea, no vomiting.  No diarrhea.  No constipation. Genitourinary: Negative for dysuria. Musculoskeletal: Negative for neck pain.  Negative for back pain. Integumentary: Negative for rash. Neurological: Negative for headaches, focal weakness or numbness.   ____________________________________________   PHYSICAL EXAM:  VITAL SIGNS: ED Triage Vitals  Enc Vitals Group     BP 03/16/20 0221 (!) 104/56     Pulse Rate 03/16/20 0221 (!) 109     Resp 03/16/20 0221 18     Temp 03/16/20 0221 98 F (36.7 C)     Temp Source 03/16/20 0221 Oral     SpO2 03/16/20 0221 (!) 88 %     Weight 03/16/20 0224 110 kg (242 lb 8.1 oz)     Height 03/16/20 0224 1.6 m (5\' 3" )     Head Circumference --      Peak Flow --      Pain Score 03/16/20 0224 0     Pain Loc --      Pain Edu? --      Excl. in [place]? --     Constitutional: Alert and oriented.  Eyes: Conjunctivae are normal.  Head: Atraumatic. Nose: No  congestion/rhinnorhea. Mouth/Throat: Patient is wearing a mask. Neck: No stridor.  No meningeal signs.   Cardiovascular: Tachycardia, regular rhythm. Good peripheral circulation. Respiratory: Normal respiratory effort.  No retractions.  No wheezes, rales, nor rhonchi. Gastrointestinal: Obese.  Soft and nontender. No distention.  Musculoskeletal: 1+ pitting edema bilaterally with increased swelling in the left lower extremity with some bruising around her posterior ankle consistent with her report of a history of Achilles tendon injury. Neurologic:  Normal speech and language. No gross focal neurologic deficits are appreciated.  Skin:  Skin is warm, dry and intact. Psychiatric: Mood and affect are normal. Speech and behavior are normal.  ____________________________________________   LABS (all labs ordered are listed, but only abnormal results are displayed)  Labs Reviewed  CBC WITH DIFFERENTIAL/PLATELET - Abnormal; Notable for the following components:      Result Value   WBC 12.0 (*)    Neutro Abs 9.7 (*)    All other components within normal limits  BASIC METABOLIC PANEL - Abnormal; Notable for the following components:   Glucose, Bld 226 (*)    BUN 27 (*)    Creatinine, Ser 1.34 (*)    GFR, Estimated 43 (*)    All other components within normal limits  HEPATIC FUNCTION PANEL - Abnormal; Notable for the following components:   AST 71 (*)    ALT 51 (*)    All other components within normal limits  BRAIN NATRIURETIC PEPTIDE - Abnormal; Notable for the following components:   B Natriuretic Peptide 477.1 (*)    All other components within normal limits  TROPONIN I (HIGH SENSITIVITY) - Abnormal; Notable for the following components:   Troponin I (High Sensitivity) 215 (*)    All other components within normal limits  RESP PANEL BY RT-PCR (FLU A&B, COVID) ARPGX2  MRSA PCR SCREENING  LIPASE, BLOOD  PROTIME-INR  APTT  HIV ANTIBODY (ROUTINE TESTING W REFLEX)  ANTITHROMBIN III   PROTEIN C ACTIVITY  PROTEIN C, TOTAL  PROTEIN S ACTIVITY  PROTEIN S, TOTAL  LUPUS ANTICOAGULANT PANEL  BETA-2-GLYCOPROTEIN I ABS, IGG/M/A  HOMOCYSTEINE  FACTOR 5 LEIDEN  PROTHROMBIN GENE MUTATION  CARDIOLIPIN ANTIBODIES, IGG, IGM, IGA  THYROID PANEL WITH TSH  HEPARIN LEVEL (UNFRACTIONATED)  TROPONIN I (HIGH SENSITIVITY)  TROPONIN I (HIGH SENSITIVITY)   ____________________________________________  EKG  ED ECG REPORT I, Hinda Kehr, the attending physician, personally viewed and interpreted this ECG.  Date: 03/16/2020 EKG Time: 2:18 AM Rate: 109 Rhythm: Sinus tachycardia QRS Axis: normal Intervals: normal ST/T Wave abnormalities: Non-specific ST segment / T-wave changes, but no clear evidence of acute ischemia. Narrative Interpretation: no definitive evidence of acute ischemia; does not meet STEMI criteria.   ____________________________________________  RADIOLOGY I, Hinda Kehr, personally viewed and evaluated these images (plain radiographs) as part of my medical decision making, as well as reviewing the written report by the radiologist.  I also discussed the case by phone with the radiologist.  ED MD interpretation: Submassive PEs bilaterally with right heart strain.  Concern for liver abnormalities and thyroid tumor.  Official radiology report(s): CT Angio Chest PE W/Cm &/Or Wo Cm  Result Date: 03/16/2020 CLINICAL DATA:  70 year old female with syncopal episode while using the bathroom. EXAM: CT ANGIOGRAPHY CHEST WITH CONTRAST TECHNIQUE: Multidetector CT imaging of the chest was performed using the standard protocol during bolus administration of intravenous contrast. Multiplanar CT image reconstructions and MIPs were obtained to evaluate the vascular anatomy. CONTRAST:  75mL OMNIPAQUE IOHEXOL 350 MG/ML SOLN COMPARISON:  None. FINDINGS: Cardiovascular: Excellent contrast bolus timing in the pulmonary arterial tree. Positive bilateral distal main pulmonary artery and  hilar pulmonary embolus (series 5, images 95, 109. Clot extends into all lobar branches. There is no saddle embolus. Associated abnormal RV/LV ratio = greater than 1.5. No pericardial effusion. Little contrast in the aorta. Calcified coronary artery atherosclerosis. Mediastinum/Nodes: Thyroid goiter with retrosternal extension of partially calcified bulky left lobe. No mediastinal lymphadenopathy. Lungs/Pleura: Trace pleural fluid. Dependent ground-glass opacity in both lungs but no confluent pulmonary infarct or consolidation. Moderate narrowing of the trachea at the thoracic inlet related to bulky left thyroid goiter (series 6, image 12). Otherwise the major airways are patent. Upper Abdomen: Suggestion of nonspecific inflammation at the porta hepatis in the visible upper abdomen. Small volume perihepatic ascites with simple fluid density. The spleen is not enlarged. Gallbladder might be indistinct, uncertain. Visible pancreas, stomach and kidneys remain within normal limits. There is large bowel diverticulosis at the splenic flexure. Musculoskeletal: Mild probably chronic T4, T5 and T7 mild superior endplate compression. Degenerative appearing Schmorl's nodes occasionally in the lower thoracic spine. No suspicious osseous lesion. Review of the MIP images confirms the above findings. IMPRESSION: 1. Positive for acute pulmonary embolus with clot in both main PAs and lobar arteries. CT evidence of right heart strain (abnormal RV/LV Ratio suspicious for at least submassive (intermediate risk) PE. The presence of right heart strain has been associated with an increased risk of morbidity and mortality. 2. Trace pleural fluid and mild ground-glass opacity. No discrete pulmonary infarct at this time. 3. Evidence also of nonspecific inflammation at the [facility] with small volume Perihepatic Ascites. Questionable inflammation of the gallbladder. Follow-up CT Abdomen and Pelvis (IV contrast preferred) recommended when  feasible. 4. Bulky thyroid goiter with retrosternal extension and moderate compression of the trachea at the thoracic inlet. 5. Calcified coronary artery atherosclerosis. Aortic Atherosclerosis (ICD10-I70.0). Critical Value/emergent results were called by telephone  at the time of interpretation on 03/16/2020 at 4:41 am to Dr. Hinda Kehr , who verbally acknowledged these results. Electronically Signed   By: Genevie Ann M.D.   On: 03/16/2020 04:45    ____________________________________________   PROCEDURES   Procedure(s) performed (including Critical Care):  .1-3 Lead EKG Interpretation Performed by: Hinda Kehr, MD Authorized by: Hinda Kehr, MD     Interpretation: abnormal     ECG rate:  111   ECG rate assessment: tachycardic     Rhythm: sinus tachycardia     Ectopy: none     Conduction: normal    .Critical Care Performed by: Hinda Kehr, MD Authorized by: Hinda Kehr, MD   Critical care provider statement:    Critical care time (minutes):  30   Critical care time was exclusive of:  Separately billable procedures and treating other patients   Critical care was necessary to treat or prevent imminent or life-threatening deterioration of the following conditions:  Respiratory failure and cardiac failure   Critical care was time spent personally by me on the following activities:  Development of treatment plan with patient or surrogate, discussions with consultants, evaluation of patient's response to treatment, examination of patient, obtaining history from patient or surrogate, ordering and performing treatments and interventions, ordering and review of laboratory studies, ordering and review of radiographic studies, pulse oximetry, re-evaluation of patient's condition and review of old charts     ____________________________________________   [place] / MDM / [place] / ED COURSE  As part of my medical decision making, I reviewed the following data  within the [place] notes reviewed and incorporated, Labs reviewed , EKG interpreted , Old chart reviewed, Discussed with admitting physician , Discussed with radiologist and Notes from prior ED visits   Differential diagnosis includes, but is not limited to, PE, dehydration or nonspecific volume depletion, ACS, COVID-19, pneumonia, metabolic or electrolyte abnormality, sepsis.  The patient is on the cardiac monitor to evaluate for evidence of arrhythmia and/or significant heart rate changes.  The patient has no infectious signs or symptoms.  Although she is tachycardic I think that this is due either to anxiety or possibly pulmonary embolism.  She has hypoxemia on room air and no history of lung disease.  Given the combination of a near syncopal episode and feeling "not quite right", tachycardia, and hypoxemia as well as some subjective shortness of breath which she attributes to anxiety, I will proceed with CTA chest to rule out pulmonary embolism.  She also has the added risk factor of her recent orthopedic injury in her left leg which is led to decreased mobility.  Patient is requiring 4 L of oxygen by nasal cannula to remain in the low 90s although sometimes she will come up to 98% but most of the time she is satting around 93%.  No sign of ischemia on EKG at this time.  Patient metabolic panel is essentially normal although her creatinine and BUN are slightly elevated from baseline, likely suggestive of mild volume depletion and the patient reports she has not been eating and drinking as well as usual.  CBC is mostly normal although she has a mild leukocytosis of 12.  I will reassess after CTA chest and also ordered a viral respiratory panel.     Clinical Course as of 03/16/20 1324  Mon Mar 16, 2020  4010 CT Angio Chest PE W/Cm &/Or Wo Cm Dr. [place] Crane the radiologist called me to discuss the results.  The patient has submassive PEs with right heart strain.  She also has  some abnormalities around the liver that eventually need further assessment with CT abdomen and pelvis with IV contrast.  Additionally she has a thyroid mass that is putting pressure on the trachea and Dr. [place] Crane feels that it may lead to difficult intubation if that becomes necessary.  I updated the patient and she had an episode where she got up to go to the bathroom and immediately felt dizzy like she was going to pass out.  Her oxygen saturation dropped down into the 80s and she was on a nonrebreather when I went into the room.  She calm down and her oxygen saturation came back up to the upper 90s on the nonrebreather.  I updated her about the critical results and the need to stay in the hospital.  I will discussed the case with the ICU and verify that she is appropriate to keep at our hospital, but there is not a massive saddle embolus amenable to IR retrieval.  Ordering heparin per protocol for PE.  [CF]  0503 Discussed case by phone with Domingo Pulse [place], ICU NP, who will admit the patient.  She agrees that there is no clear benefit to transfer given that there is no massive saddle embolus for clot retrieval. [CF]  0521 The patient's troponin is 215, likely demand ischemia in the setting of right heart strain and extensive pulmonary embolism clot burden. [CF]    Clinical Course User Index [CF] Hinda Kehr, MD     ____________________________________________  FINAL CLINICAL IMPRESSION(S) / ED DIAGNOSES  Final diagnoses:  Acute pulmonary embolism with acute cor pulmonale, unspecified pulmonary embolism type ([place])  Acute respiratory failure with hypoxemia ([place])  Thyroid mass  Elevated troponin level  Demand ischemia ([place])     MEDICATIONS GIVEN DURING THIS VISIT:  Medications  docusate sodium (COLACE) capsule 100 mg (has no administration in time range)  polyethylene glycol (MIRALAX / GLYCOLAX) packet 17 g (has no administration in time range)  ondansetron (ZOFRAN) injection  4 mg (4 mg Intravenous Given 03/16/20 0518)  pantoprazole (PROTONIX) injection 40 mg (has no administration in time range)  heparin bolus via infusion 5,000 Units (5,000 Units Intravenous Bolus from Bag 03/16/20 0514)    Followed by  heparin ADULT infusion 100 units/mL (25000 units/262mL) (1,350 Units/hr Intravenous Infusion Verify 03/16/20 0744)  Chlorhexidine Gluconate Cloth 2 % PADS 6 each (has no administration in time range)  lactated ringers infusion ( Intravenous Infusion Verify 03/16/20 0744)  ALPRAZolam Duanne Moron) tablet 0.25 mg (0.25 mg Oral Given 03/16/20 5027)  sodium chloride 0.9 % bolus 500 mL (0 mLs Intravenous Stopped 03/16/20 0402)  iohexol (OMNIPAQUE) 350 MG/ML injection 75 mL (75 mLs Intravenous Contrast Given 03/16/20 0413)  sodium chloride 0.9 % bolus 1,000 mL (1,000 mLs Intravenous Bolus from Bag 03/16/20 0547)     ED Discharge Orders    None      *Please note:  Michele Garza was evaluated in Emergency Department on 03/16/2020 for the symptoms described in the history of present illness. She was evaluated in the context of the global COVID-19 pandemic, which necessitated consideration that the patient might be at risk for infection with the SARS-CoV-2 virus that causes COVID-19. Institutional protocols and algorithms that pertain to the evaluation of patients at risk for COVID-19 are in a state of rapid change based on information released by regulatory bodies including the CDC and federal and state organizations. These policies and algorithms were followed  during the patient's care in the ED.  Some ED evaluations and interventions may be delayed as a result of limited staffing during and after the pandemic.*  Note:  This document was prepared using Dragon voice recognition software and may include unintentional dictation errors.   Hinda Kehr, MD 03/16/20 [phone]

## 2020-03-16 NOTE — ED Notes (Signed)
Patient blood pressure 80s/60s upon departure from floor. ED provider notified and verbal orders received to start NS bolus and transport patient to floor. Patient on 15L via non-rebreather. Improved work of breathing noted but patient still reports anxiety. All belongings with patient upon transport to ICU.

## 2020-03-16 NOTE — ED Triage Notes (Addendum)
Pt is a 70 y/o f coming from home who presents via EMS with a cc of near syncope. Per MEDIC, patient was attempting to use the restroom when she had a near syncopal episode. MEDIC reports patient had another near syncopal episode with them for 2-3 minutes. MEDIC states patient appeared to be having a panic attack and was hyperventilating. Placed on 02 for sats 88% on RA. Pt reports associated nausea, dizziness, and anxiety. Reports an increase in stress at home recently. Pt denies associated shortness of breath and chest pain.BGL 408 with medic. NSR. VSWNL. Patient states she is "embarrassed to be here because I normally deal with the stress on my own".

## 2020-03-16 NOTE — Progress Notes (Signed)
[place] for Heparin Infusion Indication: pulmonary embolus  Allergies  Allergen Reactions  . Clindamycin/Lincomycin Rash    Patient Measurements: Height: 5\' 3"  (160 cm) Weight: 110 kg (242 lb 8.1 oz) IBW/kg (Calculated) : 52.4 Heparin Dosing Weight: 78.9kg  Vital Signs: Temp: 98 F (36.7 C) (02/28 0221) Temp Source: Oral (02/28 0221) BP: 110/87 (02/28 0400) Pulse Rate: 101 (02/28 0400)  Labs: Recent Labs    03/16/20 0233  HGB 12.8  HCT 39.0  PLT 178  CREATININE 1.34*    Estimated Creatinine Clearance: 47.8 mL/min (A) (by C-G formula based on SCr of 1.34 mg/dL (H)).   Medical History: Past Medical History:  Diagnosis Date  . Anxiety   . Depression   . Diverticulosis of colon    one prior episode July 2011 caused by tomato overingestion  . GERD (gastroesophageal reflux disease)   . History of colonoscopy    done in  dec 2011, repeat due 5 yrs due to [place]  . Hypertension     Assessment: Pt is 70 yo female after near syncopal event with CT angio of chest was positive for pulmonary emboli with clot in both main pulmonary arteries & lobar arteries.  Goal of Therapy:  Heparin level 0.3-0.7 units/ml Monitor platelets by anticoagulation protocol: Yes   Plan:  Give 5000 units bolus x 1 Start heparin infusion at 1350 units/hr Check anti-Xa level in 6 hours and daily while on heparin Continue to monitor H&H and platelets  Renda Rolls, PharmD, [facility] 03/16/2020 5:10 AM

## 2020-03-16 NOTE — Progress Notes (Signed)
[facility] Vascular Consult Note  MRN : [id number]  Michele Garza is a 70 y.o. ([birth year]) female who presents with chief complaint of pulmonary embolism.  History of Present Illness:  70 year old female presenting to the ED via EMS from home after a near syncopal event.    She reports feeling "odd" similar to times when she has had anxiety in the past.  She reported not feeling quite right since Saturday, 03/14/2020, but thought it was anxiety/nerves as she has a lot going on at the moment.  However on Sunday night to 02/24/20 she was attempting to walk her dog and felt as though she was going to pass out on the elevator.  She laid down on the floor of the elevator, holding the door open until a neighbor passed by and called EMS.  EMS stated her SPO2 was 88% on room air and that she was hyperventilating.   While in the emergency department the patient's oxygen requirements increased to a non-rebreather.  When she attempted to get up she felt lightheaded and became hypoxic.   CTA chest: Positive for acute pulmonary embolus with clot and in both main stem pulmonary artery and lobar arteries.  CT evidence of right heart strain.  Venous duplex: No DVT found  Vascular surgery was consulted by Dr. Mortimer Fries for possible endovascular intervention.  No current facility-administered medications for this visit.   No current outpatient medications on file.   Facility-Administered Medications Ordered in Other Visits  Medication Dose Route Frequency Provider Last Rate Last Admin  . ALPRAZolam Duanne Moron) tablet 0.25 mg  0.25 mg Oral TID PRN Rust-Chester, Toribio Harbour L, NP   0.25 mg at 03/16/20 6712  . Chlorhexidine Gluconate Cloth 2 % PADS 6 each  6 each Topical Daily Kasa, Kurian, MD      . docusate sodium (COLACE) capsule 100 mg  100 mg Oral BID PRN Rust-Chester, Britton L, NP      . heparin ADULT infusion 100 units/mL (25000 units/222mL)  1,100 Units/hr Intravenous Continuous [name], MD 11 mL/hr at 03/16/20 1414 1,100 Units/hr at 03/16/20 1414  . lactated ringers infusion   Intravenous Continuous Rust-Chester, Huel Cote, NP   Paused at 03/16/20 1017  . ondansetron (ZOFRAN) injection 4 mg  4 mg Intravenous Q6H PRN Rust-Chester, Britton L, NP   4 mg at 03/16/20 1126  . pantoprazole (PROTONIX) injection 40 mg  40 mg Intravenous QHS Rust-Chester, Britton L, NP      . polyethylene glycol (MIRALAX / GLYCOLAX) packet 17 g  17 g Oral Daily PRN Rust-Chester, Huel Cote, NP       Past Medical History:  Diagnosis Date  . Anxiety   . Depression   . Diverticulosis of colon    one prior episode July 2011 caused by tomato overingestion  . GERD (gastroesophageal reflux disease)   . History of colonoscopy    done in  dec 2011, repeat due 5 yrs due to [place]  . Hypertension    Past Surgical History:  Procedure Laterality Date  . BREAST SURGERY     sterotactic breaswt biopsy: atypical cells,  continued diagnostic mammograms  . COLONOSCOPY    . COLONOSCOPY WITH PROPOFOL N/A 02/21/2020   Procedure: COLONOSCOPY WITH PROPOFOL;  Surgeon: Virgel Manifold, MD;  Location: [facility] ENDOSCOPY;  Service: Endoscopy;  Laterality: N/A;   Social History Social History   Tobacco Use  . Smoking status: Never Smoker  . Smokeless tobacco: Never Used  Vaping Use  . Vaping  Use: Never used  Substance Use Topics  . Alcohol use: Yes    Alcohol/week: 1.0 standard drink    Types: 1 Shots of liquor per week    Comment: occasional  . Drug use: No   Family History Family History  Problem Relation Age of Onset  . Cancer Mother        bilatera, contralateral recurred after 30 yrs  . Stroke Father   . Heart disease Father 76       died of stroke  at 7, tobacco user  . Cancer Father        colon Ca  . Cancer Paternal Grandmother 31       metastatic colon Ca  . Cancer Maternal Uncle        breast x 2 aunts  Denies family history of peripheral artery disease, venous disease or renal  disease.  Allergies  Allergen Reactions  . Clindamycin/Lincomycin Rash   REVIEW OF SYSTEMS (Negative unless checked)  Constitutional: [] Weight loss  [] Fever  [] Chills Cardiac: [] Chest pain   [] Chest pressure   [] Palpitations   [] Shortness of breath when laying flat   [] Shortness of breath at rest   [x] Shortness of breath with exertion. Vascular:  [] Pain in legs with walking   [] Pain in legs at rest   [] Pain in legs when laying flat   [] Claudication   [] Pain in feet when walking  [] Pain in feet at rest  [] Pain in feet when laying flat   [] History of DVT   [] Phlebitis   [x] Swelling in legs   [] Varicose veins   [] Non-healing ulcers Pulmonary:   [] Uses home oxygen   [] Productive cough   [] Hemoptysis   [] Wheeze  [] COPD   [] Asthma Neurologic:  [] Dizziness  [] Blackouts   [] Seizures   [] History of stroke   [] History of TIA  [] Aphasia   [] Temporary blindness   [] Dysphagia   [] Weakness or numbness in arms   [] Weakness or numbness in legs Musculoskeletal:  [] Arthritis   [] Joint swelling   [] Joint pain   [] Low back pain Hematologic:  [] Easy bruising  [] Easy bleeding   [] Hypercoagulable state   [] Anemic  [] Hepatitis Gastrointestinal:  [] Blood in stool   [] Vomiting blood  [] Gastroesophageal reflux/heartburn   [] Difficulty swallowing. Genitourinary:  [] Chronic kidney disease   [] Difficult urination  [] Frequent urination  [] Burning with urination   [] Blood in urine Skin:  [] Rashes   [] Ulcers   [] Wounds Psychological:  [x] History of anxiety   []  History of major depression.  Physical Examination  There were no vitals filed for this visit. There is no height or weight on file to calculate BMI. Gen:  WD/WN, NAD Head: [place]/AT, No temporalis wasting. Prominent temp pulse not noted. Ear/Nose/Throat: Hearing grossly intact, nares w/o erythema or drainage, oropharynx w/o Erythema/Exudate Eyes: Sclera non-icteric, conjunctiva clear Neck: Trachea midline.  No JVD.  Pulmonary: On nasal cannula.  Good air movement,  respirations not labored, equal bilaterally.  Cardiac: RRR, normal S1, S2. Vascular:  Vessel Right Left  Radial Palpable Palpable  Ulnar Palpable Palpable  Brachial Palpable Palpable  Carotid Palpable, without bruit Palpable, without bruit  Aorta Not palpable N/A  Femoral Palpable Palpable  Popliteal Palpable Palpable  PT Palpable Palpable  DP Palpable Palpable   Gastrointestinal: soft, non-tender/non-distended. No guarding/reflex.  Musculoskeletal: M/S 5/5 throughout.  Extremities without ischemic changes.  No deformity or atrophy. No edema. Neurologic: Sensation grossly intact in extremities.  Symmetrical.  Speech is fluent. Motor exam as listed above. Psychiatric: Judgment intact, Mood & affect  appropriate for pt's clinical situation. Dermatologic: No rashes or ulcers noted.  No cellulitis or open wounds. Lymph : No Cervical, Axillary, or Inguinal lymphadenopathy.  CBC Lab Results  Component Value Date   WBC 12.0 (H) 03/16/2020   HGB 12.8 03/16/2020   HCT 39.0 03/16/2020   MCV 95.8 03/16/2020   PLT 178 03/16/2020   BMET    Component Value Date/Time   NA 136 03/16/2020 0233   K 4.3 03/16/2020 0233   CL 103 03/16/2020 0233   CO2 22 03/16/2020 0233   GLUCOSE 226 (H) 03/16/2020 0233   BUN 27 (H) 03/16/2020 0233   CREATININE 1.34 (H) 03/16/2020 0233   CREATININE 0.91 09/07/2018 1641   CALCIUM 9.2 03/16/2020 0233   GFRNONAA 43 (L) 03/16/2020 0233   Estimated Creatinine Clearance: 47.5 mL/min (A) (by C-G formula based on SCr of 1.34 mg/dL (H)).  COAG Lab Results  Component Value Date   INR 1.1 03/16/2020   Radiology CT Angio Chest PE W/Cm &/Or Wo Cm  Result Date: 03/16/2020 CLINICAL DATA:  70 year old female with syncopal episode while using the bathroom. EXAM: CT ANGIOGRAPHY CHEST WITH CONTRAST TECHNIQUE: Multidetector CT imaging of the chest was performed using the standard protocol during bolus administration of intravenous contrast. Multiplanar CT image  reconstructions and MIPs were obtained to evaluate the vascular anatomy. CONTRAST:  43mL OMNIPAQUE IOHEXOL 350 MG/ML SOLN COMPARISON:  None. FINDINGS: Cardiovascular: Excellent contrast bolus timing in the pulmonary arterial tree. Positive bilateral distal main pulmonary artery and hilar pulmonary embolus (series 5, images 95, 109. Clot extends into all lobar branches. There is no saddle embolus. Associated abnormal RV/LV ratio = greater than 1.5. No pericardial effusion. Little contrast in the aorta. Calcified coronary artery atherosclerosis. Mediastinum/Nodes: Thyroid goiter with retrosternal extension of partially calcified bulky left lobe. No mediastinal lymphadenopathy. Lungs/Pleura: Trace pleural fluid. Dependent ground-glass opacity in both lungs but no confluent pulmonary infarct or consolidation. Moderate narrowing of the trachea at the thoracic inlet related to bulky left thyroid goiter (series 6, image 12). Otherwise the major airways are patent. Upper Abdomen: Suggestion of nonspecific inflammation at the porta hepatis in the visible upper abdomen. Small volume perihepatic ascites with simple fluid density. The spleen is not enlarged. Gallbladder might be indistinct, uncertain. Visible pancreas, stomach and kidneys remain within normal limits. There is large bowel diverticulosis at the splenic flexure. Musculoskeletal: Mild probably chronic T4, T5 and T7 mild superior endplate compression. Degenerative appearing Schmorl's nodes occasionally in the lower thoracic spine. No suspicious osseous lesion. Review of the MIP images confirms the above findings. IMPRESSION: 1. Positive for acute pulmonary embolus with clot in both main PAs and lobar arteries. CT evidence of right heart strain (abnormal RV/LV Ratio suspicious for at least submassive (intermediate risk) PE. The presence of right heart strain has been associated with an increased risk of morbidity and mortality. 2. Trace pleural fluid and mild  ground-glass opacity. No discrete pulmonary infarct at this time. 3. Evidence also of nonspecific inflammation at the [facility] with small volume Perihepatic Ascites. Questionable inflammation of the gallbladder. Follow-up CT Abdomen and Pelvis (IV contrast preferred) recommended when feasible. 4. Bulky thyroid goiter with retrosternal extension and moderate compression of the trachea at the thoracic inlet. 5. Calcified coronary artery atherosclerosis. Aortic Atherosclerosis (ICD10-I70.0). Critical Value/emergent results were called by telephone at the time of interpretation on 03/16/2020 at 4:41 am to Dr. Hinda Kehr , who verbally acknowledged these results. Electronically Signed   By: Herminio Heads.D.  On: 03/16/2020 04:45   US Venous Img Lower Bilateral (DVT)  Result Date: 03/16/2020 CLINICAL DATA:  History of pulmonary embolism.  Evaluate for DVT. EXAM: BILATERAL LOWER EXTREMITY VENOUS DOPPLER ULTRASOUND TECHNIQUE: Gray-scale sonography with graded compression, as well as color Doppler and duplex ultrasound were performed to evaluate the lower extremity deep venous systems from the level of the common femoral vein and including the common femoral, femoral, profunda femoral, popliteal and calf veins including the posterior tibial, peroneal and gastrocnemius veins when visible. The superficial great saphenous vein was also interrogated. Spectral Doppler was utilized to evaluate flow at rest and with distal augmentation maneuvers in the common femoral, femoral and popliteal veins. COMPARISON:  Chest CTA-earlier same day FINDINGS: RIGHT LOWER EXTREMITY Common Femoral Vein: No evidence of thrombus. Normal compressibility, respiratory phasicity and response to augmentation. Saphenofemoral Junction: No evidence of thrombus. Normal compressibility and flow on color Doppler imaging. Profunda Femoral Vein: No evidence of thrombus. Normal compressibility and flow on color Doppler imaging. Femoral Vein: No evidence of  thrombus. Normal compressibility, respiratory phasicity and response to augmentation. Popliteal Vein: No evidence of thrombus. Normal compressibility, respiratory phasicity and response to augmentation. Calf Veins: No evidence of thrombus. Normal compressibility and flow on color Doppler imaging. Superficial Great Saphenous Vein: No evidence of thrombus. Normal compressibility. Venous Reflux:  None. Other Findings:  None. LEFT LOWER EXTREMITY Common Femoral Vein: No evidence of thrombus. Normal compressibility, respiratory phasicity and response to augmentation. Saphenofemoral Junction: No evidence of thrombus. Normal compressibility and flow on color Doppler imaging. Profunda Femoral Vein: No evidence of thrombus. Normal compressibility and flow on color Doppler imaging. Femoral Vein: No evidence of thrombus. Normal compressibility, respiratory phasicity and response to augmentation. Popliteal Vein: No evidence of thrombus. Normal compressibility, respiratory phasicity and response to augmentation. Calf Veins: Appear patent where visualized. Superficial Great Saphenous Vein: No evidence of thrombus. Normal compressibility. Venous Reflux:  None. Other Findings:  None. IMPRESSION: No evidence of DVT within either lower extremity. Electronically Signed   By: Sandi Mariscal M.D.   On: 03/16/2020 09:37   ECHOCARDIOGRAM COMPLETE  Result Date: 03/16/2020    ECHOCARDIOGRAM REPORT   Patient Name:   RADIE BERGES Date of Exam: 03/16/2020 Medical Rec #:  [id number]      Height:       63.0 in Accession #:    [id number]     Weight:       239.6 lb Date of Birth:  October 18, 1950      BSA:          2.088 m Patient Age:    21 years       BP:           92/66 mmHg Patient Gender: F              HR:           109 bpm. Exam Location:  [facility] Procedure: 2D Echo, Color Doppler, Cardiac Doppler and Intracardiac            Opacification Agent Indications:     I26.09 Pulmonary Embolus  History:         Patient has no prior history of  Echocardiogram examinations.                  Risk Factors:Hypertension.  Sonographer:     Charmayne Sheer RDCS (AE) Referring Phys:  [id number] BRITTON L RUST-CHESTER Diagnosing Phys: Kathlyn Sacramento MD  Sonographer Comments: Technically difficult study due to poor echo windows. TDS  due to pt tachycardia throughout study. IMPRESSIONS  1. Left ventricular ejection fraction, by estimation, is 55 to 60%. The left ventricle has normal function. The left ventricle has no regional wall motion abnormalities. Left ventricular diastolic parameters are consistent with Grade I diastolic dysfunction (impaired relaxation).  2. Right ventricular systolic function is mildly reduced. The right ventricular size is moderately enlarged. There is moderately elevated pulmonary artery systolic pressure. The estimated right ventricular systolic pressure is 86.7 mmHg.  3. The mitral valve is normal in structure. No evidence of mitral valve regurgitation. No evidence of mitral stenosis.  4. The aortic valve is normal in structure. Aortic valve regurgitation is not visualized. No aortic stenosis is present.  5. The inferior vena cava is dilated in size with <50% respiratory variability, suggesting right atrial pressure of 15 mmHg. FINDINGS  Left Ventricle: Left ventricular ejection fraction, by estimation, is 55 to 60%. The left ventricle has normal function. The left ventricle has no regional wall motion abnormalities. Definity contrast agent was given IV to delineate the left ventricular  endocardial borders. The left ventricular internal cavity size was normal in size. There is no left ventricular hypertrophy. Left ventricular diastolic parameters are consistent with Grade I diastolic dysfunction (impaired relaxation). Right Ventricle: The right ventricular size is moderately enlarged. No increase in right ventricular wall thickness. Right ventricular systolic function is mildly reduced. There is moderately elevated pulmonary artery systolic  pressure. The tricuspid regurgitant velocity is 3.00 m/s, and with an assumed right atrial pressure of 15 mmHg, the estimated right ventricular systolic pressure is 67.2 mmHg. Left Atrium: Left atrial size was normal in size. Right Atrium: Right atrial size was normal in size. Pericardium: There is no evidence of pericardial effusion. Mitral Valve: The mitral valve is normal in structure. No evidence of mitral valve regurgitation. No evidence of mitral valve stenosis. MV peak gradient, 4.2 mmHg. The mean mitral valve gradient is 1.0 mmHg. Tricuspid Valve: The tricuspid valve is normal in structure. Tricuspid valve regurgitation is mild . No evidence of tricuspid stenosis. Aortic Valve: The aortic valve is normal in structure. Aortic valve regurgitation is not visualized. No aortic stenosis is present. Aortic valve mean gradient measures 2.0 mmHg. Aortic valve peak gradient measures 4.6 mmHg. Aortic valve area, by VTI measures 2.25 cm. Pulmonic Valve: The pulmonic valve was normal in structure. Pulmonic valve regurgitation is not visualized. No evidence of pulmonic stenosis. Aorta: The aortic root is normal in size and structure. Venous: The inferior vena cava is dilated in size with less than 50% respiratory variability, suggesting right atrial pressure of 15 mmHg. IAS/Shunts: No atrial level shunt detected by color flow Doppler.  LEFT VENTRICLE PLAX 2D LVIDd:         3.20 cm  Diastology LVIDs:         2.10 cm  LV e' medial:    4.46 cm/s LV PW:         1.20 cm  LV E/e' medial:  9.7 LV IVS:        0.80 cm  LV e' lateral:   7.51 cm/s LVOT diam:     2.00 cm  LV E/e' lateral: 5.8 LV SV:         31 LV SV Index:   15 LVOT Area:     3.14 cm  LEFT ATRIUM         Index LA diam:    2.60 cm 1.25 cm/m  AORTIC VALVE  PULMONIC VALVE AV Area (Vmax):    2.05 cm    PV Vmax:       0.58 m/s AV Area (Vmean):   2.06 cm    PV Vmean:      43.600 cm/s AV Area (VTI):     2.25 cm    PV VTI:        0.104 m AV Vmax:            107.00 cm/s PV Peak grad:  1.4 mmHg AV Vmean:          68.200 cm/s PV Mean grad:  1.0 mmHg AV VTI:            0.137 m AV Peak Grad:      4.6 mmHg AV Mean Grad:      2.0 mmHg LVOT Vmax:         69.80 cm/s LVOT Vmean:        44.800 cm/s LVOT VTI:          0.098 m LVOT/AV VTI ratio: 0.72  AORTA Ao Root diam: 3.60 cm MITRAL VALVE               TRICUSPID VALVE MV Area (PHT): 6.07 cm    TR Peak grad:   36.0 mmHg MV Area VTI:   2.61 cm    TR Vmax:        300.00 cm/s MV Peak grad:  4.2 mmHg MV Mean grad:  1.0 mmHg    SHUNTS MV Vmax:       1.03 m/s    Systemic VTI:  0.10 m MV Vmean:      53.3 cm/s   Systemic Diam: 2.00 cm MV Decel Time: 125 msec MV E velocity: 43.30 cm/s MV A velocity: 76.30 cm/s MV E/A ratio:  0.57 Kathlyn Sacramento MD Electronically signed by Kathlyn Sacramento MD Signature Date/Time: 03/16/2020/2:02:53 PM    Final    US THYROID  Result Date: 03/16/2020 CLINICAL DATA:  Incidental on CT. Goiter incidentally noted on chest CT EXAM: THYROID ULTRASOUND TECHNIQUE: Ultrasound examination of the thyroid gland and adjacent soft tissues was performed. COMPARISON:  Chest CT-earlier same day FINDINGS: Parenchymal Echotexture: Mildly heterogenous Isthmus: Borderline enlarged measures 0.8 cm in diameter Right lobe: Slightly atrophic measuring 4.3 x 1.4 x 1.5 cm Left lobe: Enlarged measuring 7.3 x 3.9 x 2.8 cm _________________________________________________________ Estimated total number of nodules >/= 1 cm: 1 Number of spongiform nodules >/=  2 cm not described below (TR1): 0 Number of mixed cystic and solid nodules >/= 1.5 cm not described below (TR2): 0 _________________________________________________________ Nodule # 1: Location: Left; Inferior - this nodule correlates with the nodule seen on preceding chest CT Maximum size: 4.1 cm; Other 2 dimensions: 3.4 x 3.1 cm (nodule estimated to measure at least 4.1 cm as there is significant substernal extension demonstrated on preceding chest CT) Composition:  solid/almost completely solid (2) Echogenicity: isoechoic (1) Shape: taller-than-wide (3) Margins: ill-defined (0) Echogenic foci: none (0) ACR TI-RADS total points: 6. ACR TI-RADS risk category: TR4 (4-6 points). ACR TI-RADS recommendations: **Given size (>/= 1.5 cm) and appearance, fine needle aspiration of this moderately suspicious nodule should be considered based on TI-RADS criteria. _________________________________________________________ IMPRESSION: Left-sided thyroid nodule/mass correlating with the nodule seen on preceding chest CT meets imaging criteria to recommend percutaneous sampling as indicated. The above is in keeping with the ACR TI-RADS recommendations - J Am Coll Radiol 2017;14:587-595. Electronically Signed   By: Sandi Mariscal M.D.   On: 03/16/2020 09:42  Assessment/Plan 70 year old female presenting to the ED via EMS from home after a near syncopal event.  Patient found to have saddle pulmonary embolism with right heart strain on CTA vascular surgery was consulted  1.  Pulmonary Embolism:  Patient presented to the [facility] emergency department status post near syncopal event found to have saddle pulmonary embolism with right heart strain on CTA.  Patient notes injury to her left Achilles a few weeks ago.  Denies any other surgery/trauma, prolonged immobility, no recent Covid exposure and no known past medical history of bleeding/clotting disorders.  Patient endorses increased shortness of breath with ambulation.  Patient notes moving around in bed causes increased WOB.  Right heart strain noted on CTA.  Patient also has a large goiter that is possibly impinging on her trachea.  In the setting of large clot burden with right heart strain with progressively worsening shortness of breath with ambulation recommend the patient undergo a pulmonary thrombectomy/pulmonary lysis and attempt to lessen the clot burden and stress on the heart.  Procedure, risks and benefits  were explained to the patient.  All questions were answered.  Patient is to proceed.  2.  DVT: No DVT on venous duplex.   Patient notes injury to left Achilles heel a few weeks ago.  3.  Need for anticoagulation: With recent diagnosis of PE, the patient will most likely be on oral anticoagulation for at least a year.  Discussed with Dr. Francene Castle, PA-C  03/16/2020 2:24 PM  This note was created with Dragon medical transcription system.  Any error is purely unintentional

## 2020-03-16 NOTE — Consult Note (Signed)
Michele, Garza [id number] 07/15/1950 Michele Lipps, MD  Reason for Consult: Evaluate thyroid gland  HPI: Patient is a 70 year old white female who had a leg injury to her left Achilles tendon and has been at bedrest some over the last couple of weeks and not able to do her normal exercising. She had some symptoms on 03/15/2020 of shortness of breath and feeling like she would pass out. She presented to the emergency room and was hypoxic while being tachypneic. She had an angiogram done of her chest which showed a saddle embolus. She has been in ICU and on nasal cannula is currently and blood thinners. A CT scan of her chest showed a large retrosternal thyroid gland nodule that is pushing the trachea to the right. Consultation is placed for evaluation of that.  The patient has not had any thyroid problems that she knows of and gets a routine physical done regularly with no mention ever of thyroid problems. Her family history suggested her mother was noted to be hypothyroid noted in her mid 54s and been on thyroid medication all her life. She is now in her 22s. No other thyroid problems in the family that she knows.  Allergies:  Allergies  Allergen Reactions  . Clindamycin/Lincomycin Rash    ROS: Review of systems normal other than 12 systems except per HPI.  PMH:  Past Medical History:  Diagnosis Date  . Anxiety   . Depression   . Diverticulosis of colon    one prior episode July 2011 caused by tomato overingestion  . GERD (gastroesophageal reflux disease)   . History of colonoscopy    done in  dec 2011, repeat due 5 yrs due to [place]  . Hypertension     FH:  Family History  Problem Relation Age of Onset  . Cancer Mother        bilatera, contralateral recurred after 30 yrs  . Stroke Father   . Heart disease Father 4       died of stroke  at 54, tobacco user  . Cancer Father        colon Ca  . Cancer Paternal Grandmother 61       metastatic colon Ca  . Cancer Maternal Uncle         breast x 2 aunts    SH:  Social History   Socioeconomic History  . Marital status: Divorced    Spouse name: Not on file  . Number of children: Not on file  . Years of education: Not on file  . Highest education level: Not on file  Occupational History  . Occupation: Therapist, sports: [employer]    Comment: Dr Juanda Crumble (optometrist in [facility])  Tobacco Use  . Smoking status: Never Smoker  . Smokeless tobacco: Never Used  Vaping Use  . Vaping Use: Never used  Substance and Sexual Activity  . Alcohol use: Yes    Alcohol/week: 1.0 standard drink    Types: 1 Shots of liquor per week    Comment: occasional  . Drug use: No  . Sexual activity: Not on file  Other Topics Concern  . Not on file  Social History Narrative  . Not on file   Social Determinants of Health   Financial Resource Strain: Low Risk   . Difficulty of Paying Living Expenses: Not hard at all  Food Insecurity: Not on file  Transportation Needs: No Transportation Needs  . Lack of Transportation (Medical): No  .  Lack of Transportation (Non-Medical): No  Physical Activity: Not on file  Stress: No Stress Concern Present  . Feeling of Stress : Not at all  Social Connections: Unknown  . Frequency of Communication with Friends and Family: Not on file  . Frequency of Social Gatherings with Friends and Family: Not on file  . Attends Religious Services: More than 4 times per year  . Active Member of Clubs or Organizations: Yes  . Attends Archivist Meetings: Not on file  . Marital Status: Not on file  Intimate Partner Violence: Not on file    PSH:  Past Surgical History:  Procedure Laterality Date  . BREAST SURGERY     sterotactic breaswt biopsy: atypical cells,  continued diagnostic mammograms  . COLONOSCOPY    . COLONOSCOPY WITH PROPOFOL N/A 02/21/2020   Procedure: COLONOSCOPY WITH PROPOFOL;  Surgeon: Virgel Manifold, MD;  Location: [facility] ENDOSCOPY;  Service: Endoscopy;   Laterality: N/A;    Physical  Exam: The patient is awake and alert very cooperative. Her nose is open anteriorly and she is on nasal cannulas. Her oropharynx is no oral lesions open her mouth widely and her posterior pharynx is clear. Her neck is full and fat but no nodes or masses are palpable. Her thyroids in the midline low in the neck has a so relatively short neck. I cannot feel any thyroid masses at all or any lymphadenopathy.  I reviewed the CT scan and this shows a large retrosternal thyroid that is underneath the clavicles. The larynx is in the midline but then the mass pushes the trachea to the right side but does not occlude the trachea or narrow it and any areas. The mass is rounded and extends from the level of the clavicles down to just above and adjacent to the aortic arch.   A/P: The patient has a very large substernal thyroid nodule that seems to come off of the left side of the thyroid and is pushing the trachea to her right side. It is not occluding the trachea and is not affecting the larynx at all. This is roundish and does not appear to be invading any other structures. I have discussed this with Dr. Stoney Bang. At this point we can get a ultrasound of the thyroid nodule and plan an ultrasound-guided FNA to get some histopathology. This will need to be removed in the future because of the deviation of the trachea and concern for potential causing airway problems in the future. This would best be done when she is more stable and can be off of blood thinners temporarily for the surgery. This will also involve someone with thoracic surgery experience because of being in the mediastinum and down to the aortic arch. We will wait till she is more stable before planning to do any further work-up.   Michele Garza 03/16/2020 10:27 AM

## 2020-03-16 NOTE — Progress Notes (Signed)
[facility] for Heparin Infusion Indication: pulmonary embolus  Allergies  Allergen Reactions  . Clindamycin/Lincomycin Rash    Patient Measurements: Height: 5\' 3"  (160 cm) Weight: 108.7 kg (239 lb 10.2 oz) IBW/kg (Calculated) : 52.4 Heparin Dosing Weight: 78.9kg  Vital Signs: Temp: 98 F (36.7 C) (02/28 1130) Temp Source: Oral (02/28 1130) BP: 103/92 (02/28 1200) Pulse Rate: 105 (02/28 1200)  Labs: Recent Labs    03/16/20 0233 03/16/20 0509 03/16/20 1105  HGB 12.8  --   --   HCT 39.0  --   --   PLT 178  --   --   APTT  --  27  --   LABPROT  --  14.0  --   INR  --  1.1  --   HEPARINUNFRC  --   --  1.13*  CREATININE 1.34*  --   --   TROPONINIHS 215*  --  376*    Estimated Creatinine Clearance: 47.5 mL/min (A) (by C-G formula based on SCr of 1.34 mg/dL (H)).   Medical History: Past Medical History:  Diagnosis Date  . Anxiety   . Depression   . Diverticulosis of colon    one prior episode July 2011 caused by tomato overingestion  . GERD (gastroesophageal reflux disease)   . History of colonoscopy    done in  dec 2011, repeat due 5 yrs due to [place]  . Hypertension     Assessment: Pt is 70 yo female after near syncopal event with CT angio of chest was positive for pulmonary emboli with clot in both main pulmonary arteries & lobar arteries.  Goal of Therapy:  Heparin level 0.3-0.7 units/ml Monitor platelets by anticoagulation protocol: Yes   2/28 1105 HL 1.13, supratherapeutic  Plan:  Hold heparin drip for one hour. Decrease rate to 1100 units/hr. Recheck HL at 2000. CBC daily while on heparin drip.   Dorena Bodo, PharmD Clinical Pharmacist 03/16/2020 12:30 PM

## 2020-03-17 ENCOUNTER — Encounter
Admission: EM | Disposition: A | Discharge status: Home Health | Payer: Self-pay | Source: Home / Self Care | Attending: Internal Medicine

## 2020-03-17 ENCOUNTER — Encounter: Payer: Self-pay | Admitting: Vascular Surgery

## 2020-03-17 DIAGNOSIS — I2609 Other pulmonary embolism with acute cor pulmonale: Secondary | ICD-10-CM | POA: Diagnosis not present

## 2020-03-17 DIAGNOSIS — I2699 Other pulmonary embolism without acute cor pulmonale: Secondary | ICD-10-CM

## 2020-03-17 HISTORY — PX: PULMONARY THROMBECTOMY: CATH118295

## 2020-03-17 LAB — BASIC METABOLIC PANEL
Anion gap: 9 (ref 5–15)
BUN: 31 mg/dL — ABNORMAL HIGH (ref 8–23)
CO2: 19 mmol/L — ABNORMAL LOW (ref 22–32)
Calcium: 8.4 mg/dL — ABNORMAL LOW (ref 8.9–10.3)
Chloride: 104 mmol/L (ref 98–111)
Creatinine, Ser: 1.17 mg/dL — ABNORMAL HIGH (ref 0.44–1.00)
GFR, Estimated: 51 mL/min — ABNORMAL LOW (ref >60)
Glucose, Bld: 140 mg/dL — ABNORMAL HIGH (ref 70–99)
Potassium: 4.1 mmol/L (ref 3.5–5.1)
Sodium: 132 mmol/L — ABNORMAL LOW (ref 135–145)

## 2020-03-17 LAB — CBC
HCT: 37.8 % (ref 36.0–46.0)
Hemoglobin: 13 g/dL (ref 12.0–15.0)
MCH: 31.7 pg (ref 26.0–34.0)
MCHC: 34.4 g/dL (ref 30.0–36.0)
MCV: 92.2 fL (ref 80.0–100.0)
Platelets: 178 10*3/uL (ref 150–400)
RBC: 4.1 MIL/uL (ref 3.87–5.11)
RDW: 13.4 % (ref 11.5–15.5)
WBC: 14.9 10*3/uL — ABNORMAL HIGH (ref 4.0–10.5)
nRBC: 0 % (ref 0.0–0.2)

## 2020-03-17 LAB — MAGNESIUM: Magnesium: 1.6 mg/dL — ABNORMAL LOW (ref 1.7–2.4)

## 2020-03-17 LAB — HEPARIN LEVEL (UNFRACTIONATED)
Heparin Unfractionated: 0.93 IU/mL — ABNORMAL HIGH (ref 0.30–0.70)
Heparin Unfractionated: 0.93 IU/mL — ABNORMAL HIGH (ref 0.30–0.70)
Heparin Unfractionated: 0.26 IU/mL — ABNORMAL LOW (ref 0.30–0.70)

## 2020-03-17 LAB — THYROID PANEL WITH TSH
Free Thyroxine Index: 2.2 (ref 1.2–4.9)
T3 Uptake Ratio: 26 % (ref 24–39)
T4, Total: 8.6 ug/dL (ref 4.5–12.0)
TSH: 0.555 u[IU]/mL (ref 0.450–4.500)

## 2020-03-17 LAB — GLUCOSE, CAPILLARY: Glucose-Capillary: 124 mg/dL — ABNORMAL HIGH (ref 70–99)

## 2020-03-17 LAB — PHOSPHORUS: Phosphorus: 3.2 mg/dL (ref 2.5–4.6)

## 2020-03-17 LAB — HOMOCYSTEINE: Homocysteine: 5.3 umol/L (ref 0.0–17.2)

## 2020-03-17 SURGERY — PULMONARY THROMBECTOMY
Anesthesia: Moderate Sedation | Laterality: Bilateral

## 2020-03-17 MED ORDER — HYDROMORPHONE HCL 1 MG/ML IJ SOLN: 1.0000 mg | Freq: Once | INTRAMUSCULAR | Status: DC | PRN

## 2020-03-17 MED ORDER — ALTEPLASE 2 MG IJ SOLR
INTRAMUSCULAR | Status: AC
  Filled 2020-03-17: qty 10

## 2020-03-17 MED ORDER — HYDROCODONE-ACETAMINOPHEN 5-325 MG PO TABS
1.0000 | ORAL_TABLET | Freq: Four times a day (QID) | ORAL | Status: DC | PRN
  Administered 2020-03-19: 1 via ORAL
  Administered 2020-03-19: 2 via ORAL
  Filled 2020-03-17: qty 1
  Filled 2020-03-17: qty 2

## 2020-03-17 MED ORDER — FENTANYL CITRATE (PF) 100 MCG/2ML IJ SOLN
INTRAMUSCULAR | Status: AC
  Filled 2020-03-17: qty 4

## 2020-03-17 MED ORDER — MIDAZOLAM HCL 2 MG/2ML IJ SOLN
INTRAMUSCULAR | Status: DC | PRN
  Administered 2020-03-17: 2 mg via INTRAVENOUS

## 2020-03-17 MED ORDER — AMIODARONE IV BOLUS ONLY 150 MG/100ML
150.0000 mg | Freq: Once | INTRAVENOUS | Status: DC
  Filled 2020-03-17: qty 100

## 2020-03-17 MED ORDER — HEPARIN SODIUM (PORCINE) 1000 UNIT/ML IJ SOLN
INTRAMUSCULAR | Status: AC
  Filled 2020-03-17: qty 1

## 2020-03-17 MED ORDER — MIDAZOLAM HCL 5 MG/5ML IJ SOLN
INTRAMUSCULAR | Status: AC
  Filled 2020-03-17: qty 5

## 2020-03-17 MED ORDER — MIDAZOLAM HCL 2 MG/ML PO SYRP
8.0000 mg | ORAL_SOLUTION | Freq: Once | ORAL | Status: DC | PRN
  Filled 2020-03-17: qty 4

## 2020-03-17 MED ORDER — FENTANYL CITRATE (PF) 100 MCG/2ML IJ SOLN
INTRAMUSCULAR | Status: DC | PRN
  Administered 2020-03-17: 50 ug via INTRAVENOUS

## 2020-03-17 MED ORDER — MAGNESIUM SULFATE 4 GM/100ML IV SOLN
4.0000 g | Freq: Once | INTRAVENOUS | Status: AC
  Administered 2020-03-17: 4 g via INTRAVENOUS
  Filled 2020-03-17: qty 100

## 2020-03-17 MED ORDER — ALTEPLASE 2 MG IJ SOLR
INTRAMUSCULAR | Status: DC | PRN
  Administered 2020-03-17: 12 mg

## 2020-03-17 MED ORDER — CEFAZOLIN SODIUM-DEXTROSE 2-4 GM/100ML-% IV SOLN
2.0000 g | Freq: Once | INTRAVENOUS | Status: AC
  Administered 2020-03-17: 2 g via INTRAVENOUS
  Filled 2020-03-17 (×2): qty 100

## 2020-03-17 MED ORDER — SODIUM CHLORIDE 0.9 % IV SOLN: INTRAVENOUS | Status: DC

## 2020-03-17 MED ORDER — ACETAMINOPHEN 325 MG PO TABS
650.0000 mg | ORAL_TABLET | Freq: Four times a day (QID) | ORAL | Status: DC | PRN
  Administered 2020-03-17 – 2020-03-20 (×2): 650 mg via ORAL
  Filled 2020-03-17 (×2): qty 2

## 2020-03-17 MED ORDER — METHYLPREDNISOLONE SODIUM SUCC 125 MG IJ SOLR: 125.0000 mg | Freq: Once | INTRAMUSCULAR | Status: DC | PRN

## 2020-03-17 MED ORDER — ALTEPLASE 2 MG IJ SOLR
INTRAMUSCULAR | Status: AC
  Filled 2020-03-17: qty 2

## 2020-03-17 MED ORDER — FAMOTIDINE 20 MG PO TABS: 40.0000 mg | ORAL_TABLET | Freq: Once | ORAL | Status: DC | PRN

## 2020-03-17 MED ORDER — HEPARIN (PORCINE) 25000 UT/250ML-% IV SOLN
1100.0000 [IU]/h | INTRAVENOUS | Status: AC
  Administered 2020-03-17: 15:00:00 800 [IU]/h via INTRAVENOUS
  Administered 2020-03-18: 900 [IU]/h via INTRAVENOUS
  Administered 2020-03-19: 1100 [IU]/h via INTRAVENOUS
  Filled 2020-03-17 (×2): qty 250

## 2020-03-17 MED ORDER — ONDANSETRON HCL 4 MG/2ML IJ SOLN: 4.0000 mg | Freq: Four times a day (QID) | INTRAMUSCULAR | Status: DC | PRN

## 2020-03-17 MED ORDER — DIPHENHYDRAMINE HCL 50 MG/ML IJ SOLN: 50.0000 mg | Freq: Once | INTRAMUSCULAR | Status: DC | PRN

## 2020-03-17 MED ORDER — METOPROLOL TARTRATE 5 MG/5ML IV SOLN
5.0000 mg | Freq: Once | INTRAVENOUS | Status: AC
  Administered 2020-03-17: 5 mg via INTRAVENOUS
  Filled 2020-03-17: qty 5

## 2020-03-17 MED ORDER — METOPROLOL SUCCINATE ER 25 MG PO TB24
25.0000 mg | ORAL_TABLET | Freq: Every day | ORAL | Status: DC
  Administered 2020-03-17 – 2020-03-20 (×4): 25 mg via ORAL
  Filled 2020-03-17 (×3): qty 1

## 2020-03-17 MED ORDER — METOPROLOL TARTRATE 5 MG/5ML IV SOLN
5.0000 mg | Freq: Once | INTRAVENOUS | Status: AC
  Administered 2020-03-17: 5 mg via INTRAVENOUS

## 2020-03-17 MED ORDER — METOPROLOL TARTRATE 5 MG/5ML IV SOLN: 5.0000 mg | Freq: Once | INTRAVENOUS | Status: DC

## 2020-03-17 MED ORDER — IODIXANOL 320 MG/ML IV SOLN
INTRAVENOUS | Status: DC | PRN
  Administered 2020-03-17: 60 mL

## 2020-03-17 MED ORDER — PROMETHAZINE HCL 25 MG/ML IJ SOLN: 12.5000 mg | Freq: Four times a day (QID) | INTRAMUSCULAR | Status: DC | PRN

## 2020-03-17 SURGICAL SUPPLY — 15 items
CANISTER PENUMBRA ENGINE (MISCELLANEOUS) ×2 IMPLANT
CANNULA 5F STIFF (CANNULA) ×2 IMPLANT
CATH ANGIO 5F PIGTAIL 100CM (CATHETERS) ×2 IMPLANT
CATH INDIGO SEP 8 (CATHETERS) ×2 IMPLANT
CATH INFINITI JR4 5F (CATHETERS) ×2 IMPLANT
CATH LIGHTNING 8 XTORQ 115 (CATHETERS) ×2 IMPLANT
DEVICE SAFEGUARD 24CM (GAUZE/BANDAGES/DRESSINGS) ×4 IMPLANT
GLIDEWIRE ADV .035X260CM (WIRE) ×2 IMPLANT
PACK ANGIOGRAPHY (CUSTOM PROCEDURE TRAY) ×2 IMPLANT
SHEATH BRITE TIP 5FRX11 (SHEATH) ×2 IMPLANT
SHEATH DESTINIATION 65 8FR (SHEATH) ×2 IMPLANT
SYR MEDRAD MARK 7 150ML (SYRINGE) ×2 IMPLANT
TUBING CONTRAST HIGH PRESS 72 (TUBING) ×2 IMPLANT
WIRE AMPLATZ SSTIFF .035X260CM (WIRE) ×2 IMPLANT
WIRE GUIDERIGHT .035X150 (WIRE) ×2 IMPLANT

## 2020-03-17 NOTE — Progress Notes (Signed)
ANTICOAGULATION CONSULT NOTE   Pharmacy Consult for Heparin Infusion Indication: pulmonary embolus  Allergies  Allergen Reactions   Clindamycin/Lincomycin Rash    Patient Measurements: Height: 5\' 3"  (160 cm) Weight: 109 kg (240 lb 4.8 oz) IBW/kg (Calculated) : 52.4 Heparin Dosing Weight: 78.9kg  Vital Signs: Temp: 98.7 F (37.1 C) (03/01 2000) Temp Source: Oral (03/01 2000) BP: 107/66 (03/01 2138) Pulse Rate: 138 (03/01 2138)  Labs: Recent Labs    03/16/20 0233 03/16/20 0509 03/16/20 1105 03/16/20 2001 03/17/20 0323 03/17/20 1216 03/17/20 2109  HGB 12.8  --   --   --  13.0  --   --   HCT 39.0  --   --   --  37.8  --   --   PLT 178  --   --   --  178  --   --   APTT  --  27  --   --   --   --   --   LABPROT  --  14.0  --   --   --   --   --   INR  --  1.1  --   --   --   --   --   HEPARINUNFRC  --   --  1.13*   < > 0.93* 0.93* 0.26*  CREATININE 1.34*  --   --   --  1.17*  --   --   TROPONINIHS 215*  --  376*  --   --   --   --    < > = values in this interval not displayed.    Estimated Creatinine Clearance: 54.5 mL/min (A) (by C-G formula based on SCr of 1.17 mg/dL (H)).   Medical History: Past Medical History:  Diagnosis Date   Anxiety    Depression    Diverticulosis of colon    one prior episode July 2011 caused by tomato overingestion   GERD (gastroesophageal reflux disease)    History of colonoscopy    done in  dec 2011, repeat due 5 yrs due to [place]   Hypertension     Assessment: Pt is 70 yo female after near syncopal event with CT angio of chest was positive for pulmonary emboli with clot in both main pulmonary arteries & lobar arteries. Heparin Dosing Weight: 78.9kg   Patient is s/p thrombectomy. Consult to restart heparin following procedure.  Goal of Therapy:  Heparin level 0.3-0.7 units/ml Monitor platelets by anticoagulation protocol: Yes   Date Time HL Rate/comment 2/28 1105 1.13 1350 units/hr 2/28 2001 0.89 1100 units/hr 3/01      0323    0.93 950 units/hr  3/01 1216 0.93 Heparin running during lab draw 3/01 2109 0.26 800 units/hr  Plan:  Heparin level subtherapeutic, will increase rate to 900 units/hr w/o bolus since patient had a previous supratherapeutic level at 950 units/hr Will recheck HL 6 hours after rate change  CBC daily while on heparin drip.  Darnelle Bos, PharmD Clinical Pharmacist 03/17/2020 9:51 PM

## 2020-03-17 NOTE — Progress Notes (Signed)
ANTICOAGULATION CONSULT NOTE   Pharmacy Consult for Heparin Infusion Indication: pulmonary embolus  Allergies  Allergen Reactions   Clindamycin/Lincomycin Rash    Patient Measurements: Height: 5\' 3"  (160 cm) Weight: 109 kg (240 lb 4.8 oz) IBW/kg (Calculated) : 52.4 Heparin Dosing Weight: 78.9kg  Vital Signs: BP: 106/74 (03/01 0400) Pulse Rate: 99 (03/01 0400)  Labs: Recent Labs    03/16/20 0233 03/16/20 0509 03/16/20 1105 03/16/20 2001 03/17/20 0323  HGB 12.8  --   --   --  13.0  HCT 39.0  --   --   --  37.8  PLT 178  --   --   --  178  APTT  --  27  --   --   --   LABPROT  --  14.0  --   --   --   INR  --  1.1  --   --   --   HEPARINUNFRC  --   --  1.13* 0.89* 0.93*  CREATININE 1.34*  --   --   --  1.17*  TROPONINIHS 215*  --  376*  --   --     Estimated Creatinine Clearance: 54.5 mL/min (A) (by C-G formula based on SCr of 1.17 mg/dL (H)).   Medical History: Past Medical History:  Diagnosis Date   Anxiety    Depression    Diverticulosis of colon    one prior episode July 2011 caused by tomato overingestion   GERD (gastroesophageal reflux disease)    History of colonoscopy    done in  dec 2011, repeat due 5 yrs due to [place]   Hypertension     Assessment: Pt is 70 yo female after near syncopal event with CT angio of chest was positive for pulmonary emboli with clot in both main pulmonary arteries & lobar arteries. Heparin Dosing Weight: 78.9kg H/H and plts WNL  Goal of Therapy:  Heparin level 0.3-0.7 units/ml Monitor platelets by anticoagulation protocol: Yes   Date Time HL Rate/comment 2/28 1105 1.13 1350 units/hr 2/28 2001 0.89 1100 units/hr 3/01     0323    0.93       950 units/hr   Plan:  3/1:  HL @ 0323 = 0.93  Will decrease heparin drip rate to 800 units/hr and recheck 6 hrs after rate change.   Orene Desanctis, PharmD Clinical Pharmacist 03/17/2020 4:40 AM

## 2020-03-17 NOTE — Op Note (Signed)
[facility]  Percutaneous Study/Intervention Procedural Note   Date of Surgery: 03/17/2020,2:02 PM  Surgeon: Hortencia Pilar  Pre-operative Diagnosis: Symptomatic bilateral pulmonary emboli  Post-operative diagnosis:  Same  Procedure(s) Performed:  1.  Contrast injection right heart  2.  Thrombolysis bilateral right middle and lower lobe pulmonary arteries and left upper and lower lobe pulmonary arteries  3.  Mechanical thrombectomy multiple lobar pulmonary arteries  4.  Selective catheter placement right lower and middle pulmonary arteries  5.  Selective catheter placement left upper and lower pulmonary arteries    Anesthesia: Conscious sedation was administered under my direct supervision by the interventional radiology RN. IV Versed plus fentanyl were utilized. Continuous ECG, pulse oximetry and blood pressure was monitored throughout the entire procedure.  Versed and fentanyl were administered intravenously.  Conscious sedation was administered for a total of 41 minutes and 48 seconds.  EBL: 100  Sheath: 65 cm destination sheath right common femoral vein antegrade  Contrast: 60 cc   Fluoroscopy Time: 8.7 minutes  Indications:  Patient presents with pulmonary emboli. The patient is symptomatic with hypoxemia and dyspnea on exertion.  There is evidence of right heart strain on the CT angiogram. The patient is otherwise a good candidate for intervention and even the long-term benefits pulmonary angiography with thrombolysis is offered. The risks and benefits are reviewed long-term benefits are discussed. All questions are answered patient agrees to proceed.  Procedure:  Michele Garza a 70 y.o. female who was identified and appropriate procedural time out was performed.  The patient was then placed supine on the table and prepped and draped in the usual sterile fashion.  Ultrasound was used to evaluate the right common femoral vein.  It was patent, as it was  echolucent and compressible.  A digital ultrasound image was acquired for the permanent record.  A Seldinger needle was used to access the right common femoral vein under direct ultrasound guidance.  A 0.035 J wire was advanced without resistance and a 5Fr sheath was placed and then upsized to an 8 [place] sheath.    The wire and pigtail catheter were then negotiated into the right atrium and bolus injection of contrast was utilized to demonstrate the right ventricle and the pulmonary artery outflow. The wire and catheter were then negotiated into the main pulmonary artery where hand injection of contrast was utilized to demonstrate the pulmonary arteries and confirm the locations of the pulmonary emboli.  Amplatz wire was then introduced through the catheter and the catheter removed.  The 5 French sheath was removed and an 8 [place] 65 cm destination sheath was advanced over the Amplatz wire positioned in the left main pulmonary artery  TPA was reconstituted and delivered onto the table. A total of 12 milligrams of TPA was utilized.  6 mg was administered on the left side and 6 mg was administered on the right side. This was then allowed to dwell.  The Penumbra Cat 8 catheter was then advanced up into the pulmonary vasculature. The left lung was addressed first. Catheter was negotiated into the left lower lobe and mechanical thrombectomy was performed using the separator. Follow-up imaging demonstrated a good result and therefore the catheter was renegotiated into the left upper lobe pulmonary artery and again mechanical thrombectomy was performed. Passes were made with both the Penumbra catheter itself as well as introducing the separator.   The Penumbra Cat 8 catheter was then negotiated to the opposite side. The right lung was then addressed. Catheter  was negotiated into the right middle lobe and mechanical thrombectomy was performed. Follow-up imaging demonstrated a good result and therefore the catheter  was renegotiated into the right lower lobe pulmonary artery and again mechanical thrombectomy was performed. Passes were made with both the Penumbra catheter itself as well as introducing the separator. Follow-up imaging was then performed.  After review these images wires were reintroduced and the catheters removed. Then, the sheath is then pulled and pressures held. A safeguard is placed.    Findings:   Right heart imaging:  Right atrium and right ventricle and the pulmonary outflow tract appears normal and free of obstruction  Right lung: On initial evaluation there is filling of the right upper lobe but there is near total nonvisualization of the right middle and right lower lobes.  Following thrombectomy there is now full opacification of the right lower lobe.  The right middle lobe main lobar artery and its subsegmental branches are also now visualized markedly improved.  Left lung: On initial evaluation there is nonfilling of the left lung.  There is a abrupt cut off in the distal left main pulmonary artery.  Following mechanical thrombectomy there is now opacification of the right upper and right lower lobes as well as all the subsegmental branches and distal filling is also noted as well.    Disposition: Patient was taken to the recovery room in stable condition having tolerated the procedure well.  Michele Garza 03/17/2020,2:02 PM

## 2020-03-17 NOTE — Progress Notes (Signed)
Tana Conch notified of patients heart rate afib 140's. Orders to give metoprolol 5 mg once.

## 2020-03-17 NOTE — Progress Notes (Signed)
When entering patients room, patient converted to Sinus Rhythm 80-90's. Per Tana Conch hold off on administering amiodarone bolus. Continue to assess.

## 2020-03-17 NOTE — Progress Notes (Signed)
Acceptance note: Michele Garza is a 70 year old female with history of essential hypertension, morbid obesity, depression, who initially presents to the emergency room with a near syncope episode.  Her CT scan on 2/28 showed acute pulmonary embolus in both main arteries and a lobular arteries.  Evidence of right heart strain.  Also has bulky thyroid goiter the retrosternal extension and compression of the trachea.  Patient also had atrial fibrillation with RVR, she also developed acute hypoxemic respiratory failure with hypotension. Patient had thrombolysis and thrombectomy in multiple pulmonary arteries on 03/17/2020. Patient is transferred to medical floor on 03/17/2020. [facility] hospitalist will pick up patient care tomorrow.

## 2020-03-17 NOTE — Progress Notes (Signed)
[facility] for Heparin Infusion Indication: pulmonary embolus  Allergies  Allergen Reactions  . Clindamycin/Lincomycin Rash    Patient Measurements: Height: 5\' 3"  (160 cm) Weight: 109 kg (240 lb 4.8 oz) IBW/kg (Calculated) : 52.4 Heparin Dosing Weight: 78.9kg  Vital Signs: Temp: 98.1 F (36.7 C) (03/01 0800) Temp Source: Oral (03/01 1241) BP: 133/82 (03/01 1436) Pulse Rate: 41 (03/01 1430)  Labs: Recent Labs    03/16/20 0233 03/16/20 0509 03/16/20 1105 03/16/20 1105 03/16/20 2001 03/17/20 0323 03/17/20 1216  HGB 12.8  --   --   --   --  13.0  --   HCT 39.0  --   --   --   --  37.8  --   PLT 178  --   --   --   --  178  --   APTT  --  27  --   --   --   --   --   LABPROT  --  14.0  --   --   --   --   --   INR  --  1.1  --   --   --   --   --   HEPARINUNFRC  --   --  1.13*   < > 0.89* 0.93* 0.93*  CREATININE 1.34*  --   --   --   --  1.17*  --   TROPONINIHS 215*  --  376*  --   --   --   --    < > = values in this interval not displayed.    Estimated Creatinine Clearance: 54.5 mL/min (A) (by C-G formula based on SCr of 1.17 mg/dL (H)).   Medical History: Past Medical History:  Diagnosis Date  . Anxiety   . Depression   . Diverticulosis of colon    one prior episode July 2011 caused by tomato overingestion  . GERD (gastroesophageal reflux disease)   . History of colonoscopy    done in  dec 2011, repeat due 5 yrs due to [place]  . Hypertension     Assessment: Pt is 70 yo female after near syncopal event with CT angio of chest was positive for pulmonary emboli with clot in both main pulmonary arteries & lobar arteries. Heparin Dosing Weight: 78.9kg   Patient is s/p thrombectomy. Consult to restart heparin following procedure.  Goal of Therapy:  Heparin level 0.3-0.7 units/ml Monitor platelets by anticoagulation protocol: Yes   Date Time HL Rate/comment 2/28 1105 1.13 1350 units/hr 2/28 2001 0.89 1100 units/hr 3/01      0323    0.93       950 units/hr  3/01 1216 0.93 Heparin running during lab draw  Plan:  Heparin restarted at previous rate. HL ordered for 2100. CBC daily while on heparin drip.  Tawnya Crook, PharmD Clinical Pharmacist 03/17/2020 3:22 PM

## 2020-03-17 NOTE — Progress Notes (Signed)
NAME:  Michele Garza, MRN:  [id number], DOB:  05/11/1950, LOS: 1 ADMISSION DATE:  03/16/2020, CONSULTATION DATE:  03/16/2020 REFERRING MD:  Dr. Karma Greaser, CHIEF COMPLAINT:  Near Syncope/ Anxiety   Brief History:  70 yo F with submassive pulmonary emboli and right heart strain admitted to ICU.  History of Present Illness:  70 year old female presenting to the ED via EMS from home after a near syncopal event.  She reports feeling "odd" similar to times when she has had anxiety in the past.  She reported not feeling quite right since Saturday, 03/14/2020, but thought it was anxiety/nerves as she has a lot going on at the moment.  However on Sunday night to 02/24/20 she was attempting to walk her dog and felt as though she was going to pass out on the elevator.  She laid down on the floor of the elevator, holding the door open until a neighbor passed by and called EMS.  EMS stated her SPO2 was 88% on room air and that she was hyperventilating.   ED course: Initial vitals-afebrile at 98, RR-18, tachycardia at 102, BP-111/81, SPO2 98% on 3 L nasal cannula. Upon arrival to the ED she was no longer tachypneic but was more hypoxic with an SPO2 of 80% on room air.  She denies chest pain, and only admits to shortness of breath with exertion. She reports recently rupturing her left Achilles tendon in between that and chronic kidney injury her mobility has been reduced.  She has no history of blood clots, is vaccinated and boosted against COVID-19.  She denies fever, chills, sore throat, nausea, vomiting, abdominal pain and dysuria.  CT angio of chest was positive for pulmonary emboli with clot in both main pulmonary arteries & lobar arteries.  Evidence of right heart strain RV to LV ratio greater than 1.5.  The CT angio also showed a bulky thyroid goiter causing moderate compression of the trachea as well as nonspecific inflammation at the porta hepatis and of the gallbladder. While in the emergency department the  patient's oxygen requirements increased to a non rebreather.  When she attempted to get up she felt lightheaded and became hypoxic.  Before transferring up to the ICU her blood pressure dropped with systolics in the 35'H, and a liter bolus of saline was administered. PCCM was consulted for admission.  Past Medical History:  Hypertension Hyperlipidemia GERD Diverticulosis of the colon Depression Anxiety Obesity Significant Hospital Events:  03/16/20 - Admit to ICU on heparin drip for submassive pulmonary emboli and right heart strain.  Consults:  Vascular Surgery ENT  Procedures:  N/A  Significant Diagnostic Tests:  03/16/20 Cta chest PE>> positive for acute pulmonary embolus with clot in both main PAs and lobar arteries.  CT evidence of right heart strain RV to LV ratio suspicious for at least submassive PE > 1.5.   Trace pleural fluid and mild groundglass opacity no discrete pulmonary infarct.  Evidence also of nonspecific inflammation at the porta hepatis with small volume perihepatic ascites.   Questionable inflammation of the gallbladder.   Bulky thyroid goiter with retrosternal extension and moderate compression of the trachea at the thoracic inlet.  03/16/2020: Thyroid US>>Left-sided thyroid nodule/mass correlating with the nodule seen on preceding chest CT meets imaging criteria to recommend percutaneous sampling as indicated. 03/16/2020: Venous US Bilateral LE>>No evidence of DVT within either lower extremity. 03/16/2020: Echocardiogram>>1. Left ventricular ejection fraction, by estimation, is 55 to 60%. The  left ventricle has normal function. The left ventricle has no regional  wall motion abnormalities. Left ventricular diastolic parameters are  consistent with Grade I diastolic  dysfunction (impaired relaxation).  2. Right ventricular systolic function is mildly reduced. The right  ventricular size is moderately enlarged. There is moderately elevated  pulmonary artery  systolic pressure. The estimated right ventricular  systolic pressure is 50.0 mmHg.  3. The mitral valve is normal in structure. No evidence of mitral valve  regurgitation. No evidence of mitral stenosis.  4. The aortic valve is normal in structure. Aortic valve regurgitation is  not visualized. No aortic stenosis is present.  5. The inferior vena cava is dilated in size with <50% respiratory  variability, suggesting right atrial pressure of 15 mmHg.   Micro Data:  03/16/2020 COVID-19 >> negative 03/16/2020 influenza A/B >> negative 03/16/20: HIV screen>>nonreactive 03/16/20: MRSA PCR>>negative  Antimicrobials:  N/A  Interim History / Subjective:  No acute events noted overnight Afebrile, hemodynamically stable, NO vasopressors, Atrial Fib on telemetry w/ HR 120's On 6L nasal cannula Creatinine improved to 1.17 Plan for Thrombectomy today with Vascular Surgery Denies SOB, chest pain, palpitations, abdominal pain, vomiting, fever/chills, pain Does report intermittent Nausea  Objective   Blood pressure (!) 115/92, pulse 98, temperature 98.8 F (37.1 C), temperature source Axillary, resp. rate 17, height 5\' 3"  (1.6 m), weight 109 kg, SpO2 92 %.    FiO2 (%):  [60 %] 60 %   Intake/Output Summary (Last 24 hours) at 03/17/2020 0832 Last data filed at 03/17/2020 0649 Gross per 24 hour  Intake 2135.14 ml  Output 0 ml  Net 2135.14 ml   Filed Weights   03/16/20 0224 03/16/20 0602 03/17/20 0427  Weight: 110 kg 108.7 kg 109 kg    Examination: General: Acutely ill appearing female, sitting in bed, on 6L [place], in NAD HEENT: Atraumatic, normocephalic, neck supple, no JVD, MM moist/pink Neuro: Awake, A&O x4, follows commands, no focal deficits, speech clear, pupils PERRL CV: Tachycardia, irregularly irregular rhythm, no M/R/G Pulm: Clear bilaterally to RUL, RML, & LUL, diminished to bilateral bases, no wheezing; even, nonlabored GI: Obese, soft, nontender, nondistended, no guarding or  rebound tenderness, BS+ x4 Skin: Warm and dry.  Minimal bruising around left ankle due to recent Achilles injury, no obvious rashes, lesions, or ulcerations Extremities: Warm.  No deformities, 1+ edema bilateral LE  Resolved Hospital Problem list     Assessment & Plan:  Near Syncope in the setting of Submassive Pulmonary emboli Atrial Fibrillation w/ RVR>>resolved Acute hypoxic respiratory distress Hypotension Hx: HTN CTa chest: Positive for acute pulmonary embolus with clot in both main PAs and lobar arteries, RV to LV ratio greater than 1.5, PESI score:138/Class V, Wells score: 4.5, EKG: Sinus Tachycardia with non-specific ST/T wave changes. BNP: 477, Troponin: 215. Given 1 L NS in ED due to marginal BP's -Continuous cardiac monitoring -Maintain MAP >65 -Supplemental O2 as needed to maintain O2 sats >92% -IV fluids (LR & 50 ml/hr) -Continue Midodrine -Heparin gtt -Vascular Surgery following, plan for thrombectomy today 03/17/20 -Doppler BLE negative for DVT -Hypercoagulable panel pending -ECHO w/ LVEF 37-04%, Grade I diastolic dysfunction, RV systolic function mildly reduced, moderately elevated pulmonary artery systolic pressure     - Home regimen on hold: losartan & metoprolol due to marginal BP at admission, consider restarting       once patient has stabilized    -Converted to NSR with Metoprolol 5 mg IV x1 dose   Acute Kidney Injury  Baseline Cr: 0.90, Cr on admission:1.34 -Monitor I&O's / urinary output -Follow BMP -  Ensure adequate renal perfusion -Avoid nephrotoxic agents as able -Replace electrolytes as indicated -IV fluids   Thyroid Mass Bulky thyroid goiter with retrosternal extension and moderate compression of the trachea at the thoracic inlet. - Thyroid panel normal (TSH 0.55, T4 8.6) -ENT following, appreciate input by Dr. Charolett Bumpers ~ will need an ultrasound-guided FNA when she is more stable, and will need removal of mass due to deviation of the trachea and  concern for potential causing airway problems in the future.   Transaminitis in the setting of nonspecific inflammation at the [place] gallbladder AST: 71, ALT: 51. Previous hepatic function normal (9/21) - Trend hepatic function - f/u CT abdomen/pelvis with contrast when able - avoid hepatotoxic agents   Hyperlipidemia - Home regimen on hold: atorvastatin, due to Transaminitis    Anxiety - xanax TID PRN - supportive care  Best practice (evaluated daily)  Diet: NPO Pain/Anxiety/Delirium protocol (if indicated): xanax PO VAP protocol (if indicated): N/A DVT prophylaxis: heparin drip GI prophylaxis: protonix IV Glucose control: monitor Q 4 Mobility: bedrest Disposition:ICU  Goals of Care:  Last date of multidisciplinary goals of care discussion:03/17/2020 Family and staff present: APP, care RN & patient Summary of discussion: plan of care,  anticoagulation, thrombectomy today Follow up goals of care discussion due: 03/18/2020 Code Status: FULL  Labs   CBC: Recent Labs  Lab 03/16/20 0233 03/17/20 0323  WBC 12.0* 14.9*  NEUTROABS 9.7*  --   HGB 12.8 13.0  HCT 39.0 37.8  MCV 95.8 92.2  PLT 178 299    Basic Metabolic Panel: Recent Labs  Lab 03/16/20 0233 03/17/20 0323  NA 136 132*  K 4.3 4.1  CL 103 104  CO2 22 19*  GLUCOSE 226* 140*  BUN 27* 31*  CREATININE 1.34* 1.17*  CALCIUM 9.2 8.4*  MG  --  1.6*  PHOS  --  3.2   GFR: Estimated Creatinine Clearance: 54.5 mL/min (A) (by C-G formula based on SCr of 1.17 mg/dL (H)). Recent Labs  Lab 03/16/20 0233 03/17/20 0323  WBC 12.0* 14.9*    Liver Function Tests: Recent Labs  Lab 03/16/20 0233  AST 71*  ALT 51*  ALKPHOS 55  BILITOT 0.8  PROT 6.7  ALBUMIN 3.8   Recent Labs  Lab 03/16/20 0233  LIPASE 28   No results for input(s): AMMONIA in the last 168 hours.  ABG No results found for: PHART, PCO2ART, PO2ART, HCO3, TCO2, ACIDBASEDEF, O2SAT   Coagulation Profile: Recent Labs  Lab  03/16/20 0509  INR 1.1    Cardiac Enzymes: No results for input(s): CKTOTAL, CKMB, CKMBINDEX, TROPONINI in the last 168 hours.  HbA1C: Hgb A1c MFr Bld  Date/Time Value Ref Range Status  10/17/2019 11:11 AM 5.7 4.6 - 6.5 % Final    Comment:    Glycemic Control Guidelines for People with Diabetes:Non Diabetic:  <6%Goal of Therapy: <7%Additional Action Suggested:  >8%   04/17/2019 02:19 PM 5.7 4.6 - 6.5 % Final    Comment:    Glycemic Control Guidelines for People with Diabetes:Non Diabetic:  <6%Goal of Therapy: <7%Additional Action Suggested:  >8%     CBG: Recent Labs  Lab 03/16/20 0604  GLUCAP 154*    Review of Systems: Positives in BOLD  Gen: Denies fever, chills, weight change, fatigue, night sweats HEENT: Denies blurred vision, double vision, hearing loss, tinnitus, sinus congestion, rhinorrhea, sore throat, neck stiffness, dysphagia PULM: Denies shortness of breath, cough, sputum production, hemoptysis, wheezing CV: Denies chest pain, edema, orthopnea, paroxysmal nocturnal  dyspnea, palpitations, dyspnea with exertion, near syncope GI: Denies abdominal pain, nausea, vomiting, diarrhea, hematochezia, melena, constipation, change in bowel habits GU: Denies dysuria, hematuria, polyuria, oliguria, urethral discharge Endocrine: Denies hot or cold intolerance, polyuria, polyphagia or appetite change Derm: Denies rash, dry skin, scaling or peeling skin change Heme: Denies easy bruising, bleeding, bleeding gums Neuro: Denies headache, numbness, weakness, slurred speech, loss of memory or consciousness  Past Medical History:  She,  has a past medical history of Anxiety, Depression, Diverticulosis of colon, GERD (gastroesophageal reflux disease), History of colonoscopy, and Hypertension.   Surgical History:   Past Surgical History:  Procedure Laterality Date  . BREAST SURGERY     sterotactic breaswt biopsy: atypical cells,  continued diagnostic mammograms  . COLONOSCOPY    .  COLONOSCOPY WITH PROPOFOL N/A 02/21/2020   Procedure: COLONOSCOPY WITH PROPOFOL;  Surgeon: Virgel Manifold, MD;  Location: [facility] ENDOSCOPY;  Service: Endoscopy;  Laterality: N/A;     Social History:   reports that she has never smoked. She has never used smokeless tobacco. She reports current alcohol use of about 1.0 standard drink of alcohol per week. She reports that she does not use drugs.   Family History:  Her family history includes Cancer in her father, maternal uncle, and mother; Cancer (age of onset: 29) in her paternal grandmother; Heart disease (age of onset: 14) in her father; Stroke in her father.   Allergies Allergies  Allergen Reactions  . Clindamycin/Lincomycin Rash     Home Medications  Prior to Admission medications   Medication Sig Start Date End Date Taking? Authorizing Provider  acyclovir (ZOVIRAX) 400 MG tablet TAKE 1 TABLET (400 MG TOTAL) BY MOUTH EVERY 4 (FOUR) HOURS WHILE AWAKE. 10/23/19   Crecencio Mc, MD  aspirin 81 MG tablet Take 81 mg by mouth daily.    [provider]  atorvastatin (LIPITOR) 20 MG tablet TAKE 1 TABLET BY MOUTH EVERY DAY 08/22/19   Crecencio Mc, MD  azithromycin (ZITHROMAX) 250 MG tablet 2 pills day 1 and 1 pill day 2-5 with food Patient not taking: Reported on 02/21/2020 11/07/19   McLean-Scocuzza, Nino Glow, MD  benzonatate (TESSALON) 200 MG capsule Take 1 capsule (200 mg total) by mouth 3 (three) times daily as needed for cough. 11/07/19   McLean-Scocuzza, Nino Glow, MD  buPROPion (WELLBUTRIN XL) 150 MG 24 hr tablet TAKE 1 TABLET BY MOUTH EVERY DAY 11/25/19   Crecencio Mc, MD  diclofenac Sodium (VOLTAREN) 1 % GEL diclofenac 1 % topical gel 12/08/17   [provider]  fish oil-omega-3 fatty acids 1000 MG capsule Take 2 g by mouth daily.    [provider]  ipratropium (ATROVENT) 0.06 % nasal spray Place 2 sprays into both nostrils 3 (three) times daily. 11/07/19   McLean-Scocuzza, Nino Glow, MD  losartan (COZAAR)  50 MG tablet TAKE 1 TABLET BY MOUTH EVERY DAY 10/10/19   Crecencio Mc, MD  meloxicam [facility]) 15 MG tablet  04/01/19   [provider]  metoprolol succinate (TOPROL-XL) 25 MG 24 hr tablet TAKE 1 TABLET BY MOUTH EVERY DAY 10/18/19   Crecencio Mc, MD  Multiple Vitamin (MULTIVITAMIN) tablet Take 1 tablet by mouth daily.    [provider]  PREMARIN vaginal cream USE 1 TO 2 GRAMS INTRAVAGINALLY TWICE WEEKLY 07/10/18   Crecencio Mc, MD  sodium chloride (OCEAN) 0.65 % SOLN nasal spray Place 1 spray into both nostrils as needed for congestion. 11/07/19   McLean-Scocuzza, Olivia Mackie  N, MD     Critical care time: 35 minutes     Darel Hong, [facility] Pager: [phone]

## 2020-03-18 ENCOUNTER — Encounter: Payer: Self-pay | Admitting: Vascular Surgery

## 2020-03-18 DIAGNOSIS — E041 Nontoxic single thyroid nodule: Secondary | ICD-10-CM

## 2020-03-18 DIAGNOSIS — Z800 Family history of malignant neoplasm of digestive organs: Secondary | ICD-10-CM

## 2020-03-18 DIAGNOSIS — J9691 Respiratory failure, unspecified with hypoxia: Secondary | ICD-10-CM

## 2020-03-18 DIAGNOSIS — I2609 Other pulmonary embolism with acute cor pulmonale: Secondary | ICD-10-CM | POA: Diagnosis not present

## 2020-03-18 DIAGNOSIS — F32A Depression, unspecified: Secondary | ICD-10-CM

## 2020-03-18 DIAGNOSIS — I959 Hypotension, unspecified: Secondary | ICD-10-CM

## 2020-03-18 DIAGNOSIS — I4891 Unspecified atrial fibrillation: Secondary | ICD-10-CM | POA: Diagnosis not present

## 2020-03-18 DIAGNOSIS — Z803 Family history of malignant neoplasm of breast: Secondary | ICD-10-CM

## 2020-03-18 DIAGNOSIS — Z79899 Other long term (current) drug therapy: Secondary | ICD-10-CM

## 2020-03-18 DIAGNOSIS — E079 Disorder of thyroid, unspecified: Secondary | ICD-10-CM

## 2020-03-18 DIAGNOSIS — F419 Anxiety disorder, unspecified: Secondary | ICD-10-CM

## 2020-03-18 DIAGNOSIS — R7401 Elevation of levels of liver transaminase levels: Secondary | ICD-10-CM

## 2020-03-18 LAB — CARDIOLIPIN ANTIBODIES, IGG, IGM, IGA
Anticardiolipin IgA: <9 APL U/mL (ref 0–11)
Anticardiolipin IgG: <9 GPL U/mL (ref 0–14)
Anticardiolipin IgM: 11 MPL U/mL (ref 0–12)

## 2020-03-18 LAB — MAGNESIUM: Magnesium: 2.1 mg/dL (ref 1.7–2.4)

## 2020-03-18 LAB — COMPREHENSIVE METABOLIC PANEL
ALT: 330 U/L — ABNORMAL HIGH (ref 0–44)
AST: 197 U/L — ABNORMAL HIGH (ref 15–41)
Albumin: 3.3 g/dL — ABNORMAL LOW (ref 3.5–5.0)
Alkaline Phosphatase: 44 U/L (ref 38–126)
Anion gap: 5 (ref 5–15)
BUN: 25 mg/dL — ABNORMAL HIGH (ref 8–23)
CO2: 26 mmol/L (ref 22–32)
Calcium: 8.5 mg/dL — ABNORMAL LOW (ref 8.9–10.3)
Chloride: 106 mmol/L (ref 98–111)
Creatinine, Ser: 0.99 mg/dL (ref 0.44–1.00)
GFR, Estimated: >60 mL/min (ref >60)
Glucose, Bld: 110 mg/dL — ABNORMAL HIGH (ref 70–99)
Potassium: 3.9 mmol/L (ref 3.5–5.1)
Sodium: 137 mmol/L (ref 135–145)
Total Bilirubin: 0.8 mg/dL (ref 0.3–1.2)
Total Protein: 5.9 g/dL — ABNORMAL LOW (ref 6.5–8.1)

## 2020-03-18 LAB — BETA-2-GLYCOPROTEIN I ABS, IGG/M/A
Beta-2 Glyco I IgG: <9 GPI IgG units (ref 0–20)
Beta-2-Glycoprotein I IgA: <9 GPI IgA units (ref 0–25)
Beta-2-Glycoprotein I IgM: <9 GPI IgM units (ref 0–32)

## 2020-03-18 LAB — PROTEIN S ACTIVITY: Protein S Activity: 64 % (ref 63–140)

## 2020-03-18 LAB — CBC
HCT: 34.7 % — ABNORMAL LOW (ref 36.0–46.0)
Hemoglobin: 11.9 g/dL — ABNORMAL LOW (ref 12.0–15.0)
MCH: 32 pg (ref 26.0–34.0)
MCHC: 34.3 g/dL (ref 30.0–36.0)
MCV: 93.3 fL (ref 80.0–100.0)
Platelets: 168 10*3/uL (ref 150–400)
RBC: 3.72 MIL/uL — ABNORMAL LOW (ref 3.87–5.11)
RDW: 13.2 % (ref 11.5–15.5)
WBC: 12 10*3/uL — ABNORMAL HIGH (ref 4.0–10.5)
nRBC: 0.3 % — ABNORMAL HIGH (ref 0.0–0.2)

## 2020-03-18 LAB — HEPARIN LEVEL (UNFRACTIONATED)
Heparin Unfractionated: 0.35 IU/mL (ref 0.30–0.70)
Heparin Unfractionated: 0.25 IU/mL — ABNORMAL LOW (ref 0.30–0.70)
Heparin Unfractionated: 0.37 IU/mL (ref 0.30–0.70)

## 2020-03-18 LAB — PHOSPHORUS: Phosphorus: 2.7 mg/dL (ref 2.5–4.6)

## 2020-03-18 LAB — LUPUS ANTICOAGULANT PANEL
DRVVT: 34.4 s (ref 0.0–47.0)
PTT Lupus Anticoagulant: 34.3 s (ref 0.0–51.9)

## 2020-03-18 LAB — PROTEIN S, TOTAL: Protein S Ag, Total: 93 % (ref 60–150)

## 2020-03-18 LAB — PROTEIN C ACTIVITY: Protein C Activity: 116 % (ref 73–180)

## 2020-03-18 LAB — PROTEIN C, TOTAL: Protein C, Total: 105 % (ref 60–150)

## 2020-03-18 MED ORDER — HEPARIN BOLUS VIA INFUSION
1000.0000 [IU] | Freq: Once | INTRAVENOUS | Status: AC
  Administered 2020-03-18: 1000 [IU] via INTRAVENOUS
  Filled 2020-03-18: qty 1000

## 2020-03-18 MED ORDER — METOPROLOL TARTRATE 5 MG/5ML IV SOLN: 2.5000 mg | Freq: Four times a day (QID) | INTRAVENOUS | Status: DC | PRN

## 2020-03-18 NOTE — Progress Notes (Signed)
PROGRESS NOTE    Michele Garza  YSH:[id number] DOB: 25-Aug-1950 DOA: 03/16/2020 PCP: Crecencio Mc, MD    Brief Narrative:  Michele Garza is a 70 year old female with history of essential hypertension, morbid obesity, depression, who initially presents to the emergency room with a near syncope episode.  Her CT scan on 2/28 showed acute pulmonary embolus in both main arteries and a lobular arteries.  Evidence of right heart strain.  Also has bulky thyroid goiter the retrosternal extension and compression of the trachea.  Patient also had atrial fibrillation with RVR, she also developed acute hypoxemic respiratory failure with hypotension. Patient had thrombolysis and thrombectomy in multiple pulmonary arteries on 03/17/2020. Patient is transferred to medical floor on 03/17/2020. [facility] will pick up on 3/2  3/2- no overnight issues. On heparin gtt    Consultants:   Vascular surgery, PCCM  Procedures:  03/16/20 Cta chest PE>> positive for acute pulmonary embolus with clot in both main PAs and lobar arteries.  CT evidence of right heart strain RV to LV ratio suspicious for at least submassive PE > 1.5.   Trace pleural fluid and mild groundglass opacity no discrete pulmonary infarct.  Evidence also of nonspecific inflammation at the porta hepatis with small volume perihepatic ascites.   Questionable inflammation of the gallbladder.   Bulky thyroid goiter with retrosternal extension and moderate compression of the trachea at the thoracic inlet.  03/16/2020: Thyroid US>>Left-sided thyroid nodule/mass correlating with the nodule seen on preceding chest CT meets imaging criteria to recommend percutaneous sampling as indicated. 03/16/2020: Venous US Bilateral LE>>No evidence of DVT within either lower extremity. 03/16/2020: Echocardiogram>>1. Left ventricular ejection fraction, by estimation, is 55 to 60%. The  left ventricle has normal function. The left ventricle has no regional  wall  motion abnormalities. Left ventricular diastolic parameters are  consistent with Grade I diastolic  dysfunction (impaired relaxation).  2. Right ventricular systolic function is mildly reduced. The right  ventricular size is moderately enlarged. There is moderately elevated  pulmonary artery systolic pressure. The estimated right ventricular  systolic pressure is 11.5 mmHg.  3. The mitral valve is normal in structure. No evidence of mitral valve  regurgitation. No evidence of mitral stenosis.  4. The aortic valve is normal in structure. Aortic valve regurgitation is  not visualized. No aortic stenosis is present.  5. The inferior vena cava is dilated in size with <50% respiratory  variability, suggesting right atrial pressure of 15 mmHg    Antimicrobials:  03/16/2020 COVID-19 >> negative 03/16/2020 influenza A/B >> negative 03/16/20: HIV screen>>nonreactive 03/16/20: MRSA PCR>>negative      Subjective: Feeling better. No sob. No cp  Objective: Vitals:   03/18/20 0817 03/18/20 0900 03/18/20 1000 03/18/20 1100  BP:  120/67 113/64 112/61  Pulse:  93 86 81  Resp:  16 16 14   Temp:      TempSrc:      SpO2: 94% 95% 97% (!) 87%  Weight:      Height:        Intake/Output Summary (Last 24 hours) at 03/18/2020 1704 Last data filed at 03/18/2020 5208 Gross per 24 hour  Intake 608.95 ml  Output 1500 ml  Net -891.05 ml   Filed Weights   03/16/20 0602 03/17/20 0427 03/18/20 0500  Weight: 108.7 kg 109 kg 115 kg    Examination:  General exam: Appears calm and comfortable  Respiratory system: Clear to auscultation. Respiratory effort normal. Cardiovascular system: S1 & S2 heard, RRR. No JVD, murmurs, rubs,  gallops or clicks.  Gastrointestinal system: Abdomen is nondistended, soft and nontender. . Normal bowel sounds heard. Central nervous system: Alert and oriented.  Grossly intact Extremities: Trace edema Skin: Warm dry Psychiatry: Judgement and insight appear normal. Mood &  affect appropriate.     Data Reviewed: I have personally reviewed following labs and imaging studies  CBC: Recent Labs  Lab 03/16/20 0233 03/17/20 0323 03/18/20 0501  WBC 12.0* 14.9* 12.0*  NEUTROABS 9.7*  --   --   HGB 12.8 13.0 11.9*  HCT 39.0 37.8 34.7*  MCV 95.8 92.2 93.3  PLT 178 178 989   Basic Metabolic Panel: Recent Labs  Lab 03/16/20 0233 03/17/20 0323 03/18/20 0501  NA 136 132* 137  K 4.3 4.1 3.9  CL 103 104 106  CO2 22 19* 26  GLUCOSE 226* 140* 110*  BUN 27* 31* 25*  CREATININE 1.34* 1.17* 0.99  CALCIUM 9.2 8.4* 8.5*  MG  --  1.6* 2.1  PHOS  --  3.2 2.7   GFR: Estimated Creatinine Clearance: 66.5 mL/min (by C-G formula based on SCr of 0.99 mg/dL). Liver Function Tests: Recent Labs  Lab 03/16/20 0233 03/18/20 0501  AST 71* 197*  ALT 51* 330*  ALKPHOS 55 44  BILITOT 0.8 0.8  PROT 6.7 5.9*  ALBUMIN 3.8 3.3*   Recent Labs  Lab 03/16/20 0233  LIPASE 28   No results for input(s): AMMONIA in the last 168 hours. Coagulation Profile: Recent Labs  Lab 03/16/20 0509  INR 1.1   Cardiac Enzymes: No results for input(s): CKTOTAL, CKMB, CKMBINDEX, TROPONINI in the last 168 hours. BNP (last 3 results) No results for input(s): PROBNP in the last 8760 hours. HbA1C: No results for input(s): HGBA1C in the last 72 hours. CBG: Recent Labs  Lab 03/16/20 0604 03/17/20 1922  GLUCAP 154* 124*   Lipid Profile: No results for input(s): CHOL, HDL, LDLCALC, TRIG, CHOLHDL, LDLDIRECT in the last 72 hours. Thyroid Function Tests: Recent Labs    03/16/20 1105  TSH 0.555  T4TOTAL 8.6   Anemia Panel: No results for input(s): VITAMINB12, FOLATE, FERRITIN, TIBC, IRON, RETICCTPCT in the last 72 hours. Sepsis Labs: No results for input(s): PROCALCITON, LATICACIDVEN in the last 168 hours.  Recent Results (from the past 240 hour(s))  Resp Panel by RT-PCR (Flu A&B, Covid) Nasopharyngeal Swab     Status: None   Collection Time: 03/16/20  4:47 AM   Specimen:  Nasopharyngeal Swab; Nasopharyngeal(NP) swabs in vial transport medium  Result Value Ref Range Status   SARS Coronavirus 2 by RT PCR NEGATIVE NEGATIVE Final    Comment: (NOTE) SARS-CoV-2 target nucleic acids are NOT DETECTED.  The SARS-CoV-2 RNA is generally detectable in upper respiratory specimens during the acute phase of infection. The lowest concentration of SARS-CoV-2 viral copies this assay can detect is 138 copies/mL. A negative result does not preclude SARS-Cov-2 infection and should not be used as the sole basis for treatment or other patient management decisions. A negative result may occur with  improper specimen collection/handling, submission of specimen other than nasopharyngeal swab, presence of viral mutation(s) within the areas targeted by this assay, and inadequate number of viral copies(<138 copies/mL). A negative result must be combined with clinical observations, patient history, and epidemiological information. The expected result is Negative.  Fact Sheet for Patients:  EntrepreneurPulse.com.au  Fact Sheet for Healthcare Providers:  IncredibleEmployment.be  This test is no t yet approved or cleared by the [place] FDA and  has been authorized for detection  and/or diagnosis of SARS-CoV-2 by FDA under an Emergency Use Authorization (EUA). This EUA will remain  in effect (meaning this test can be used) for the duration of the COVID-19 declaration under Section 564(b)(1) of the Act, 21 U.S.C.section 360bbb-3(b)(1), unless the authorization is terminated  or revoked sooner.       Influenza A by PCR NEGATIVE NEGATIVE Final   Influenza B by PCR NEGATIVE NEGATIVE Final    Comment: (NOTE) The Xpert Xpress SARS-CoV-2/FLU/RSV plus assay is intended as an aid in the diagnosis of influenza from Nasopharyngeal swab specimens and should not be used as a sole basis for treatment. Nasal washings and aspirates are unacceptable for  Xpert Xpress SARS-CoV-2/FLU/RSV testing.  Fact Sheet for Patients: EntrepreneurPulse.com.au  Fact Sheet for Healthcare Providers: IncredibleEmployment.be  This test is not yet approved or cleared by the [place] FDA and has been authorized for detection and/or diagnosis of SARS-CoV-2 by FDA under an Emergency Use Authorization (EUA). This EUA will remain in effect (meaning this test can be used) for the duration of the COVID-19 declaration under Section 564(b)(1) of the Act, 21 U.S.C. section 360bbb-3(b)(1), unless the authorization is terminated or revoked.  Performed at [facility], [address] [place], [place] [address]   MRSA PCR Screening     Status: None   Collection Time: 03/16/20  6:24 AM   Specimen: Nasopharyngeal  Result Value Ref Range Status   MRSA by PCR NEGATIVE NEGATIVE Final    Comment:        The GeneXpert MRSA Assay (FDA approved for NASAL specimens only), is one component of a comprehensive MRSA colonization surveillance program. It is not intended to diagnose MRSA infection nor to guide or monitor treatment for MRSA infections. Performed at [facility], [address], [place],  [address]          Radiology Studies: PERIPHERAL VASCULAR CATHETERIZATION  Result Date: 03/17/2020 See op note       Scheduled Meds: . Chlorhexidine Gluconate Cloth  6 each Topical Daily  . metoprolol succinate  25 mg Oral Daily  . midodrine  5 mg Oral TID WC  . pantoprazole (PROTONIX) IV  40 mg Intravenous QHS   Continuous Infusions: . amiodarone    . heparin 1,000 Units/hr (03/18/20 1426)    Assessment & Plan:   Active Problems:   Pulmonary emboli (HCC)  Near Syncope in the setting of Submassive Pulmonary emboli Atrial Fibrillation w/ RVR>>resolved Acute hypoxic respiratory distress Hypotension Hx: HTN -s/p thrombectomy by vascular on 3/1 Echo with RV systolic function  mildly reduced, moderate elevated pulmonary artery systolic pressure On heparin drip, will need to be converted to p.o. Will need outpt follow up with hematology, hematology consulted- Dr. Mike Gip to see. Doppler US negative for b/l dvt Continue midodrine   AKI-baseline cr 0.90. on admission cr 1.34 Improved with ivf Avoid hypotension Avoid nephrotoxic meds    Transaminitis in the setting of nonspecific inflammation at the [place] gallbladder Continues to increase today. Will ck RUQ [place], npo at [place] If need to will consult GI tomorrow Avoid hepatotoxic agents   Hyperlipidemia - on statin, on hold due to transaminitis  Thyroid Mass Bulky thyroid goiter with retrosternal extension and moderate compression of the trachea at the thoracic inlet. - Thyroid panel normal (TSH 0.55, T4 8.6) -ENT following, appreciate input by Dr. Charolett Bumpers ~ will need an ultrasound-guided FNA when she is more stable, and will need removal of mass due to deviation of the  trachea and concern for potential causing airway problems in the future.  Anxiety -Xanax as needed     DVT prophylaxis: Heparin drip Code Status: Full Family Communication: None at bedside  Status is: Inpatient  Remains inpatient appropriate because:IV treatments appropriate due to intensity of illness or inability to take PO   Dispo: The patient is from: Home              Anticipated d/c is to: Home              Patient currently is not medically stable to d/c.   Difficult to place patient No               Possibly dc in 2-3 days, w/u pending            LOS: 2 days   Time spent: 35 minutes with more than 50% on [place], MD [facility] Pager [phone]  If 7PM-7AM, please contact night-coverage 03/18/2020, 5:04 PM

## 2020-03-18 NOTE — Progress Notes (Signed)
[place] for Heparin Infusion Indication: pulmonary embolus  Allergies  Allergen Reactions  . Clindamycin/Lincomycin Rash    Patient Measurements: Height: 5\' 3"  (160 cm) Weight: 115 kg (253 lb 8.5 oz) IBW/kg (Calculated) : 52.4 Heparin Dosing Weight: 78.9kg  Vital Signs: Temp: 98.3 F (36.8 C) (03/02 0400) Temp Source: Oral (03/02 0400) BP: 103/72 (03/01 2300) Pulse Rate: 135 (03/01 2300)  Labs: Recent Labs    03/16/20 0233 03/16/20 0509 03/16/20 1105 03/16/20 2001 03/17/20 0323 03/17/20 1216 03/17/20 2109 03/18/20 0501  HGB 12.8  --   --   --  13.0  --   --  11.9*  HCT 39.0  --   --   --  37.8  --   --  34.7*  PLT 178  --   --   --  178  --   --  168  APTT  --  27  --   --   --   --   --   --   LABPROT  --  14.0  --   --   --   --   --   --   INR  --  1.1  --   --   --   --   --   --   HEPARINUNFRC  --   --  1.13*   < > 0.93* 0.93* 0.26* 0.35  CREATININE 1.34*  --   --   --  1.17*  --   --  0.99  TROPONINIHS 215*  --  376*  --   --   --   --   --    < > = values in this interval not displayed.    Estimated Creatinine Clearance: 66.5 mL/min (by C-G formula based on SCr of 0.99 mg/dL).   Medical History: Past Medical History:  Diagnosis Date  . Anxiety   . Depression   . Diverticulosis of colon    one prior episode July 2011 caused by tomato overingestion  . GERD (gastroesophageal reflux disease)   . History of colonoscopy    done in  dec 2011, repeat due 5 yrs due to [place]  . Hypertension     Assessment: Pt is 70 yo female after near syncopal event with CT angio of chest was positive for pulmonary emboli with clot in both main pulmonary arteries & lobar arteries. Heparin Dosing Weight: 78.9kg   Patient is s/p thrombectomy. Consult to restart heparin following procedure.  Goal of Therapy:  Heparin level 0.3-0.7 units/ml Monitor platelets by anticoagulation protocol: Yes    Date Time HL Rate/comment 2/28 1105 1.13 1350 units/hr 2/28 2001 0.89 1100 units/hr 3/01     0323    0.93 950 units/hr  3/01 1216 0.93 Heparin running during lab draw 3/01 2109 0.26 800 units/hr 3/02 0501 0.35 900 units/hr, therapeutic x 1  Plan:  Heparin level therapeutic x 1, will continue Heparin at 900 units/hr  Will recheck HL in 6 hours to confirm CBC daily while on heparin drip.  Ena Dawley, PharmD Clinical Pharmacist 03/18/2020 6:07 AM

## 2020-03-18 NOTE — Progress Notes (Signed)
[place] for Heparin Infusion Indication: pulmonary embolus  Allergies  Allergen Reactions  . Clindamycin/Lincomycin Rash    Patient Measurements: Height: 5\' 3"  (160 cm) Weight: 115 kg (253 lb 8.5 oz) IBW/kg (Calculated) : 52.4 Heparin Dosing Weight: 78.9kg  Vital Signs: Temp: 98.3 F (36.8 C) (03/02 0400) Temp Source: Oral (03/02 0400) BP: 112/61 (03/02 1100) Pulse Rate: 81 (03/02 1100)  Labs: Recent Labs    03/16/20 0233 03/16/20 0509 03/16/20 1105 03/16/20 2001 03/17/20 0323 03/17/20 1216 03/17/20 2109 03/18/20 0501 03/18/20 1230  HGB 12.8  --   --   --  13.0  --   --  11.9*  --   HCT 39.0  --   --   --  37.8  --   --  34.7*  --   PLT 178  --   --   --  178  --   --  168  --   APTT  --  27  --   --   --   --   --   --   --   LABPROT  --  14.0  --   --   --   --   --   --   --   INR  --  1.1  --   --   --   --   --   --   --   HEPARINUNFRC  --   --  1.13*   < > 0.93*   < > 0.26* 0.35 0.25*  CREATININE 1.34*  --   --   --  1.17*  --   --  0.99  --   TROPONINIHS 215*  --  376*  --   --   --   --   --   --    < > = values in this interval not displayed.    Estimated Creatinine Clearance: 66.5 mL/min (by C-G formula based on SCr of 0.99 mg/dL).   Medical History: Past Medical History:  Diagnosis Date  . Anxiety   . Depression   . Diverticulosis of colon    one prior episode July 2011 caused by tomato overingestion  . GERD (gastroesophageal reflux disease)   . History of colonoscopy    done in  dec 2011, repeat due 5 yrs due to [place]  . Hypertension     Assessment: Pt is 70 yo female after near syncopal event with CT angio of chest was positive for pulmonary emboli with clot in both main pulmonary arteries & lobar arteries. Heparin Dosing Weight: 78.9kg   Patient is s/p thrombectomy. Consult to restart heparin following procedure.  Goal of Therapy:  Heparin level 0.3-0.7 units/ml Monitor platelets by  anticoagulation protocol: Yes   Date Time HL Rate/comment 2/28 1105 1.13 1350 units/hr 2/28 2001 0.89 1100 units/hr 3/01     0323    0.93 950 units/hr  3/01 1216 0.93 Heparin running during lab draw 3/01 2109 0.26 800 units/hr 3/02 0501 0.35 900 units/hr, therapeutic x 1 3/02 1230 0.25 900 units/hr  Plan:  Heparin level subtherapeutic. Heparin 1000 unit bolus followed by increase in rate to 1000 units/hr. Recheck HL at 2100. CBC daily while on heparin drip.  Tawnya Crook, PharmD Clinical Pharmacist 03/18/2020 2:21 PM

## 2020-03-18 NOTE — Consult Note (Signed)
[facility]  Date of admission:  03/16/2020  Inpatient day:  03/18/2020  Consulting physician: Dr Nolberto Hanlon  Reason for Consultation:  PE  Chief Complaint: Michele Garza is a 70 y.o. female who was admitted through the emergency room with bilateral pulmonary emboli.  HPI: The patient states that approximately 2 weeks ago she injured her left Achilles tendon and was in a boot.  Subsequently, she was not as active.  She developed shortness of breath and near syncope on 03/15/2020.  She presented to the emergency room via EMS.  Chest CT angiogram on 03/16/2020 revealed acute pulmonary emboli in both main pulmonary arteries and lobar arteries.  There was evidence of right heart strain suspicious for a submassive pulmonary embolism.  There was nonspecific inflammation at the porta hepatis with a small volume of perihepatic ascites.  There was a bulky thyroid goiter with retrosternal extension and moderate compression of the trachea at the thoracic inlet.  Bilateral lower extremity duplex on 03/16/2020 revealed no evidence of a DVT.  Echocardiogram on 03/16/2020 revealed an ejection fraction of 55-60% with right ventricular function mildly reduced ventricular size moderately enlarged.    She underwent thrombolysis and mechanical thrombectomy by Dr. Hortencia Pilar on 03/17/2020.  Notes indicate that she had atrial fibrillation with RVR and developed acute hypoxic respiratory failure with hypotension.  She has been in the ICU on a heparin drip.  She has been seen by Dr. Kathyrn Sheriff of ENT regarding her thyroid.  Thyroid ultrasound on 03/16/2020 revealed a left-sided thyroid nodule/mass correlating with the prior CT.  Recommendations were for a FNA for histopathology.  She will need to undergo thyroidectomy because of the deviation of the trachea and concern for airway problems in the future.  Surgery will be conducted when she can be off of blood thinners.  Thoracic surgery will need  to be involved as it extends in to the mediastinum and down to the aortic arch.    Initial CBC revealed a hematocrit of 39.0, hemoglobin 12.8, MCV 95.8, platelets 178,000, WBC 12,000 with an ANC of 9700.  Creatinine was 1.34.  LFTs included an AST of 71 and ALT of 51.  Hypercoagulable work-up included the following normal studies: lupus anticoagulant panel, anti-cardiolipin antibodies, beta-2 glycoprotein antibodies, protein C activity and antigen, protein S activity and antigen and antithrombin III activity.  Factor V Leiden and prothrombin gene mutation are pending.  Symptomatically, the patient is feeling better.  She denies any current shortness of breath.  She denies any systemic symptoms.  Colonoscopy on 02/21/2020 was negative.  Mammogram on 06/24/2019 at [facility] was negative.  She notes a family history of breast and colon cancer.  Her mother had breast cancer x2 (age 39 and 21).  Her father had colon cancer at age 64.  A paternal grandmother also had colon cancer at age 71.  She denies any family history of thrombosis.   Past Medical History:  Diagnosis Date  . Anxiety   . Depression   . Diverticulosis of colon    one prior episode July 2011 caused by tomato overingestion  . GERD (gastroesophageal reflux disease)   . History of colonoscopy    done in  dec 2011, repeat due 5 yrs due to [place]  . Hypertension     Past Surgical History:  Procedure Laterality Date  . BREAST SURGERY     sterotactic breaswt biopsy: atypical cells,  continued diagnostic mammograms  . COLONOSCOPY    . COLONOSCOPY WITH PROPOFOL N/A 02/21/2020  Procedure: COLONOSCOPY WITH PROPOFOL;  Surgeon: Virgel Manifold, MD;  Location: [facility] ENDOSCOPY;  Service: Endoscopy;  Laterality: N/A;  . PULMONARY THROMBECTOMY Bilateral 03/17/2020   Procedure: PULMONARY THROMBECTOMY / THROMBOLYSIS;  Surgeon: Katha Cabal, MD;  Location: [facility];  Service: Cardiovascular;  Laterality: Bilateral;    Family History   Problem Relation Age of Onset  . Cancer Mother        bilatera, contralateral recurred after 30 yrs  . Stroke Father   . Heart disease Father 31       died of stroke  at 39, tobacco user  . Cancer Father        colon Ca  . Cancer Paternal Grandmother 23       metastatic colon Ca  . Cancer Maternal Uncle        breast x 2 aunts    Social History:  reports that she has never smoked. She has never used smokeless tobacco. She reports current alcohol use of about 1.0 standard drink of alcohol per week. She reports that she does not use drugs.  The patient denies any exposure to radiation or toxins.  She retired in 01/2020.  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  The patient lives in [place].  She is alone today.  Allergies:  Allergies  Allergen Reactions  . Clindamycin/Lincomycin Rash    Medications Prior to Admission  Medication Sig Dispense Refill  . acyclovir (ZOVIRAX) 400 MG tablet TAKE 1 TABLET (400 MG TOTAL) BY MOUTH EVERY 4 (FOUR) HOURS WHILE AWAKE. 28 tablet 1  . aspirin 81 MG tablet Take 81 mg by mouth daily.    Marland Kitchen atorvastatin (LIPITOR) 20 MG tablet TAKE 1 TABLET BY MOUTH EVERY DAY 90 tablet 1  . buPROPion (WELLBUTRIN XL) 150 MG 24 hr tablet TAKE 1 TABLET BY MOUTH EVERY DAY 90 tablet 1  . Cholecalciferol (VITAMIN D3 PO) Take by mouth every evening.    . diclofenac Sodium (VOLTAREN) 1 % GEL Apply topically as needed.    Marland Kitchen esomeprazole (NEXIUM) 20 MG capsule Take 20 mg by mouth daily as needed.    . fish oil-omega-3 fatty acids 1000 MG capsule Take 2 g by mouth every evening.    Marland Kitchen losartan (COZAAR) 50 MG tablet TAKE 1 TABLET BY MOUTH EVERY DAY 90 tablet 1  . metoprolol succinate (TOPROL-XL) 25 MG 24 hr tablet TAKE 1 TABLET BY MOUTH EVERY DAY 90 tablet 1  . Multiple Vitamin (MULTIVITAMIN) tablet Take 1 tablet by mouth every evening.    Marland Kitchen PREMARIN vaginal cream USE 1 TO 2 GRAMS  INTRAVAGINALLY TWICE WEEKLY 30 g 2  . Turmeric (QC TUMERIC COMPLEX) 500 MG CAPS Take 500 mg by mouth every evening.    Marland Kitchen azithromycin (ZITHROMAX) 250 MG tablet 2 pills day 1 and 1 pill day 2-5 with food (Patient not taking: No sig reported) 6 tablet 0  . benzonatate (TESSALON) 200 MG capsule Take 1 capsule (200 mg total) by mouth 3 (three) times daily as needed for cough. (Patient not taking: Reported on 03/16/2020) 30 capsule 0  . ipratropium (ATROVENT) 0.06 % nasal spray Place 2 sprays into both nostrils 3 (three) times daily. (Patient not taking: Reported on 03/16/2020) 15 mL 0  . meloxicam (MOBIC) 15 MG tablet  (Patient not taking: Reported on 03/16/2020)    . sodium chloride (OCEAN) 0.65 % SOLN nasal spray Place 1 spray into  both nostrils as needed for congestion. (Patient not taking: Reported on 03/16/2020) 30 mL 2    Review of Systems  Constitutional: Negative.  Negative for chills, diaphoresis, fever, malaise/fatigue and weight loss.  HENT: Negative for congestion, ear pain, nosebleeds, sinus pain and sore throat.        Sinus issues due to allergies.  Eyes: Negative.  Negative for blurred vision, double vision and photophobia.  Respiratory: Positive for shortness of breath (improved). Negative for cough, hemoptysis, sputum production and wheezing.   Cardiovascular: Negative.  Negative for chest pain, palpitations, orthopnea, claudication and leg swelling.  Gastrointestinal: Positive for nausea (resolved). Negative for abdominal pain, blood in stool, constipation, diarrhea, heartburn, melena and vomiting.  Genitourinary: Negative.  Negative for dysuria, frequency, hematuria and urgency.  Musculoskeletal: Positive for joint pain (osteoarthritis in knees with "bone on bone"). Negative for back pain, myalgias and neck pain.       Partial left tendon rupture.  Skin: Negative.  Negative for itching and rash.  Neurological: Negative for dizziness, tingling, tremors, sensory change, speech change,  focal weakness, seizures, weakness and headaches.       Balance issues secondary to knees.  Endo/Heme/Allergies: Positive for environmental allergies. Does not bruise/bleed easily.  Psychiatric/Behavioral: Negative for depression and memory loss. The patient is nervous/anxious (anxiety secondary to recent events). The patient does not have insomnia.     Vitals:  Blood pressure 133/66, pulse (!) 105, temperature 98.3 F (36.8 C), temperature source Oral, resp. rate 12, height 5\' 3"  (1.6 m), weight 253 lb 8.5 oz (115 kg), SpO2 98 %.   Physical Exam Vitals and nursing note reviewed.  Constitutional:      Appearance: Normal appearance. She is not ill-appearing, toxic-appearing or diaphoretic.     Comments: Heavyset woman lying comfortably in the ICU in no acute distress.  HENT:     Head: Atraumatic.     Comments: Short red hair.    Nose:     Comments: [place] in place.    Mouth/Throat:     Mouth: Mucous membranes are moist.     Pharynx: Oropharynx is clear.  Eyes:     General: No scleral icterus.    Extraocular Movements: Extraocular movements intact.     Conjunctiva/sclera: Conjunctivae normal.     Pupils: Pupils are equal, round, and reactive to light.     Comments: Glasses.  Blue eyes.  Cardiovascular:     Rate and Rhythm: Normal rate and regular rhythm.  Pulmonary:     Effort: Pulmonary effort is normal. No respiratory distress.     Breath sounds: Normal breath sounds. No stridor. No wheezing, rhonchi or rales.  Chest:  Breasts:     Right: No axillary adenopathy or supraclavicular adenopathy.     Left: No axillary adenopathy or supraclavicular adenopathy.    Abdominal:     General: Bowel sounds are normal. There is no distension.     Palpations: There is no mass.     Tenderness: There is no abdominal tenderness. There is no guarding or rebound.  Musculoskeletal:     Cervical back: Neck supple. No rigidity or tenderness.     Right lower leg: No edema.     Left lower leg: No  edema.  Lymphadenopathy:     Head:     Right side of head: No preauricular, posterior auricular or occipital adenopathy.     Left side of head: No preauricular, posterior auricular or occipital adenopathy.     Cervical: No cervical adenopathy.  Upper Body:     Right upper body: No supraclavicular or axillary adenopathy.     Left upper body: No supraclavicular or axillary adenopathy.     Lower Body: No right inguinal adenopathy. No left inguinal adenopathy.  Skin:    General: Skin is warm and dry.     Coloration: Skin is not jaundiced or pale.     Findings: No bruising, erythema or rash.  Neurological:     General: No focal deficit present.     Mental Status: She is alert and oriented to person, place, and time.  Psychiatric:        Mood and Affect: Mood normal.        Behavior: Behavior normal.        Thought Content: Thought content normal.        Judgment: Judgment normal.       Results for orders placed or performed during the hospital encounter of 03/16/20 (from the past 48 hour(s))  CBC     Status: Abnormal   Collection Time: 03/17/20  3:23 AM  Result Value Ref Range   WBC 14.9 (H) 4.0 - 10.5 K/uL   RBC 4.10 3.87 - 5.11 MIL/uL   Hemoglobin 13.0 12.0 - 15.0 g/dL   HCT 37.8 36.0 - 46.0 %   MCV 92.2 80.0 - 100.0 fL   MCH 31.7 26.0 - 34.0 pg   MCHC 34.4 30.0 - 36.0 g/dL   RDW 13.4 11.5 - 15.5 %   Platelets 178 150 - 400 K/uL   nRBC 0.0 0.0 - 0.2 %    Comment: Performed at [facility], [address], [place], [place] [address]  Basic metabolic panel     Status: Abnormal   Collection Time: 03/17/20  3:23 AM  Result Value Ref Range   Sodium 132 (L) 135 - 145 mmol/L   Potassium 4.1 3.5 - 5.1 mmol/L   Chloride 104 98 - 111 mmol/L   CO2 19 (L) 22 - 32 mmol/L   Glucose, Bld 140 (H) 70 - 99 mg/dL    Comment: Glucose reference range applies only to samples taken after fasting for at least 8 hours.   BUN 31 (H) 8 - 23 mg/dL   Creatinine, Ser 1.17 (H) 0.44 -  1.00 mg/dL   Calcium 8.4 (L) 8.9 - 10.3 mg/dL   GFR, Estimated 51 (L) >60 mL/min    Comment: (NOTE) Calculated using the CKD-EPI Creatinine Equation (2021)    Anion gap 9 5 - 15    Comment: Performed at [facility], [address] [place], [place] [address]  Heparin level (unfractionated)     Status: Abnormal   Collection Time: 03/17/20  3:23 AM  Result Value Ref Range   Heparin Unfractionated 0.93 (H) 0.30 - 0.70 IU/mL    Comment: (NOTE) If heparin results are below expected values, and patient dosage has  been confirmed, suggest follow up testing of antithrombin III levels. Performed at [facility], [address] [place], [place] [address]   Magnesium     Status: Abnormal   Collection Time: 03/17/20  3:23 AM  Result Value Ref Range   Magnesium 1.6 (L) 1.7 - 2.4 mg/dL    Comment: Performed at [facility], [address] [place], [place] [address]  Phosphorus     Status: None   Collection Time: 03/17/20  3:23 AM  Result Value Ref Range   Phosphorus 3.2 2.5 - 4.6 mg/dL    Comment: Performed at  [facility], [place], [place] [address]  Heparin level (unfractionated)     Status: Abnormal   Collection Time: 03/17/20 12:16 PM  Result Value Ref Range   Heparin Unfractionated 0.93 (H) 0.30 - 0.70 IU/mL    Comment: (NOTE) If heparin results are below expected values, and patient dosage has  been confirmed, suggest follow up testing of antithrombin III levels. Performed at [facility], [address] [place], [place] [address]   Glucose, capillary     Status: Abnormal   Collection Time: 03/17/20  7:22 PM  Result Value Ref Range   Glucose-Capillary 124 (H) 70 - 99 mg/dL    Comment: Glucose reference range applies only to samples taken after fasting for at least 8 hours.  Heparin level (unfractionated)     Status: Abnormal   Collection Time: 03/17/20  9:09 PM  Result Value Ref Range   Heparin Unfractionated  0.26 (L) 0.30 - 0.70 IU/mL    Comment: (NOTE) If heparin results are below expected values, and patient dosage has  been confirmed, suggest follow up testing of antithrombin III levels. Performed at [facility], [address] [place], [place] [address]   CBC     Status: Abnormal   Collection Time: 03/18/20  5:01 AM  Result Value Ref Range   WBC 12.0 (H) 4.0 - 10.5 K/uL   RBC 3.72 (L) 3.87 - 5.11 MIL/uL   Hemoglobin 11.9 (L) 12.0 - 15.0 g/dL   HCT 34.7 (L) 36.0 - 46.0 %   MCV 93.3 80.0 - 100.0 fL   MCH 32.0 26.0 - 34.0 pg   MCHC 34.3 30.0 - 36.0 g/dL   RDW 13.2 11.5 - 15.5 %   Platelets 168 150 - 400 K/uL   nRBC 0.3 (H) 0.0 - 0.2 %    Comment: Performed at [facility], [address], [place], [place] [address]  Comprehensive metabolic panel     Status: Abnormal   Collection Time: 03/18/20  5:01 AM  Result Value Ref Range   Sodium 137 135 - 145 mmol/L   Potassium 3.9 3.5 - 5.1 mmol/L   Chloride 106 98 - 111 mmol/L   CO2 26 22 - 32 mmol/L   Glucose, Bld 110 (H) 70 - 99 mg/dL    Comment: Glucose reference range applies only to samples taken after fasting for at least 8 hours.   BUN 25 (H) 8 - 23 mg/dL   Creatinine, Ser 0.99 0.44 - 1.00 mg/dL   Calcium 8.5 (L) 8.9 - 10.3 mg/dL   Total Protein 5.9 (L) 6.5 - 8.1 g/dL   Albumin 3.3 (L) 3.5 - 5.0 g/dL   AST 197 (H) 15 - 41 U/L   ALT 330 (H) 0 - 44 U/L   Alkaline Phosphatase 44 38 - 126 U/L   Total Bilirubin 0.8 0.3 - 1.2 mg/dL   GFR, Estimated >60 >60 mL/min    Comment: (NOTE) Calculated using the CKD-EPI Creatinine Equation (2021)    Anion gap 5 5 - 15    Comment: Performed at [facility], [address] [place], [place] [address]  Magnesium     Status: None   Collection Time: 03/18/20  5:01 AM  Result Value Ref Range   Magnesium 2.1 1.7 - 2.4 mg/dL    Comment: Performed at [facility], [address] [place], [place] [address]  Phosphorus     Status: None   Collection Time:  03/18/20  5:01 AM  Result Value Ref Range   Phosphorus 2.7 2.5 - 4.6 mg/dL    Comment: Performed at [facility], [place], [place] [address]  Heparin level (unfractionated)     Status: None   Collection Time: 03/18/20  5:01 AM  Result Value Ref Range   Heparin Unfractionated 0.35 0.30 - 0.70 IU/mL    Comment: (NOTE) If heparin results are below expected values, and patient dosage has  been confirmed, suggest follow up testing of antithrombin III levels. Performed at [facility], [place], [place] [address]   Heparin level (unfractionated)     Status: Abnormal   Collection Time: 03/18/20 12:30 PM  Result Value Ref Range   Heparin Unfractionated 0.25 (L) 0.30 - 0.70 IU/mL    Comment: (NOTE) If heparin results are below expected values, and patient dosage has  been confirmed, suggest follow up testing of antithrombin III levels. Performed at [facility], [address] [place],  [address]    PERIPHERAL VASCULAR CATHETERIZATION  Result Date: 03/17/2020 See op note   Assessment:  The patient is a 71 y.o. woman with bilateral pulmonary emboli s/p thrombolysis and mechanical thrombectomy on 03/17/2020.  Prior to diagnosis activity level had declined secondary to left Achilles tendon injury.  Chest CT angiogram on 03/16/2020 revealed acute pulmonary emboli in both main pulmonary arteries and lobar arteries.  There was evidence of right heart strain suspicious for a submassive pulmonary embolism.  There was nonspecific inflammation at the porta hepatis with a small volume of perihepatic ascites.  There was a bulky thyroid goiter goiter with retrosternal extension and moderate compression of the trachea at the thoracic inlet.  Bilateral lower extremity duplex on 03/16/2020 revealed no evidence of a DVT.  Echo on 03/16/2020 revealed an ejection fraction of 55-60% with right ventricular function mildly reduced ventricular  size moderately enlarged.    Hypercoagulable work-up included the following normal studies: CBC, lupus anticoagulant panel, anti-cardiolipin antibodies, beta-2 glycoprotein antibodies, protein C activity and antigen, protein S activity and antigen and antithrombin III activity.  Factor V Leiden and prothrombin gene mutation are pending.  Colonoscopy on 02/21/2020 was negative.  Mammogram on 06/24/2019 at [facility] was negative.  She has a family history of breast and colon cancer.  Her mother had breast cancer x2 (age 69 and 75).  Her father had colon cancer at age 67.  A paternal grandmother also had colon cancer at age 14.  She denies any family history of thrombosis.  Symptomatically, the patient is feeling better.  She denies any current shortness of breath.   Plan:   1.   Bilateral pulmonary emboli  Precipitating factors likely secondary to inactivity and weight.  Discuss risk factors for thrombosis (pregnancy, obesity, malignancy, dehydration, pelvic/lower extremity surgery, immobility, clotting deficiencies).  Discuss analogy of a basket floating on the water.  Risk factors for thrombosis are various sized rocks in the basket.   Enough rocks in the basket and the boat sinks.  Sinking represents thrombosis.  Discuss eliminating risk factors that can be controlled.  Hypercoagulable work-up is negative to date.  Factor V Leiden and prothrombin gene mutation are pending.   She is currently on heparin.  Anticipate transition to Eliquis or Xarelto.  Discuss follow-up in the hematology clinic in 2 weeks.  Several questions were asked and answered.  Thank you for allowing me to participate in Michele Garza 's care.  I will follow her closely with you while hospitalized and after  discharge in the outpatient department.   Lequita Asal, MD  03/18/2020, 9:02 PM

## 2020-03-18 NOTE — Progress Notes (Signed)
[facility] Daily Progress Note  Subjective: 03/17/20             1.  Contrast injection right heart             2.  Thrombolysis bilateral right middle and lower lobe pulmonary arteries and left upper and lower lobe pulmonary arteries             3.  Mechanical thrombectomy multiple lobar pulmonary arteries             4.  Selective catheter placement right lower and middle pulmonary arteries             5.  Selective catheter placement left upper and lower pulmonary arteries  Patient without complaint this AM.  States she is breathing better.  No issues overnight.  Objective: Vitals:   03/18/20 0817 03/18/20 0900 03/18/20 1000 03/18/20 1100  BP:  120/67 113/64 112/61  Pulse:  93 86 81  Resp:  16 16 14   Temp:      TempSrc:      SpO2: 94% 95% 97% (!) 87%  Weight:      Height:        Intake/Output Summary (Last 24 hours) at 03/18/2020 1224 Last data filed at 03/18/2020 9323 Gross per 24 hour  Intake 608.95 ml  Output 1850 ml  Net -1241.05 ml   Physical Exam: A&Ox3, NAD CV: RRR Pulmonary: CTA Bilaterally Abdomen: Soft, Non-tender, Non-distended Access site:   PAD intact.  No drainage or swelling Vascular: Warm distally to toes   Laboratory: CBC    Component Value Date/Time   WBC 12.0 (H) 03/18/2020 0501   HGB 11.9 (L) 03/18/2020 0501   HCT 34.7 (L) 03/18/2020 0501   PLT 168 03/18/2020 0501   BMET    Component Value Date/Time   NA 137 03/18/2020 0501   K 3.9 03/18/2020 0501   CL 106 03/18/2020 0501   CO2 26 03/18/2020 0501   GLUCOSE 110 (H) 03/18/2020 0501   BUN 25 (H) 03/18/2020 0501   CREATININE 0.99 03/18/2020 0501   CREATININE 0.91 09/07/2018 1641   CALCIUM 8.5 (L) 03/18/2020 0501   GFRNONAA >60 03/18/2020 0501   Assessment/Planning: The patient is a 70 year old female with multiple medical issues who presented with PE status post pulmonary thrombectomy/thrombolysis  1) okay from a vascular standpoint okay to transition to oral  anticoagulation.  Unsure as to when ENT may want to biopsy the patient's goiter.  2) from a vascular standpoint, the patient can be discharged home when medically stable. 3) at this point, no further recommendations from vascular surgery.  Will sign off.  Discussed with Dr. Eber Hong Jodiann Ognibene PA-C 03/18/2020 12:24 PM

## 2020-03-18 NOTE — Progress Notes (Signed)
[facility] for Heparin Infusion Indication: pulmonary embolus  Allergies  Allergen Reactions  . Clindamycin/Lincomycin Rash    Patient Measurements: Height: 5\' 3"  (160 cm) Weight: 115 kg (253 lb 8.5 oz) IBW/kg (Calculated) : 52.4 Heparin Dosing Weight: 78.9kg  Vital Signs: BP: 128/72 (03/02 2100) Pulse Rate: 91 (03/02 2130)  Labs: Recent Labs    03/16/20 0233 03/16/20 0509 03/16/20 1105 03/16/20 2001 03/17/20 0323 03/17/20 1216 03/18/20 0501 03/18/20 1230 03/18/20 2131  HGB 12.8  --   --   --  13.0  --  11.9*  --   --   HCT 39.0  --   --   --  37.8  --  34.7*  --   --   PLT 178  --   --   --  178  --  168  --   --   APTT  --  27  --   --   --   --   --   --   --   LABPROT  --  14.0  --   --   --   --   --   --   --   INR  --  1.1  --   --   --   --   --   --   --   HEPARINUNFRC  --   --  1.13*   < > 0.93*   < > 0.35 0.25* 0.37  CREATININE 1.34*  --   --   --  1.17*  --  0.99  --   --   TROPONINIHS 215*  --  376*  --   --   --   --   --   --    < > = values in this interval not displayed.    Estimated Creatinine Clearance: 66.5 mL/min (by C-G formula based on SCr of 0.99 mg/dL).   Medical History: Past Medical History:  Diagnosis Date  . Anxiety   . Depression   . Diverticulosis of colon    one prior episode July 2011 caused by tomato overingestion  . GERD (gastroesophageal reflux disease)   . History of colonoscopy    done in  dec 2011, repeat due 5 yrs due to [place]  . Hypertension     Assessment: Pt is 70 yo female after near syncopal event with CT angio of chest was positive for pulmonary emboli with clot in both main pulmonary arteries & lobar arteries. Heparin Dosing Weight: 78.9kg   Patient is s/p thrombectomy. Consult to restart heparin following procedure.  Goal of Therapy:  Heparin level 0.3-0.7 units/ml Monitor platelets by anticoagulation protocol: Yes   Date Time HL Rate/comment 2/28 1105 1.13 1350  units/hr 2/28 2001 0.89 1100 units/hr 3/01     0323    0.93 950 units/hr  3/01 1216 0.93 Heparin running during lab draw 3/01 2109 0.26 800 units/hr 3/02 0501 0.35 900 units/hr, therapeutic x 1 3/02 1230 0.25 900 units/hr 3/02 2131 0.37 1000 units/hr  Plan:  Heparin level therapeutic x 1. Continue Heparin infusion at 1000 units/hr.  Recheck HL in am to confirm. CBC daily while on heparin drip.  Ena Dawley, PharmD Clinical Pharmacist 03/18/2020 11:26 PM

## 2020-03-19 ENCOUNTER — Inpatient Hospital Stay: Payer: Medicare PPO

## 2020-03-19 DIAGNOSIS — I10 Essential (primary) hypertension: Secondary | ICD-10-CM

## 2020-03-19 DIAGNOSIS — I4891 Unspecified atrial fibrillation: Secondary | ICD-10-CM | POA: Diagnosis not present

## 2020-03-19 DIAGNOSIS — Z803 Family history of malignant neoplasm of breast: Secondary | ICD-10-CM

## 2020-03-19 DIAGNOSIS — I2609 Other pulmonary embolism with acute cor pulmonale: Secondary | ICD-10-CM | POA: Diagnosis not present

## 2020-03-19 DIAGNOSIS — J9691 Respiratory failure, unspecified with hypoxia: Secondary | ICD-10-CM

## 2020-03-19 DIAGNOSIS — I959 Hypotension, unspecified: Secondary | ICD-10-CM | POA: Diagnosis not present

## 2020-03-19 DIAGNOSIS — E041 Nontoxic single thyroid nodule: Secondary | ICD-10-CM

## 2020-03-19 DIAGNOSIS — Z800 Family history of malignant neoplasm of digestive organs: Secondary | ICD-10-CM

## 2020-03-19 DIAGNOSIS — F32A Depression, unspecified: Secondary | ICD-10-CM

## 2020-03-19 DIAGNOSIS — Z79899 Other long term (current) drug therapy: Secondary | ICD-10-CM

## 2020-03-19 LAB — CBC
HCT: 31.7 % — ABNORMAL LOW (ref 36.0–46.0)
Hemoglobin: 10.6 g/dL — ABNORMAL LOW (ref 12.0–15.0)
MCH: 31.9 pg (ref 26.0–34.0)
MCHC: 33.4 g/dL (ref 30.0–36.0)
MCV: 95.5 fL (ref 80.0–100.0)
Platelets: 160 10*3/uL (ref 150–400)
RBC: 3.32 MIL/uL — ABNORMAL LOW (ref 3.87–5.11)
RDW: 13.2 % (ref 11.5–15.5)
WBC: 11.7 10*3/uL — ABNORMAL HIGH (ref 4.0–10.5)
nRBC: 0.2 % (ref 0.0–0.2)

## 2020-03-19 LAB — COMPREHENSIVE METABOLIC PANEL
ALT: 337 U/L — ABNORMAL HIGH (ref 0–44)
AST: 176 U/L — ABNORMAL HIGH (ref 15–41)
Albumin: 3.3 g/dL — ABNORMAL LOW (ref 3.5–5.0)
Alkaline Phosphatase: 52 U/L (ref 38–126)
Anion gap: 8 (ref 5–15)
BUN: 19 mg/dL (ref 8–23)
CO2: 26 mmol/L (ref 22–32)
Calcium: 8.6 mg/dL — ABNORMAL LOW (ref 8.9–10.3)
Chloride: 106 mmol/L (ref 98–111)
Creatinine, Ser: 0.85 mg/dL (ref 0.44–1.00)
GFR, Estimated: >60 mL/min (ref >60)
Glucose, Bld: 111 mg/dL — ABNORMAL HIGH (ref 70–99)
Potassium: 3.6 mmol/L (ref 3.5–5.1)
Sodium: 140 mmol/L (ref 135–145)
Total Bilirubin: 0.8 mg/dL (ref 0.3–1.2)
Total Protein: 5.9 g/dL — ABNORMAL LOW (ref 6.5–8.1)

## 2020-03-19 LAB — MAGNESIUM: Magnesium: 1.9 mg/dL (ref 1.7–2.4)

## 2020-03-19 LAB — PHOSPHORUS: Phosphorus: 2.7 mg/dL (ref 2.5–4.6)

## 2020-03-19 LAB — HEPARIN LEVEL (UNFRACTIONATED): Heparin Unfractionated: 0.3 IU/mL (ref 0.30–0.70)

## 2020-03-19 LAB — PROTHROMBIN GENE MUTATION

## 2020-03-19 MED ORDER — BUPROPION HCL ER (XL) 150 MG PO TB24
150.0000 mg | ORAL_TABLET | Freq: Every day | ORAL | Status: DC
  Administered 2020-03-19 – 2020-03-20 (×2): 150 mg via ORAL
  Filled 2020-03-19 (×2): qty 1

## 2020-03-19 MED ORDER — APIXABAN 5 MG PO TABS: 5.0000 mg | ORAL_TABLET | Freq: Two times a day (BID) | ORAL | Status: DC

## 2020-03-19 MED ORDER — APIXABAN 5 MG PO TABS
10.0000 mg | ORAL_TABLET | Freq: Two times a day (BID) | ORAL | Status: DC
  Administered 2020-03-19 – 2020-03-20 (×2): 10 mg via ORAL
  Filled 2020-03-19 (×2): qty 2

## 2020-03-19 NOTE — Progress Notes (Signed)
PROGRESS NOTE    Michele Garza  BPZ:[id number] DOB: Nov 05, 1950 DOA: 03/16/2020 PCP: Crecencio Mc, MD    Brief Narrative:  Michele Garza is a 70 year old female with history of essential hypertension, morbid obesity, depression, who initially presents to the emergency room with a near syncope episode.  Her CT scan on 2/28 showed acute pulmonary embolus in both main arteries and a lobular arteries.  Evidence of right heart strain.  Also has bulky thyroid goiter the retrosternal extension and compression of the trachea.  Patient also had atrial fibrillation with RVR, she also developed acute hypoxemic respiratory failure with hypotension. Patient had thrombolysis and thrombectomy in multiple pulmonary arteries on 03/17/2020. Patient is transferred to medical floor on 03/17/2020. [facility] will pick up on 3/2  3/2- no overnight issues. On heparin gtt 3/3 no issues. Denies sob or cp.   Consultants:   Vascular surgery, PCCM  Procedures:  03/16/20 Cta chest PE>> positive for acute pulmonary embolus with clot in both main PAs and lobar arteries.  CT evidence of right heart strain RV to LV ratio suspicious for at least submassive PE > 1.5.   Trace pleural fluid and mild groundglass opacity no discrete pulmonary infarct.  Evidence also of nonspecific inflammation at the porta hepatis with small volume perihepatic ascites.   Questionable inflammation of the gallbladder.   Bulky thyroid goiter with retrosternal extension and moderate compression of the trachea at the thoracic inlet.  03/16/2020: Thyroid US>>Left-sided thyroid nodule/mass correlating with the nodule seen on preceding chest CT meets imaging criteria to recommend percutaneous sampling as indicated. 03/16/2020: Venous US Bilateral LE>>No evidence of DVT within either lower extremity. 03/16/2020: Echocardiogram>>1. Left ventricular ejection fraction, by estimation, is 55 to 60%. The  left ventricle has normal function. The left  ventricle has no regional  wall motion abnormalities. Left ventricular diastolic parameters are  consistent with Grade I diastolic  dysfunction (impaired relaxation).  2. Right ventricular systolic function is mildly reduced. The right  ventricular size is moderately enlarged. There is moderately elevated  pulmonary artery systolic pressure. The estimated right ventricular  systolic pressure is 24.2 mmHg.  3. The mitral valve is normal in structure. No evidence of mitral valve  regurgitation. No evidence of mitral stenosis.  4. The aortic valve is normal in structure. Aortic valve regurgitation is  not visualized. No aortic stenosis is present.  5. The inferior vena cava is dilated in size with <50% respiratory  variability, suggesting right atrial pressure of 15 mmHg    Antimicrobials:  03/16/2020 COVID-19 >> negative 03/16/2020 influenza A/B >> negative 03/16/20: HIV screen>>nonreactive 03/16/20: MRSA PCR>>negative      Subjective: Feeling better. No sob. No cp  Objective: Vitals:   03/19/20 1200 03/19/20 1300 03/19/20 1400 03/19/20 1500  BP: 126/71 135/69 116/67   Pulse: 85 90 92 93  Resp: 15 (!) 27 19 16   Temp: (!) 97 F (36.1 C)     TempSrc: Axillary     SpO2: 95% 94% 93% 94%  Weight:      Height:        Intake/Output Summary (Last 24 hours) at 03/19/2020 1643 Last data filed at 03/19/2020 1414 Gross per 24 hour  Intake 447.52 ml  Output 5500 ml  Net -5052.48 ml   Filed Weights   03/16/20 0602 03/17/20 0427 03/18/20 0500  Weight: 108.7 kg 109 kg 115 kg    Examination:  Calm, comfortable nontachypneic CTA, no wheeze rales rhonchi's Regular S1-S2 no gallops Soft benign positive bowel  sounds Trace edema bilaterally Alert oriented x3 grossly intact Mood and affect appropriate in current setting  Data Reviewed: I have personally reviewed following labs and imaging studies  CBC: Recent Labs  Lab 03/16/20 0233 03/17/20 0323 03/18/20 0501  03/19/20 0436  WBC 12.0* 14.9* 12.0* 11.7*  NEUTROABS 9.7*  --   --   --   HGB 12.8 13.0 11.9* 10.6*  HCT 39.0 37.8 34.7* 31.7*  MCV 95.8 92.2 93.3 95.5  PLT 178 178 168 573   Basic Metabolic Panel: Recent Labs  Lab 03/16/20 0233 03/17/20 0323 03/18/20 0501 03/19/20 0436  NA 136 132* 137 140  K 4.3 4.1 3.9 3.6  CL 103 104 106 106  CO2 22 19* 26 26  GLUCOSE 226* 140* 110* 111*  BUN 27* 31* 25* 19  CREATININE 1.34* 1.17* 0.99 0.85  CALCIUM 9.2 8.4* 8.5* 8.6*  MG  --  1.6* 2.1 1.9  PHOS  --  3.2 2.7 2.7   GFR: Estimated Creatinine Clearance: 77.4 mL/min (by C-G formula based on SCr of 0.85 mg/dL). Liver Function Tests: Recent Labs  Lab 03/16/20 0233 03/18/20 0501 03/19/20 0436  AST 71* 197* 176*  ALT 51* 330* 337*  ALKPHOS 55 44 52  BILITOT 0.8 0.8 0.8  PROT 6.7 5.9* 5.9*  ALBUMIN 3.8 3.3* 3.3*   Recent Labs  Lab 03/16/20 0233  LIPASE 28   No results for input(s): AMMONIA in the last 168 hours. Coagulation Profile: Recent Labs  Lab 03/16/20 0509  INR 1.1   Cardiac Enzymes: No results for input(s): CKTOTAL, CKMB, CKMBINDEX, TROPONINI in the last 168 hours. BNP (last 3 results) No results for input(s): PROBNP in the last 8760 hours. HbA1C: No results for input(s): HGBA1C in the last 72 hours. CBG: Recent Labs  Lab 03/16/20 0604 03/17/20 1922  GLUCAP 154* 124*   Lipid Profile: No results for input(s): CHOL, HDL, LDLCALC, TRIG, CHOLHDL, LDLDIRECT in the last 72 hours. Thyroid Function Tests: No results for input(s): TSH, T4TOTAL, FREET4, T3FREE, THYROIDAB in the last 72 hours. Anemia Panel: No results for input(s): VITAMINB12, FOLATE, FERRITIN, TIBC, IRON, RETICCTPCT in the last 72 hours. Sepsis Labs: No results for input(s): PROCALCITON, LATICACIDVEN in the last 168 hours.  Recent Results (from the past 240 hour(s))  Resp Panel by RT-PCR (Flu A&B, Covid) Nasopharyngeal Swab     Status: None   Collection Time: 03/16/20  4:47 AM   Specimen:  Nasopharyngeal Swab; Nasopharyngeal(NP) swabs in vial transport medium  Result Value Ref Range Status   SARS Coronavirus 2 by RT PCR NEGATIVE NEGATIVE Final    Comment: (NOTE) SARS-CoV-2 target nucleic acids are NOT DETECTED.  The SARS-CoV-2 RNA is generally detectable in upper respiratory specimens during the acute phase of infection. The lowest concentration of SARS-CoV-2 viral copies this assay can detect is 138 copies/mL. A negative result does not preclude SARS-Cov-2 infection and should not be used as the sole basis for treatment or other patient management decisions. A negative result may occur with  improper specimen collection/handling, submission of specimen other than nasopharyngeal swab, presence of viral mutation(s) within the areas targeted by this assay, and inadequate number of viral copies(<138 copies/mL). A negative result must be combined with clinical observations, patient history, and epidemiological information. The expected result is Negative.  Fact Sheet for Patients:  EntrepreneurPulse.com.au  Fact Sheet for Healthcare Providers:  IncredibleEmployment.be  This test is no t yet approved or cleared by the [place] FDA and  has been authorized for detection  and/or diagnosis of SARS-CoV-2 by FDA under an Emergency Use Authorization (EUA). This EUA will remain  in effect (meaning this test can be used) for the duration of the COVID-19 declaration under Section 564(b)(1) of the Act, 21 U.S.C.section 360bbb-3(b)(1), unless the authorization is terminated  or revoked sooner.       Influenza A by PCR NEGATIVE NEGATIVE Final   Influenza B by PCR NEGATIVE NEGATIVE Final    Comment: (NOTE) The Xpert Xpress SARS-CoV-2/FLU/RSV plus assay is intended as an aid in the diagnosis of influenza from Nasopharyngeal swab specimens and should not be used as a sole basis for treatment. Nasal washings and aspirates are unacceptable for  Xpert Xpress SARS-CoV-2/FLU/RSV testing.  Fact Sheet for Patients: EntrepreneurPulse.com.au  Fact Sheet for Healthcare Providers: IncredibleEmployment.be  This test is not yet approved or cleared by the [place] FDA and has been authorized for detection and/or diagnosis of SARS-CoV-2 by FDA under an Emergency Use Authorization (EUA). This EUA will remain in effect (meaning this test can be used) for the duration of the COVID-19 declaration under Section 564(b)(1) of the Act, 21 U.S.C. section 360bbb-3(b)(1), unless the authorization is terminated or revoked.  Performed at [facility], [address] [place], [place] [address]   MRSA PCR Screening     Status: None   Collection Time: 03/16/20  6:24 AM   Specimen: Nasopharyngeal  Result Value Ref Range Status   MRSA by PCR NEGATIVE NEGATIVE Final    Comment:        The GeneXpert MRSA Assay (FDA approved for NASAL specimens only), is one component of a comprehensive MRSA colonization surveillance program. It is not intended to diagnose MRSA infection nor to guide or monitor treatment for MRSA infections. Performed at [facility], [address], [place], [place] [address]          Radiology Studies: US Abdomen Limited RUQ (LIVER/GB)  Result Date: 03/19/2020 CLINICAL DATA:  Elevated LFTs. EXAM: ULTRASOUND ABDOMEN LIMITED RIGHT UPPER QUADRANT COMPARISON:  No prior. FINDINGS: Gallbladder: 4.6 mm gallstone noted. Gallbladder wall thickness normal. Negative Murphy sign. Common bile duct: Diameter: 2.5 mm Liver: Mild increased echogenicity of the liver. Mild fatty infiltration cannot be excluded. Portal vein is patent on color Doppler imaging with normal direction of blood flow towards the liver. Other: None. IMPRESSION: 1 4.6 mm gallstone. Gallbladder wall thickness normal. Negative Murphy sign. 2. Mild increased echogenicity of the liver. Mild fatty infiltration  cannot be excluded. Electronically Signed   By: [place]   On: 03/19/2020 11:27        Scheduled Meds: . apixaban  10 mg Oral BID   Followed by  . [START ON 03/26/2020] apixaban  5 mg Oral BID  . buPROPion  150 mg Oral Daily  . Chlorhexidine Gluconate Cloth  6 each Topical Daily  . metoprolol succinate  25 mg Oral Daily  . midodrine  5 mg Oral TID WC  . pantoprazole (PROTONIX) IV  40 mg Intravenous QHS   Continuous Infusions: . amiodarone    . heparin 1,100 Units/hr (03/19/20 0913)    Assessment & Plan:   Active Problems:   Pulmonary emboli (HCC)  Near Syncope in the setting of Submassive Pulmonary emboli Atrial Fibrillation w/ RVR>>resolved Acute hypoxic respiratory distress Hypotension Hx: HTN -s/p thrombectomy by vascular on 3/1 Echo with RV systolic function mildly reduced, moderate elevated pulmonary artery systolic pressure 3/3 -patient was started on heparin drip, will transition to Eliquis, spoke to pharmacy about this  transition  Hematology saw patient input was appreciated.  Hypercoagulable work-up is negative to date.  Factor V Leyden and prothrombin gene mutation are pending  Follow-up hematology in 2 weeks as outpatient  Doppler ultrasound negative for bilateral DVT Continue midodrine for hypotension   AKI-baseline cr 0.90. on admission cr 1.34 Improved.  Creatinine 0.85 today Avoid hypotension Avoid nephrotoxic meds    Transaminitis in the setting of nonspecific inflammation at the [place] gallbladder Now still elevated with mild decrease Right upper quadrant ultrasound with 4.6 mm gallstone but no gallbladder thickening and negative Murphy sign Mild increased echogenicity of the liver.  Mild fatty infiltration cannot be excluded Avoid hepatotoxic agents Will consult GI in a.m. Monitor levels    Hyperlipidemia -On statin .on hold due to transaminitis.  Cannot resume if LFTs do not improve    Thyroid Mass Bulky thyroid  goiter with retrosternal extension and moderate compression of the trachea at the thoracic inlet. - Thyroid panel normal (TSH 0.55, T4 8.6) -ENT following, appreciate input by Dr. Charolett Bumpers ~ will need an ultrasound-guided FNA when she is more stable, and will need removal of mass due to deviation of the trachea and concern for potential causing airway problems in the future.  Anxiety -Xanax as needed     DVT prophylaxis: Heparin drip...>to eliquis Code Status: Full Family Communication: None at bedside  Status is: Inpatient  Remains inpatient appropriate because:IV treatments appropriate due to intensity of illness or inability to take PO   Dispo: The patient is from: Home              Anticipated d/c is to: Home              Patient currently is not medically stable to d/c.   Difficult to place patient No               Possibly dc in 1-2 days, w/u pending            LOS: 3 days   Time spent: 35 minutes with more than 50% on [place], MD [facility] Pager [phone]  If 7PM-7AM, please contact night-coverage 03/19/2020, 4:43 PM

## 2020-03-19 NOTE — Evaluation (Signed)
Physical Therapy Evaluation Patient Details Name: Michele Garza MRN: [id number] DOB: December 24, 1950 Today's Date: 03/19/2020   History of Present Illness  Pt is 70 yo female after near syncopal event with CT angio of chest was positive for pulmonary emboli with clot in both main pulmonary arteries & lobar arteries, s/p thrombectomy 03/17/2020. Work up also found left-sided thyroid nodule/mass correlating with the prior CT.  Recommendations were for a FNA for histopathology per chart. PMH of anxiety, depression, GERD, HTN.    Clinical Impression  Pt easily woken at start of session, endorsed chronic LE pain. Time spent with patient reviewing PLOF and extensive LE issues;chronic bilateral knee pain reported with R knee meniscal tears and recent cortisone injections, as well partial L achilles tendon rupture. Had been in a boot up until recently, does not have the boot with her this admission. Lives in a condo alone with neighbors that check on her, son in [place], she just retired in February.     The patient demonstrated supine to sit with supervision, and good sitting balance. Sit <> stand with RW and CGA, fair balance noted. She ambulated ~61ft with RW and CGA. Initially endorsed LE stiffness (chronic) that improved with ambulation as well as bilateral leg heaviness. Decreased step height/length bilaterally, and step to -step through gait pattern noted. 2-3 standing rest breaks initiated by patient.  Overall the patient demonstrated deficits (see "PT Problem List") that impede the patient's functional abilities, safety, and mobility and would benefit from skilled PT intervention. Recommendation is HHPT with intermittent supervision.    SpO2 and HR monitored throughout; pt on room air, but with mobility potentially exhibited desaturation to mid 80s (unclear readings). Placed on 2L via [place] with RN consent. Able to return to room air at end of session spO2 >92%. HR in 110s during mobility.      Follow Up  Recommendations Home health PT;Supervision - Intermittent    Equipment Recommendations  Rolling walker with 5" wheels    Recommendations for Other Services OT consult     Precautions / Restrictions Precautions Precautions: Fall Restrictions Weight Bearing Restrictions: No      Mobility  Bed Mobility Overal bed mobility: Needs Assistance Bed Mobility: Supine to Sit;Sit to Supine     Supine to sit: HOB elevated;Supervision Sit to supine: HOB elevated;Min assist   General bed mobility comments: minA to return to bed for LE assist    Transfers Overall transfer level: Needs assistance Equipment used: Rolling walker (2 wheeled) Transfers: Sit to/from Stand Sit to Stand: Min guard;From elevated surface            Ambulation/Gait Ambulation/Gait assistance: Min guard Gait Distance (Feet): 80 Feet Assistive device: Rolling walker (2 wheeled)       General Gait Details: pt endorsed bilateral knee stiffness (baseline), step to gait progress to step through with time.  Stairs            Wheelchair Mobility    Modified Rankin (Stroke Patients Only)       Balance Overall balance assessment: Needs assistance Sitting-balance support: Feet supported Sitting balance-Leahy Scale: Good       Standing balance-Leahy Scale: Fair                               Pertinent Vitals/Pain Pain Assessment: Faces Faces Pain Scale: Hurts a little bit Pain Location: r knee > L knee Pain Descriptors / Indicators: Aching;Grimacing Pain Intervention(s): Limited activity within  patient's tolerance;Monitored during session;Repositioned    Home Living Family/patient expects to be discharged to:: Private residence Living Arrangements: Alone Available Help at Discharge: Available PRN/intermittently;Family;Neighbor Type of Home: House (condo) Home Access: Elevator     Home Layout: One level Home Equipment: [place] in (3 wheeled walker) Additional  Comments: pt with extensive bilateral knee history and achilles tendon rupture    Prior Function Level of Independence: Independent               Hand Dominance        Extremity/Trunk Assessment   Upper Extremity Assessment Upper Extremity Assessment: Overall WFL for tasks assessed    Lower Extremity Assessment Lower Extremity Assessment: Generalized weakness    Cervical / Trunk Assessment Cervical / Trunk Assessment: Normal  Communication   Communication: No difficulties  Cognition Arousal/Alertness: Awake/alert Behavior During Therapy: WFL for tasks assessed/performed Overall Cognitive Status: Within Functional Limits for tasks assessed                                        General Comments      Exercises     Assessment/Plan    PT Assessment Patient needs continued PT services  PT Problem List Decreased strength;Decreased mobility;Decreased balance;Decreased activity tolerance;Pain;Decreased knowledge of use of DME       PT Treatment Interventions DME instruction;Therapeutic exercise;Gait training;Balance training;Neuromuscular re-education;Functional mobility training;Therapeutic activities;Patient/family education    PT Goals (Current goals can be found in the Care Plan section)  Acute Rehab PT Goals Patient Stated Goal: to go home, get stronger in her legs PT Goal Formulation: With patient Time For Goal Achievement: 04/02/20 Potential to Achieve Goals: Good    Frequency Min 2X/week   Barriers to discharge        Co-evaluation               AM-PAC PT "6 Clicks" Mobility  Outcome Measure Help needed turning from your back to your side while in a flat bed without using bedrails?: A Little Help needed moving from lying on your back to sitting on the side of a flat bed without using bedrails?: A Little Help needed moving to and from a bed to a chair (including a wheelchair)?: A Little Help needed standing up from a chair  using your arms (e.g., wheelchair or bedside chair)?: A Little Help needed to walk in hospital room?: A Little Help needed climbing 3-5 steps with a railing? : A Lot 6 Click Score: 17    End of Session Equipment Utilized During Treatment: Gait belt Activity Tolerance: Patient tolerated treatment well;Patient limited by fatigue Patient left: with call bell/phone within reach;in bed Nurse Communication: Mobility status PT Visit Diagnosis: Other abnormalities of gait and mobility (R26.89);Muscle weakness (generalized) (M62.81);Pain Pain - Right/Left: Right (and left) Pain - part of body: Knee;Leg    Time: 1422-1500 PT Time Calculation (min) (ACUTE ONLY): 38 min   Charges:   PT Evaluation $PT Eval Low Complexity: 1 Low PT Treatments $Gait Training: 8-22 mins $Therapeutic Exercise: 8-22 mins        Lieutenant Diego PT, DPT 4:11 PM,03/19/20

## 2020-03-19 NOTE — Progress Notes (Signed)
ANTICOAGULATION CONSULT NOTE   Pharmacy Consult for Heparin Infusion Indication: pulmonary embolus  Allergies  Allergen Reactions   Clindamycin/Lincomycin Rash   Patient Measurements: Height: 5\' 3"  (160 cm) Weight: 115 kg (253 lb 8.5 oz) IBW/kg (Calculated) : 52.4 Heparin Dosing Weight: 78.9kg  Vital Signs: BP: 146/71 (03/03 0500) Pulse Rate: 93 (03/03 0500)  Labs: Recent Labs    03/16/20 1105 03/16/20 2001 03/17/20 0323 03/17/20 1216 03/18/20 0501 03/18/20 1230 03/18/20 2131 03/19/20 0436  HGB  --    < > 13.0  --  11.9*  --   --  10.6*  HCT  --   --  37.8  --  34.7*  --   --  31.7*  PLT  --   --  178  --  168  --   --  160  HEPARINUNFRC 1.13*   < > 0.93*   < > 0.35 0.25* 0.37 0.30  CREATININE  --   --  1.17*  --  0.99  --   --  0.85  TROPONINIHS 376*  --   --   --   --   --   --   --    < > = values in this interval not displayed.    Estimated Creatinine Clearance: 77.4 mL/min (by C-G formula based on SCr of 0.85 mg/dL).   Medical History: Past Medical History:  Diagnosis Date   Anxiety    Depression    Diverticulosis of colon    one prior episode July 2011 caused by tomato overingestion   GERD (gastroesophageal reflux disease)    History of colonoscopy    done in  dec 2011, repeat due 5 yrs due to [place]   Hypertension     Assessment: Pt is 70 yo female after near syncopal event with CT angio of chest was positive for pulmonary emboli with clot in both main pulmonary arteries & lobar arteries. Heparin Dosing Weight: 78.9kg   Patient is s/p thrombectomy. Consult to restart heparin following procedure.  Goal of Therapy:  Heparin level 0.3-0.7 units/ml Monitor platelets by anticoagulation protocol: Yes   Date Time HL Rate/comment 2/28 1105 1.13 1350 units/hr 2/28 2001 0.89 1100 units/hr 3/01     0323    0.93 950 units/hr  3/01 1216 0.93 Heparin running during lab draw 3/01 2109 0.26 800 units/hr 3/02 0501 0.35 900 units/hr, therapeutic x  1 3/02 1230 0.25 900 units/hr 3/02 2131 0.37 1000 units/hr 3/03 0436 0.30 1000 units/hr, therapeutic x 2  Plan:  Heparin level therapeutic x 2 but on low end of goal range. Will increase Heparin infusion to 1100 units/hr to discourage further drop.  Recheck HL and CBC daily while on heparin drip.  Ena Dawley, PharmD Clinical Pharmacist 03/19/2020 5:32 AM

## 2020-03-20 ENCOUNTER — Telehealth: Payer: Self-pay | Admitting: Internal Medicine

## 2020-03-20 DIAGNOSIS — R55 Syncope and collapse: Secondary | ICD-10-CM

## 2020-03-20 LAB — COMPREHENSIVE METABOLIC PANEL
ALT: 224 U/L — ABNORMAL HIGH (ref 0–44)
AST: 77 U/L — ABNORMAL HIGH (ref 15–41)
Albumin: 3.1 g/dL — ABNORMAL LOW (ref 3.5–5.0)
Alkaline Phosphatase: 49 U/L (ref 38–126)
Anion gap: 8 (ref 5–15)
BUN: 21 mg/dL (ref 8–23)
CO2: 29 mmol/L (ref 22–32)
Calcium: 8.6 mg/dL — ABNORMAL LOW (ref 8.9–10.3)
Chloride: 103 mmol/L (ref 98–111)
Creatinine, Ser: 0.84 mg/dL (ref 0.44–1.00)
GFR, Estimated: >60 mL/min (ref >60)
Glucose, Bld: 104 mg/dL — ABNORMAL HIGH (ref 70–99)
Potassium: 3.7 mmol/L (ref 3.5–5.1)
Sodium: 140 mmol/L (ref 135–145)
Total Bilirubin: 0.7 mg/dL (ref 0.3–1.2)
Total Protein: 5.7 g/dL — ABNORMAL LOW (ref 6.5–8.1)

## 2020-03-20 LAB — MAGNESIUM: Magnesium: 1.8 mg/dL (ref 1.7–2.4)

## 2020-03-20 LAB — CBC
HCT: 30.8 % — ABNORMAL LOW (ref 36.0–46.0)
Hemoglobin: 10 g/dL — ABNORMAL LOW (ref 12.0–15.0)
MCH: 31.3 pg (ref 26.0–34.0)
MCHC: 32.5 g/dL (ref 30.0–36.0)
MCV: 96.3 fL (ref 80.0–100.0)
Platelets: 166 10*3/uL (ref 150–400)
RBC: 3.2 MIL/uL — ABNORMAL LOW (ref 3.87–5.11)
RDW: 13 % (ref 11.5–15.5)
WBC: 10.9 10*3/uL — ABNORMAL HIGH (ref 4.0–10.5)
nRBC: 0 % (ref 0.0–0.2)

## 2020-03-20 LAB — FACTOR 5 LEIDEN

## 2020-03-20 LAB — PHOSPHORUS: Phosphorus: 3.8 mg/dL (ref 2.5–4.6)

## 2020-03-20 MED ORDER — APIXABAN (ELIQUIS) VTE STARTER PACK (10MG AND 5MG): ORAL_TABLET | ORAL | 0 refills | Status: DC

## 2020-03-20 MED ORDER — MIDODRINE HCL 5 MG PO TABS: 5.0000 mg | ORAL_TABLET | Freq: Three times a day (TID) | ORAL | 0 refills | Status: AC

## 2020-03-20 MED ORDER — LIDOCAINE 5 % EX PTCH
1.0000 | MEDICATED_PATCH | CUTANEOUS | Status: DC
  Administered 2020-03-20: 1 via TRANSDERMAL
  Filled 2020-03-20: qty 1

## 2020-03-20 NOTE — Telephone Encounter (Signed)
Noted  

## 2020-03-20 NOTE — Progress Notes (Signed)
Physical Therapy Treatment Patient Details Name: Duha Abair MRN: [id number] DOB: [birth year] Today's Date: 03/20/2020    History of Present Illness Pt is 70 yo female after near syncopal event with CT angio of chest was positive for pulmonary emboli with clot in both main pulmonary arteries & lobar arteries, s/p thrombectomy 03/17/2020. Work up also found left-sided thyroid nodule/mass correlating with the prior CT.  Recommendations were for a FNA for histopathology per chart. PMH of anxiety, depression, GERD, HTN.    PT Comments    Patient alert, agreeable to PT, actually asking to go to commode prior to getting up for lunch in chair. Pt on room air at start of session. Pts main complaint is sciatic R sided low back pain, 6-7/10 pain. Pt transferred to EOB with supervision. Sit <> stand with CGA and RW, cued for hand placement to increase safety. Pt able to ambulate ~10ft in room with RW and CGA. Improved gait quality compared to previous session but still exhibited deficits from PLOF. Pt did desaturate to 86% on room air, recovered to 90% at rest with time. Pt up in chair with all needs in reach. The patient would benefit from further skilled PT intervention to continue to progress towards goals. Recommendation remains appropriate.        Follow Up Recommendations  Home health PT;Supervision - Intermittent     Equipment Recommendations  Rolling walker with 5" wheels    Recommendations for Other Services OT consult     Precautions / Restrictions Precautions Precautions: Fall Restrictions Weight Bearing Restrictions: No    Mobility  Bed Mobility Overal bed mobility: Needs Assistance Bed Mobility: Supine to Sit;Sit to Supine     Supine to sit: HOB elevated;Supervision          Transfers Overall transfer level: Needs assistance Equipment used: Rolling walker (2 wheeled) Transfers: Sit to/from Stand Sit to Stand: Min guard         General transfer comment: cued for  hand placement, use of commode grab bar  Ambulation/Gait Ambulation/Gait assistance: Min guard Gait Distance (Feet): 40 Feet (71ft to  commode and back to recliner) Assistive device: Rolling walker (2 wheeled)       General Gait Details: improved gait quality from previous session, but still requiring RW support, fatigued   Stairs             Wheelchair Mobility    Modified Rankin (Stroke Patients Only)       Balance Overall balance assessment: Needs assistance Sitting-balance support: Feet supported Sitting balance-Leahy Scale: Good       Standing balance-Leahy Scale: Fair                              Cognition Arousal/Alertness: Awake/alert Behavior During Therapy: WFL for tasks assessed/performed Overall Cognitive Status: Within Functional Limits for tasks assessed                                        Exercises Other Exercises Other Exercises: Pt on room air, exhibited 86% desaturation with mobility, returned to 90% with seated rest break and time    General Comments        Pertinent Vitals/Pain Pain Score: 7  Pain Location: R low back pain Pain Descriptors / Indicators: Aching;Grimacing;Guarding;Moaning Pain Intervention(s): Limited activity within patient's tolerance;Monitored during session;Repositioned    Home Living  Prior Function            PT Goals (current goals can now be found in the care plan section) Acute Rehab PT Goals Patient Stated Goal: to go home, get stronger in her legs PT Goal Formulation: With patient Time For Goal Achievement: 04/02/20 Potential to Achieve Goals: Good Progress towards PT goals: Progressing toward goals    Frequency    Min 2X/week      PT Plan Current plan remains appropriate    Co-evaluation              AM-PAC PT "6 Clicks" Mobility   Outcome Measure  Help needed turning from your back to your side while in a flat bed  without using bedrails?: A Little Help needed moving from lying on your back to sitting on the side of a flat bed without using bedrails?: A Little Help needed moving to and from a bed to a chair (including a wheelchair)?: A Little Help needed standing up from a chair using your arms (e.g., wheelchair or bedside chair)?: A Little Help needed to walk in hospital room?: A Little Help needed climbing 3-5 steps with a railing? : A Little 6 Click Score: 18    End of Session Equipment Utilized During Treatment: Gait belt Activity Tolerance: Patient tolerated treatment well;Patient limited by fatigue Patient left: with call bell/phone within reach;in chair;with chair alarm set Nurse Communication: Mobility status PT Visit Diagnosis: Other abnormalities of gait and mobility (R26.89);Muscle weakness (generalized) (M62.81);Pain Pain - Right/Left: Right (and left) Pain - part of body: Knee;Leg     Time: 7412-8786 PT Time Calculation (min) (ACUTE ONLY): 15 min  Charges:  $Therapeutic Exercise: 8-22 mins                     Lieutenant Diego PT, DPT 1:19 PM,03/20/20

## 2020-03-20 NOTE — Discharge Summary (Signed)
Jocee Kissick PXT:[id number] DOB: [birth year] DOA: 03/16/2020  PCP: Crecencio Mc, MD  Admit date: 03/16/2020 Discharge date: 03/20/2020  Admitted From: Home Disposition: Home  Recommendations for Outpatient Follow-up:  1. Follow up with PCP in 1 week 2. Please obtain BMP/CBC in one week 3. Follow-up in 1 week with Dr. Mike Gip Hematology 4. Follow-up with the ENT Dr. Kathyrn Sheriff in 2 weeks  Home Health: Yes   Discharge Condition:Stable CODE STATUS: Full Diet recommendation: Heart Healthy  Brief/Interim Summary: Per HPI: Please note patient initially was in the ICU unit under the care of critical care attendings. 70 year old female presenting to the ED via EMS from home after a near syncopal event.  She reports feeling "odd" similar to times when she has had anxiety in the past.  She reported not feeling quite right since Saturday, 03/14/2020, but thought it was anxiety/nerves as she has a lot going on at the moment.  However on Sunday night to 02/24/20 she was attempting to walk her dog and felt as though she was going to pass out on the elevator.  She laid down on the floor of the elevator, holding the door open until a neighbor passed by and called EMS.  EMS stated her SPO2 was 88% on room air and that she was hyperventilating.   ED course: Initial vitals-afebrile at 98, RR-18, tachycardia at 102, BP-111/81, SPO2 98% on 3 L nasal cannula. Upon arrival to the ED she was no longer tachypneic but was more hypoxic with an SPO2 of 80% on room air.  She denies chest pain, and only admits to shortness of breath with exertion. She reports recently rupturing her left Achilles tendon in between that and chronic kidney injury her mobility has been reduced.  She has no history of blood clots, is vaccinated and boosted against COVID-19.  She denies fever, chills, sore throat, nausea, vomiting, abdominal pain and dysuria.  CT angio of chest was positive for pulmonary emboli with clot in both main pulmonary  arteries & lobar arteries.  Evidence of right heart strain RV to LV ratio greater than 1.5.  The CT angio also showed a bulky thyroid goiter causing moderate compression of the trachea as well as nonspecific inflammation at the porta hepatis and of the gallbladder. While in the emergency department the patient's oxygen requirements increased to a non rebreather.  When she attempted to get up she felt lightheaded and became hypoxic.  Before transferring up to the ICU her blood pressure dropped with systolics in the 62'V, and a liter bolus of saline was administered.During her hospitalization patient also had atrial fibrillation with RVR and then had developed acute hypoxic respiratory failure with hypotension. PCCM was consulted for admission.  ENT and vascular surgery was consulted.Patient status post thrombolysis and thrombectomy in multiple pulmonary arteries on 03/17/2020.  Once patient was stable was placed on heparin drip patient was transferred to the medical service.   Pulmonary emboli (HCC) Near Syncope in the setting of Submassive Pulmonary emboliAtrial Fibrillation w/ RVR>>resolved Acute hypoxic respiratory distress Hypotension Hx: HTN -s/p status post thrombolysis and thrombectomy in multiple pulmonary arteries on 03/17/2020.  by vascular  Echo with RV systolic function mildly reduced, moderate elevated pulmonary artery systolic pressure patient was started on heparin drip, transitioned to Eliquis dosing per pharmacy for PE treatment Hematology saw patient input was appreciated.  Hypercoagulable work-up is negative to date.  Factor V Leyden and prothrombin gene mutation are pending  Follow-up hematology in 2 weeks as outpatient  Doppler ultrasound  negative for bilateral DVT Continue midodrine for hypotension Patient ambulated today on room air with O2 sat dropped to 86%. Home 02 for 2L ordered   AKI-baseline cr 0.90. on admission cr 1.34 Improved.  Creatinine 0.84 today Avoid  hypotension Avoid nephrotoxic meds    Transaminitis in the setting of nonspecific inflammation at the [facility] & gallbladder LTF was worsening.  Could be hypotension also causing the increase in LFTs Now improving.  Right upper quadrant ultrasound with 4.6 mm gallstone but no gallbladder thickening and negative Murphy sign Mild increased echogenicity of the liver.  Mild fatty infiltration cannot be excluded Avoid hepatotoxic agents DC Lipitor outpatient med upon discharge Need to f/u with with pcp for repeat levels in few days to see if continuing to improve. Can consider GI consult as outpatient if not improving. AST 197>>>77 ALT 337>>>>224     Hyperlipidemia -On statin .on hold due to transaminitis. Will dc as outpatient until LFT improve  . F/u with pcp for further mx   Thyroid Mass Bulky thyroid goiter with retrosternal extension and moderate compression of the trachea at the thoracic inlet. -Thyroid panel normal (TSH 0.55, T4 8.6) -ENT following, appreciate input by Dr. Charolett Bumpers will need anultrasound-guided FNAwhen she is more stable, and will need removal of mass due todeviation of the trachea and concern for potential causing airway problems in the future.    Discharge Diagnoses:  Active Problems:   Pulmonary emboli [facility])    Discharge Instructions  Discharge Instructions    Call MD for:  difficulty breathing, headache or visual disturbances   Complete by: As directed    Diet - low sodium heart healthy   Complete by: As directed    Discharge instructions   Complete by: As directed    Wear 02 with ambulation F/u with pcp in one week Do not take aspirin, ibuprofen, celebrex, naproxen since on blood thinner , risk of gi bleed   Increase activity slowly   Complete by: As directed      Allergies as of 03/20/2020      Reactions   Clindamycin/lincomycin Rash      Medication List    STOP taking these medications   acyclovir 400 MG  tablet Commonly known as: ZOVIRAX   aspirin 81 MG tablet   atorvastatin 20 MG tablet Commonly known as: LIPITOR   azithromycin 250 MG tablet Commonly known as: ZITHROMAX   benzonatate 200 MG capsule Commonly known as: TESSALON   diclofenac Sodium 1 % Gel Commonly known as: VOLTAREN   losartan 50 MG tablet Commonly known as: COZAAR   meloxicam 15 MG tablet Commonly known as: MOBIC   QC Tumeric Complex 500 MG Caps Generic drug: Turmeric     TAKE these medications   Apixaban Starter Pack (10mg  and 5mg ) Commonly known as: ELIQUIS STARTER PACK Take as directed on package: start with two-5mg  tablets twice daily for 7 days (until 3/10 AM dose) and then on day 8 (3/10 evening dose) switch to one-5mg  tablet twice daily.   buPROPion 150 MG 24 hr tablet Commonly known as: WELLBUTRIN XL TAKE 1 TABLET BY MOUTH EVERY DAY   esomeprazole 20 MG capsule Commonly known as: NEXIUM Take 20 mg by mouth daily as needed.   fish oil-omega-3 fatty acids 1000 MG capsule Take 2 g by mouth every evening.   ipratropium 0.06 % nasal spray Commonly known as: ATROVENT Place 2 sprays into both nostrils 3 (three) times daily.   metoprolol succinate 25 MG 24 hr  tablet Commonly known as: TOPROL-XL TAKE 1 TABLET BY MOUTH EVERY DAY   midodrine 5 MG tablet Commonly known as: PROAMATINE Take 1 tablet (5 mg total) by mouth 3 (three) times daily with meals for 14 days.   multivitamin tablet Take 1 tablet by mouth every evening.   Premarin vaginal cream Generic drug: conjugated estrogens USE 1 TO 2 GRAMS INTRAVAGINALLY TWICE WEEKLY   sodium chloride 0.65 % Soln nasal spray Commonly known as: OCEAN Place 1 spray into both nostrils as needed for congestion.   VITAMIN D3 PO Take by mouth every evening.            Durable Medical Equipment  (From admission, onward)         Start     Ordered   03/20/20 1225  For home use only DME Walker rolling  Once       Question Answer Comment   Walker: With [place] Wheels   Patient needs a walker to treat with the following condition Weakness      03/20/20 1224   03/20/20 1223  For home use only DME oxygen  Once       Question Answer Comment  Length of Need 12 Months   Mode or (Route) Nasal cannula   Liters per Minute 2   Frequency Continuous (stationary and portable oxygen unit needed)   Oxygen conserving device Yes   Oxygen delivery system Gas      03/20/20 1222          Follow-up Information    Crecencio Mc, MD Follow up in 1 week(s).   Specialty: Internal Medicine Contact information: [place] [address] [phone]        Lequita Asal, MD Follow up in 2 day(s).   Specialty: Hematology and Oncology Contact information: [place] [address] [phone]        Margaretha Sheffield, MD Follow up in 2 week(s).   Specialty: Otolaryngology Contact information: [address] [place] [address] [phone]              Allergies  Allergen Reactions  . Clindamycin/Lincomycin Rash    Consultations:  Vascular, PCP, hematology   Procedures/Studies: CT Angio Chest PE W/Cm &/Or Wo Cm  Result Date: 03/16/2020 CLINICAL DATA:  70 year old female with syncopal episode while using the bathroom. EXAM: CT ANGIOGRAPHY CHEST WITH CONTRAST TECHNIQUE: Multidetector CT imaging of the chest was performed using the standard protocol during bolus administration of intravenous contrast. Multiplanar CT image reconstructions and MIPs were obtained to evaluate the vascular anatomy. CONTRAST:  75mL OMNIPAQUE IOHEXOL 350 MG/ML SOLN COMPARISON:  None. FINDINGS: Cardiovascular: Excellent contrast bolus timing in the pulmonary arterial tree. Positive bilateral distal main pulmonary artery and hilar pulmonary embolus (series 5, images 95, 109. Clot extends into all lobar branches. There is no saddle embolus. Associated abnormal RV/LV ratio = greater than  1.5. No pericardial effusion. Little contrast in the aorta. Calcified coronary artery atherosclerosis. Mediastinum/Nodes: Thyroid goiter with retrosternal extension of partially calcified bulky left lobe. No mediastinal lymphadenopathy. Lungs/Pleura: Trace pleural fluid. Dependent ground-glass opacity in both lungs but no confluent pulmonary infarct or consolidation. Moderate narrowing of the trachea at the thoracic inlet related to bulky left thyroid goiter (series 6, image 12). Otherwise the major airways are patent. Upper Abdomen: Suggestion of nonspecific inflammation at the porta hepatis in the visible upper abdomen. Small volume perihepatic ascites with simple fluid density. The spleen is not  enlarged. Gallbladder might be indistinct, uncertain. Visible pancreas, stomach and kidneys remain within normal limits. There is large bowel diverticulosis at the splenic flexure. Musculoskeletal: Mild probably chronic T4, T5 and T7 mild superior endplate compression. Degenerative appearing Schmorl's nodes occasionally in the lower thoracic spine. No suspicious osseous lesion. Review of the MIP images confirms the above findings. IMPRESSION: 1. Positive for acute pulmonary embolus with clot in both main PAs and lobar arteries. CT evidence of right heart strain (abnormal RV/LV Ratio suspicious for at least submassive (intermediate risk) PE. The presence of right heart strain has been associated with an increased risk of morbidity and mortality. 2. Trace pleural fluid and mild ground-glass opacity. No discrete pulmonary infarct at this time. 3. Evidence also of nonspecific inflammation at the [facility] with small volume Perihepatic Ascites. Questionable inflammation of the gallbladder. Follow-up CT Abdomen and Pelvis (IV contrast preferred) recommended when feasible. 4. Bulky thyroid goiter with retrosternal extension and moderate compression of the trachea at the thoracic inlet. 5. Calcified coronary artery  atherosclerosis. Aortic Atherosclerosis (ICD10-I70.0). Critical Value/emergent results were called by telephone at the time of interpretation on 03/16/2020 at 4:41 am to Dr. Hinda Kehr , who verbally acknowledged these results. Electronically Signed   By: Genevie Ann M.D.   On: 03/16/2020 04:45   PERIPHERAL VASCULAR CATHETERIZATION  Result Date: 03/17/2020 See op note  US Venous Img Lower Bilateral (DVT)  Result Date: 03/16/2020 CLINICAL DATA:  History of pulmonary embolism.  Evaluate for DVT. EXAM: BILATERAL LOWER EXTREMITY VENOUS DOPPLER ULTRASOUND TECHNIQUE: Gray-scale sonography with graded compression, as well as color Doppler and duplex ultrasound were performed to evaluate the lower extremity deep venous systems from the level of the common femoral vein and including the common femoral, femoral, profunda femoral, popliteal and calf veins including the posterior tibial, peroneal and gastrocnemius veins when visible. The superficial great saphenous vein was also interrogated. Spectral Doppler was utilized to evaluate flow at rest and with distal augmentation maneuvers in the common femoral, femoral and popliteal veins. COMPARISON:  Chest CTA-earlier same day FINDINGS: RIGHT LOWER EXTREMITY Common Femoral Vein: No evidence of thrombus. Normal compressibility, respiratory phasicity and response to augmentation. Saphenofemoral Junction: No evidence of thrombus. Normal compressibility and flow on color Doppler imaging. Profunda Femoral Vein: No evidence of thrombus. Normal compressibility and flow on color Doppler imaging. Femoral Vein: No evidence of thrombus. Normal compressibility, respiratory phasicity and response to augmentation. Popliteal Vein: No evidence of thrombus. Normal compressibility, respiratory phasicity and response to augmentation. Calf Veins: No evidence of thrombus. Normal compressibility and flow on color Doppler imaging. Superficial Great Saphenous Vein: No evidence of thrombus. Normal  compressibility. Venous Reflux:  None. Other Findings:  None. LEFT LOWER EXTREMITY Common Femoral Vein: No evidence of thrombus. Normal compressibility, respiratory phasicity and response to augmentation. Saphenofemoral Junction: No evidence of thrombus. Normal compressibility and flow on color Doppler imaging. Profunda Femoral Vein: No evidence of thrombus. Normal compressibility and flow on color Doppler imaging. Femoral Vein: No evidence of thrombus. Normal compressibility, respiratory phasicity and response to augmentation. Popliteal Vein: No evidence of thrombus. Normal compressibility, respiratory phasicity and response to augmentation. Calf Veins: Appear patent where visualized. Superficial Great Saphenous Vein: No evidence of thrombus. Normal compressibility. Venous Reflux:  None. Other Findings:  None. IMPRESSION: No evidence of DVT within either lower extremity. Electronically Signed   By: Sandi Mariscal M.D.   On: 03/16/2020 09:37   ECHOCARDIOGRAM COMPLETE  Result Date: 03/16/2020    ECHOCARDIOGRAM REPORT   Patient  Name:   Cora Collum Date of Exam: 03/16/2020 Medical Rec #:  [id number]      Height:       63.0 in Accession #:    [id number]     Weight:       239.6 lb Date of Birth:  November 05, 1950      BSA:          2.088 m Patient Age:    70 years       BP:           92/66 mmHg Patient Gender: F              HR:           109 bpm. Exam Location:  [facility] Procedure: 2D Echo, Color Doppler, Cardiac Doppler and Intracardiac            Opacification Agent Indications:     I26.09 Pulmonary Embolus  History:         Patient has no prior history of Echocardiogram examinations.                  Risk Factors:Hypertension.  Sonographer:     Charmayne Sheer RDCS (AE) Referring Phys:  [id number] BRITTON L [place] Diagnosing Phys: Kathlyn Sacramento MD  Sonographer Comments: Technically difficult study due to poor echo windows. TDS due to pt tachycardia throughout study. IMPRESSIONS  1. Left ventricular ejection fraction, by  estimation, is 55 to 60%. The left ventricle has normal function. The left ventricle has no regional wall motion abnormalities. Left ventricular diastolic parameters are consistent with Grade I diastolic dysfunction (impaired relaxation).  2. Right ventricular systolic function is mildly reduced. The right ventricular size is moderately enlarged. There is moderately elevated pulmonary artery systolic pressure. The estimated right ventricular systolic pressure is 53.6 mmHg.  3. The mitral valve is normal in structure. No evidence of mitral valve regurgitation. No evidence of mitral stenosis.  4. The aortic valve is normal in structure. Aortic valve regurgitation is not visualized. No aortic stenosis is present.  5. The inferior vena cava is dilated in size with <50% respiratory variability, suggesting right atrial pressure of 15 mmHg. FINDINGS  Left Ventricle: Left ventricular ejection fraction, by estimation, is 55 to 60%. The left ventricle has normal function. The left ventricle has no regional wall motion abnormalities. Definity contrast agent was given IV to delineate the left ventricular  endocardial borders. The left ventricular internal cavity size was normal in size. There is no left ventricular hypertrophy. Left ventricular diastolic parameters are consistent with Grade I diastolic dysfunction (impaired relaxation). Right Ventricle: The right ventricular size is moderately enlarged. No increase in right ventricular wall thickness. Right ventricular systolic function is mildly reduced. There is moderately elevated pulmonary artery systolic pressure. The tricuspid regurgitant velocity is 3.00 m/s, and with an assumed right atrial pressure of 15 mmHg, the estimated right ventricular systolic pressure is 14.4 mmHg. Left Atrium: Left atrial size was normal in size. Right Atrium: Right atrial size was normal in size. Pericardium: There is no evidence of pericardial effusion. Mitral Valve: The mitral valve is  normal in structure. No evidence of mitral valve regurgitation. No evidence of mitral valve stenosis. MV peak gradient, 4.2 mmHg. The mean mitral valve gradient is 1.0 mmHg. Tricuspid Valve: The tricuspid valve is normal in structure. Tricuspid valve regurgitation is mild . No evidence of tricuspid stenosis. Aortic Valve: The aortic valve is normal in structure. Aortic valve regurgitation is not visualized. No aortic stenosis is  present. Aortic valve mean gradient measures 2.0 mmHg. Aortic valve peak gradient measures 4.6 mmHg. Aortic valve area, by VTI measures 2.25 cm. Pulmonic Valve: The pulmonic valve was normal in structure. Pulmonic valve regurgitation is not visualized. No evidence of pulmonic stenosis. Aorta: The aortic root is normal in size and structure. Venous: The inferior vena cava is dilated in size with less than 50% respiratory variability, suggesting right atrial pressure of 15 mmHg. IAS/Shunts: No atrial level shunt detected by color flow Doppler.  LEFT VENTRICLE PLAX 2D LVIDd:         3.20 cm  Diastology LVIDs:         2.10 cm  LV e' medial:    4.46 cm/s LV PW:         1.20 cm  LV E/e' medial:  9.7 LV IVS:        0.80 cm  LV e' lateral:   7.51 cm/s LVOT diam:     2.00 cm  LV E/e' lateral: 5.8 LV SV:         31 LV SV Index:   15 LVOT Area:     3.14 cm  LEFT ATRIUM         Index LA diam:    2.60 cm 1.25 cm/m  AORTIC VALVE                   PULMONIC VALVE AV Area (Vmax):    2.05 cm    PV Vmax:       0.58 m/s AV Area (Vmean):   2.06 cm    PV Vmean:      43.600 cm/s AV Area (VTI):     2.25 cm    PV VTI:        0.104 m AV Vmax:           107.00 cm/s PV Peak grad:  1.4 mmHg AV Vmean:          68.200 cm/s PV Mean grad:  1.0 mmHg AV VTI:            0.137 m AV Peak Grad:      4.6 mmHg AV Mean Grad:      2.0 mmHg LVOT Vmax:         69.80 cm/s LVOT Vmean:        44.800 cm/s LVOT VTI:          0.098 m LVOT/AV VTI ratio: 0.72  AORTA Ao Root diam: 3.60 cm MITRAL VALVE               TRICUSPID VALVE MV Area  (PHT): 6.07 cm    TR Peak grad:   36.0 mmHg MV Area VTI:   2.61 cm    TR Vmax:        300.00 cm/s MV Peak grad:  4.2 mmHg MV Mean grad:  1.0 mmHg    SHUNTS MV Vmax:       1.03 m/s    Systemic VTI:  0.10 m MV Vmean:      53.3 cm/s   Systemic Diam: 2.00 cm MV Decel Time: 125 msec MV E velocity: 43.30 cm/s MV A velocity: 76.30 cm/s MV E/A ratio:  0.57 Kathlyn Sacramento MD Electronically signed by Kathlyn Sacramento MD Signature Date/Time: 03/16/2020/2:02:53 PM    Final    US THYROID  Result Date: 03/16/2020 CLINICAL DATA:  Incidental on CT. Goiter incidentally noted on chest CT EXAM: THYROID ULTRASOUND TECHNIQUE: Ultrasound examination of the thyroid gland and adjacent  soft tissues was performed. COMPARISON:  Chest CT-earlier same day FINDINGS: Parenchymal Echotexture: Mildly heterogenous Isthmus: Borderline enlarged measures 0.8 cm in diameter Right lobe: Slightly atrophic measuring 4.3 x 1.4 x 1.5 cm Left lobe: Enlarged measuring 7.3 x 3.9 x 2.8 cm _________________________________________________________ Estimated total number of nodules >/= 1 cm: 1 Number of spongiform nodules >/=  2 cm not described below (TR1): 0 Number of mixed cystic and solid nodules >/= 1.5 cm not described below (TR2): 0 _________________________________________________________ Nodule # 1: Location: Left; Inferior - this nodule correlates with the nodule seen on preceding chest CT Maximum size: 4.1 cm; Other 2 dimensions: 3.4 x 3.1 cm (nodule estimated to measure at least 4.1 cm as there is significant substernal extension demonstrated on preceding chest CT) Composition: solid/almost completely solid (2) Echogenicity: isoechoic (1) Shape: taller-than-wide (3) Margins: ill-defined (0) Echogenic foci: none (0) ACR TI-RADS total points: 6. ACR TI-RADS risk category: TR4 (4-6 points). ACR TI-RADS recommendations: **Given size (>/= 1.5 cm) and appearance, fine needle aspiration of this moderately suspicious nodule should be considered based on  TI-RADS criteria. _________________________________________________________ IMPRESSION: Left-sided thyroid nodule/mass correlating with the nodule seen on preceding chest CT meets imaging criteria to recommend percutaneous sampling as indicated. The above is in keeping with the ACR TI-RADS recommendations - J Am Coll Radiol 2017;14:587-595. Electronically Signed   By: Sandi Mariscal M.D.   On: 03/16/2020 09:42   US Abdomen Limited RUQ (LIVER/GB)  Result Date: 03/19/2020 CLINICAL DATA:  Elevated LFTs. EXAM: ULTRASOUND ABDOMEN LIMITED RIGHT UPPER QUADRANT COMPARISON:  No prior. FINDINGS: Gallbladder: 4.6 mm gallstone noted. Gallbladder wall thickness normal. Negative Murphy sign. Common bile duct: Diameter: 2.5 mm Liver: Mild increased echogenicity of the liver. Mild fatty infiltration cannot be excluded. Portal vein is patent on color Doppler imaging with normal direction of blood flow towards the liver. Other: None. IMPRESSION: 1 4.6 mm gallstone. Gallbladder wall thickness normal. Negative Murphy sign. 2. Mild increased echogenicity of the liver. Mild fatty infiltration cannot be excluded. Electronically Signed   By: Marcello Moores  Register   On: 03/19/2020 11:27       Subjective: Feels better. No cp or sob  Discharge Exam: Vitals:   03/20/20 0815 03/20/20 1223  BP: 140/71 (!) 149/61  Pulse: 88 96  Resp: 18 18  Temp: 98.4 F (36.9 C) 97.8 F (36.6 C)  SpO2: 97% 96%   Vitals:   03/19/20 1922 03/20/20 0448 03/20/20 0815 03/20/20 1223  BP: (!) 155/76 (!) 145/78 140/71 (!) 149/61  Pulse: 95 81 88 96  Resp: 16 16 18 18   Temp: 98.2 F (36.8 C) 98.6 F (37 C) 98.4 F (36.9 C) 97.8 F (36.6 C)  TempSrc: Oral  Oral   SpO2: 97% 97% 97% 96%  Weight:  107.9 kg    Height:        General: Pt is alert, awake, not in acute distress Cardiovascular: RRR, S1/S2 +, no rubs, no gallops Respiratory: CTA bilaterally, no wheezing, no rhonchi Abdominal: Soft, NT, ND, bowel sounds + Extremities: no  edema    The results of significant diagnostics from this hospitalization (including imaging, microbiology, ancillary and laboratory) are listed below for reference.     Microbiology: Recent Results (from the past 240 hour(s))  Resp Panel by RT-PCR (Flu A&B, Covid) Nasopharyngeal Swab     Status: None   Collection Time: 03/16/20  4:47 AM   Specimen: Nasopharyngeal Swab; Nasopharyngeal(NP) swabs in vial transport medium  Result Value Ref Range Status   SARS Coronavirus  2 by RT PCR NEGATIVE NEGATIVE Final    Comment: (NOTE) SARS-CoV-2 target nucleic acids are NOT DETECTED.  The SARS-CoV-2 RNA is generally detectable in upper respiratory specimens during the acute phase of infection. The lowest concentration of SARS-CoV-2 viral copies this assay can detect is 138 copies/mL. A negative result does not preclude SARS-Cov-2 infection and should not be used as the sole basis for treatment or other patient management decisions. A negative result may occur with  improper specimen collection/handling, submission of specimen other than nasopharyngeal swab, presence of viral mutation(s) within the areas targeted by this assay, and inadequate number of viral copies(<138 copies/mL). A negative result must be combined with clinical observations, patient history, and epidemiological information. The expected result is Negative.  Fact Sheet for Patients:  EntrepreneurPulse.com.au  Fact Sheet for Healthcare Providers:  IncredibleEmployment.be  This test is no t yet approved or cleared by the [place] FDA and  has been authorized for detection and/or diagnosis of SARS-CoV-2 by FDA under an Emergency Use Authorization (EUA). This EUA will remain  in effect (meaning this test can be used) for the duration of the COVID-19 declaration under Section 564(b)(1) of the Act, 21 U.S.C.section 360bbb-3(b)(1), unless the authorization is terminated  or revoked  sooner.       Influenza A by PCR NEGATIVE NEGATIVE Final   Influenza B by PCR NEGATIVE NEGATIVE Final    Comment: (NOTE) The Xpert Xpress SARS-CoV-2/FLU/RSV plus assay is intended as an aid in the diagnosis of influenza from Nasopharyngeal swab specimens and should not be used as a sole basis for treatment. Nasal washings and aspirates are unacceptable for Xpert Xpress SARS-CoV-2/FLU/RSV testing.  Fact Sheet for Patients: EntrepreneurPulse.com.au  Fact Sheet for Healthcare Providers: IncredibleEmployment.be  This test is not yet approved or cleared by the [place] FDA and has been authorized for detection and/or diagnosis of SARS-CoV-2 by FDA under an Emergency Use Authorization (EUA). This EUA will remain in effect (meaning this test can be used) for the duration of the COVID-19 declaration under Section 564(b)(1) of the Act, 21 U.S.C. section 360bbb-3(b)(1), unless the authorization is terminated or revoked.  Performed at [facility], [address] [place], [place] [address]   MRSA PCR Screening     Status: None   Collection Time: 03/16/20  6:24 AM   Specimen: Nasopharyngeal  Result Value Ref Range Status   MRSA by PCR NEGATIVE NEGATIVE Final    Comment:        The GeneXpert MRSA Assay (FDA approved for NASAL specimens only), is one component of a comprehensive MRSA colonization surveillance program. It is not intended to diagnose MRSA infection nor to guide or monitor treatment for MRSA infections. Performed at [facility], [address] [place], [place] [address]      Labs: BNP (last 3 results) Recent Labs    03/16/20 0233  BNP 101.7*   Basic Metabolic Panel: Recent Labs  Lab 03/16/20 0233 03/17/20 0323 03/18/20 0501 03/19/20 0436 03/20/20 0444  NA 136 132* 137 140 140  K 4.3 4.1 3.9 3.6 3.7  CL 103 104 106 106 103  CO2 22 19* 26 26 29   GLUCOSE 226* 140* 110* 111* 104*  BUN 27*  31* 25* 19 21  CREATININE 1.34* 1.17* 0.99 0.85 0.84  CALCIUM 9.2 8.4* 8.5* 8.6* 8.6*  MG  --  1.6* 2.1 1.9 1.8  PHOS  --  3.2 2.7 2.7 3.8   Liver Function Tests: Recent Labs  Lab 03/16/20  0277 03/18/20 0501 03/19/20 0436 03/20/20 0444  AST 71* 197* 176* 77*  ALT 51* 330* 337* 224*  ALKPHOS 55 44 52 49  BILITOT 0.8 0.8 0.8 0.7  PROT 6.7 5.9* 5.9* 5.7*  ALBUMIN 3.8 3.3* 3.3* 3.1*   Recent Labs  Lab 03/16/20 0233  LIPASE 28   No results for input(s): AMMONIA in the last 168 hours. CBC: Recent Labs  Lab 03/16/20 0233 03/17/20 0323 03/18/20 0501 03/19/20 0436 03/20/20 0444  WBC 12.0* 14.9* 12.0* 11.7* 10.9*  NEUTROABS 9.7*  --   --   --   --   HGB 12.8 13.0 11.9* 10.6* 10.0*  HCT 39.0 37.8 34.7* 31.7* 30.8*  MCV 95.8 92.2 93.3 95.5 96.3  PLT 178 178 168 160 166   Cardiac Enzymes: No results for input(s): CKTOTAL, CKMB, CKMBINDEX, TROPONINI in the last 168 hours. BNP: Invalid input(s): POCBNP CBG: Recent Labs  Lab 03/16/20 0604 03/17/20 1922  GLUCAP 154* 124*   D-Dimer No results for input(s): DDIMER in the last 72 hours. Hgb A1c No results for input(s): HGBA1C in the last 72 hours. Lipid Profile No results for input(s): CHOL, HDL, LDLCALC, TRIG, CHOLHDL, LDLDIRECT in the last 72 hours. Thyroid function studies No results for input(s): TSH, T4TOTAL, T3FREE, THYROIDAB in the last 72 hours.  Invalid input(s): FREET3 Anemia work up No results for input(s): VITAMINB12, FOLATE, FERRITIN, TIBC, IRON, RETICCTPCT in the last 72 hours. Urinalysis    Component Value Date/Time   COLORURINE YELLOW 12/12/2017 1138   APPEARANCEUR CLEAR 12/12/2017 1138   LABSPEC <=1.005 (A) 12/12/2017 1138   PHURINE 5.5 12/12/2017 1138   GLUCOSEU NEGATIVE 12/12/2017 1138   HGBUR MODERATE (A) 12/12/2017 1138   BILIRUBINUR NEGATIVE 12/12/2017 1138   KETONESUR NEGATIVE 12/12/2017 1138   UROBILINOGEN 0.2 12/12/2017 1138   NITRITE NEGATIVE 12/12/2017 1138   LEUKOCYTESUR MODERATE  (A) 12/12/2017 1138   Sepsis Labs Invalid input(s): PROCALCITONIN,  WBC,  LACTICIDVEN Microbiology Recent Results (from the past 240 hour(s))  Resp Panel by RT-PCR (Flu A&B, Covid) Nasopharyngeal Swab     Status: None   Collection Time: 03/16/20  4:47 AM   Specimen: Nasopharyngeal Swab; Nasopharyngeal(NP) swabs in vial transport medium  Result Value Ref Range Status   SARS Coronavirus 2 by RT PCR NEGATIVE NEGATIVE Final    Comment: (NOTE) SARS-CoV-2 target nucleic acids are NOT DETECTED.  The SARS-CoV-2 RNA is generally detectable in upper respiratory specimens during the acute phase of infection. The lowest concentration of SARS-CoV-2 viral copies this assay can detect is 138 copies/mL. A negative result does not preclude SARS-Cov-2 infection and should not be used as the sole basis for treatment or other patient management decisions. A negative result may occur with  improper specimen collection/handling, submission of specimen other than nasopharyngeal swab, presence of viral mutation(s) within the areas targeted by this assay, and inadequate number of viral copies(<138 copies/mL). A negative result must be combined with clinical observations, patient history, and epidemiological information. The expected result is Negative.  Fact Sheet for Patients:  EntrepreneurPulse.com.au  Fact Sheet for Healthcare Providers:  IncredibleEmployment.be  This test is no t yet approved or cleared by the [place] FDA and  has been authorized for detection and/or diagnosis of SARS-CoV-2 by FDA under an Emergency Use Authorization (EUA). This EUA will remain  in effect (meaning this test can be used) for the duration of the COVID-19 declaration under Section 564(b)(1) of the Act, 21 U.S.C.section 360bbb-3(b)(1), unless the authorization is terminated  or revoked  sooner.       Influenza A by PCR NEGATIVE NEGATIVE Final   Influenza B by PCR NEGATIVE  NEGATIVE Final    Comment: (NOTE) The Xpert Xpress SARS-CoV-2/FLU/RSV plus assay is intended as an aid in the diagnosis of influenza from Nasopharyngeal swab specimens and should not be used as a sole basis for treatment. Nasal washings and aspirates are unacceptable for Xpert Xpress SARS-CoV-2/FLU/RSV testing.  Fact Sheet for Patients: EntrepreneurPulse.com.au  Fact Sheet for Healthcare Providers: IncredibleEmployment.be  This test is not yet approved or cleared by the [place] FDA and has been authorized for detection and/or diagnosis of SARS-CoV-2 by FDA under an Emergency Use Authorization (EUA). This EUA will remain in effect (meaning this test can be used) for the duration of the COVID-19 declaration under Section 564(b)(1) of the Act, 21 U.S.C. section 360bbb-3(b)(1), unless the authorization is terminated or revoked.  Performed at [facility], [address] [place], [place] [address]   MRSA PCR Screening     Status: None   Collection Time: 03/16/20  6:24 AM   Specimen: Nasopharyngeal  Result Value Ref Range Status   MRSA by PCR NEGATIVE NEGATIVE Final    Comment:        The GeneXpert MRSA Assay (FDA approved for NASAL specimens only), is one component of a comprehensive MRSA colonization surveillance program. It is not intended to diagnose MRSA infection nor to guide or monitor treatment for MRSA infections. Performed at [facility], [address] [place],  [address]      Time coordinating discharge: Over 30 minutes  SIGNED:   Nolberto Hanlon, MD  [facility] 03/20/2020, 1:13 PM Pager   If 7PM-7AM, please contact night-coverage [email or web address] Password [email or web address]

## 2020-03-20 NOTE — Telephone Encounter (Signed)
Patient scheduled for hospital follow up on 03/27/2020

## 2020-03-20 NOTE — TOC Transition Note (Addendum)
Transition of Care [facility]) - CM/SW Discharge Note   Patient Details  Name: Michele Garza MRN: [id number] Date of Birth: 03-May-1950  Transition of Care [facility] CM/SW Contact:  Kerin Salen, RN Phone Number: 03/20/2020, 11:00 AM   Clinical Narrative: Patient to go home today with Oxygen and [place] PT, [place] referral to [facility] pending acceptance, [place] will contact me to confirm. Oxygen and rolling walker per [employer].     Jackquline [place], [place]  returned text to confirm that they will provide HHPT service for patient.     Final next level of care: [place] Barriers to Discharge: Barriers Resolved   Patient Goals and CMS Choice Patient states their goals for this hospitalization and ongoing recovery are:: To return home   Choice offered to / list presented to : NA  Discharge Placement                       Discharge Plan and Services In-house Referral: Clinical Social Work Discharge Planning Services: NA Post Acute Care Choice: [place]          DME Arranged: [place]ford Rile rolling,Oxygen DME Agency: [employer] Date DME Agency Contacted: 03/20/20 Time DME Agency Contacted: [id number] Representative spoke with at DME Agency: [place]: PT [place]: [facility] [place] Date [place]: 03/20/20 Time [place]: 1058 Representative spoke with at [place]: [facility] ([place]) Interventions     Readmission Risk Interventions No flowsheet data found.

## 2020-03-20 NOTE — Progress Notes (Signed)
SATURATION QUALIFICATIONS: (This note is used to comply with regulatory documentation for home oxygen)  Patient Saturations on Room Air at Rest = 93%  Patient Saturations on Room Air while Ambulating = 86%  Patient Saturations on 2 Liters of oxygen while Ambulating = 95%  Please briefly explain why patient needs home oxygen: may benefit from home O2 due to lower saturations with ambulation

## 2020-03-20 NOTE — Evaluation (Signed)
Occupational Therapy Evaluation Patient Details Name: Michele Garza MRN: [id number] DOB: 14-Feb-1950 Today's Date: 03/20/2020    History of Present Illness Pt is 70 yo female after near syncopal event with CT angio of chest was positive for pulmonary emboli with clot in both main pulmonary arteries & lobar arteries, s/p thrombectomy 03/17/2020. Work up also found left-sided thyroid nodule/mass correlating with the prior CT.  Recommendations were for a FNA for histopathology per chart. PMH of anxiety, depression, GERD, HTN.   Clinical Impression   Pt was seen for OT evaluation this date. Prior to hospital admission, pt was independent. Pt lives in a 3rd floor with elevator access. Pt on room air at start of session, endorsing 7/10 sciatica, R>L hip/leg. Currently pt demonstrates impairments as described below (See OT problem list) which functionally limit her ability to perform ADL/self-care tasks. Pt currently requires CGA for ADL transfers with a RW and PRN Min A for LB ADL. Pt instructed in PLB, AE/DME to improve LB access for ADL, falls prevention, pet care considerations, and ECS. Pt verbalized understanding but would benefit from skilled OT services while hospitalized to address noted impairments and functional limitations (see below for any additional details) in order to maximize safety and independence while minimizing falls risk and caregiver burden.    Follow Up Recommendations  No OT follow up    Equipment Recommendations  3 in 1 bedside commode    Recommendations for Other Services       Precautions / Restrictions Precautions Precautions: Fall Restrictions Weight Bearing Restrictions: No      Mobility Bed Mobility Overal bed mobility: Needs Assistance Bed Mobility: Supine to Sit;Sit to Supine     Supine to sit: HOB elevated;Supervision Sit to supine: Supervision        Transfers Overall transfer level: Needs assistance Equipment used: Rolling walker (2  wheeled) Transfers: Sit to/from Stand Sit to Stand: Min guard         General transfer comment: cued for hand placement    Balance Overall balance assessment: Needs assistance Sitting-balance support: Feet supported Sitting balance-Leahy Scale: Good     Standing balance support: Single extremity supported;During functional activity Standing balance-Leahy Scale: Fair                             ADL either performed or assessed with clinical judgement   ADL Overall ADL's : Needs assistance/impaired                                       General ADL Comments: CGA for ADL transfers, supervision for LB ADL, increased pain with hip flexion     Vision Patient Visual Report: No change from baseline       Perception     Praxis      Pertinent Vitals/Pain Pain Assessment: 0-10 Pain Score: 7  Pain Location: R low back pain Pain Descriptors / Indicators: Aching;Grimacing;Guarding;Moaning Pain Intervention(s): Limited activity within patient's tolerance;Monitored during session;Repositioned;Heat applied;Patient requesting pain meds-RN notified     Hand Dominance     Extremity/Trunk Assessment Upper Extremity Assessment Upper Extremity Assessment: Overall WFL for tasks assessed   Lower Extremity Assessment Lower Extremity Assessment: Generalized weakness   Cervical / Trunk Assessment Cervical / Trunk Assessment: Normal   Communication Communication Communication: No difficulties   Cognition Arousal/Alertness: Awake/alert Behavior During Therapy: WFL for tasks assessed/performed Overall  Cognitive Status: Within Functional Limits for tasks assessed                                     General Comments       Exercises Other Exercises Other Exercises: Pt on room air, exhibited 86% desaturation with mobility, returned to 90% with seated rest break and time Other Exercises: Pt instructed in PLB, AE/DME to improve LB access for  ADL, falls prevention, pet care considerations, and ECS   Shoulder Instructions      Home Living Family/patient expects to be discharged to:: Private residence Living Arrangements: Alone Available Help at Discharge: Available PRN/intermittently;Family;Neighbor Type of Home: House (condo) Home Access: Elevator     Home Layout: One level     Bathroom Shower/Tub: Occupational psychologist: Standard     Home Equipment: Shower seat - built in (3 wheeled walker)   Additional Comments: pt with extensive bilateral knee history and achilles tendon rupture      Prior Functioning/Environment Level of Independence: Independent                 OT Problem List: Decreased strength;Pain;Decreased knowledge of use of DME or AE;Impaired balance (sitting and/or standing)      OT Treatment/Interventions: Self-care/ADL training;Therapeutic exercise;Therapeutic activities;Energy conservation;DME and/or AE instruction;Patient/family education;Balance training    OT Goals(Current goals can be found in the care plan section) Acute Rehab OT Goals Patient Stated Goal: to go home, get stronger in her legs OT Goal Formulation: With patient Time For Goal Achievement: 04/03/20 Potential to Achieve Goals: Good ADL Goals Pt Will Perform Lower Body Dressing: sit to/from stand;with modified independence Pt Will Transfer to Toilet: ambulating;with modified independence (LRAD for amb) Additional ADL Goal #1: Pt will verbalize plan to implement at least 1 learned ECS to support ADL and IADL participation while minimizing SOB/over exertion.  OT Frequency: Min 1X/week   Barriers to D/C:            Co-evaluation              AM-PAC OT "6 Clicks" Daily Activity     Outcome Measure Help from another person eating meals?: None Help from another person taking care of personal grooming?: None Help from another person toileting, which includes using toliet, bedpan, or urinal?: A  Little Help from another person bathing (including washing, rinsing, drying)?: A Little Help from another person to put on and taking off regular upper body clothing?: None Help from another person to put on and taking off regular lower body clothing?: A Little 6 Click Score: 21   End of Session Equipment Utilized During Treatment: Gait belt;Rolling walker Nurse Communication: Other (comment);Patient requests pain meds (SpO2)  Activity Tolerance: Patient tolerated treatment well Patient left: in bed;with call bell/phone within reach;with bed alarm set  OT Visit Diagnosis: Other abnormalities of gait and mobility (R26.89);Muscle weakness (generalized) (M62.81)                Time: 0174-9449 OT Time Calculation (min): 45 min Charges:  OT General Charges $OT Visit: 1 Visit OT Evaluation $OT Eval Moderate Complexity: 1 Mod OT Treatments $Self Care/Home Management : 23-37 mins  Hanley Hays, MPH, MS, OTR/L ascom [phone] 03/20/20, 1:37 PM

## 2020-03-23 ENCOUNTER — Encounter: Payer: Self-pay | Admitting: Hematology and Oncology

## 2020-03-23 NOTE — Progress Notes (Signed)
[facility]  [address], [address] [place], [place] [address] Phone: [phone]  Fax: [phone]   Clinic Day:  03/24/2020  Referring physician: Crecencio Mc, MD  Chief Complaint: Michele Garza is a 70 y.o. female with bilateral pulmonary emboli who was seen assessment after interval hospitalization.   HPI:   Approximately 2 weeks ago, she injured her left Achilles tendon and was in a boot.  Subsequently, she was not as active.  She developed shortness of breath and near syncope on 03/15/2020.  She presented to the emergency room via EMS.  She was admitted to [facility] from 03/16/2020 - 03/20/2020.  Chest CT angiogram on 03/16/2020 revealed acute pulmonary emboli in both main pulmonary arteries and lobar arteries.  There was evidence of right heart strain suspicious for a submassive pulmonary embolism.  There was nonspecific inflammation at the porta hepatis with a small volume of perihepatic ascites.  There was a bulky thyroid goiter with retrosternal extension and moderate compression of the trachea at the thoracic inlet.  Bilateral lower extremity duplex on 03/16/2020 revealed no evidence of a DVT.  Echo on 03/16/2020 revealed an ejection fraction of 55-60% with right ventricular function mildly reduced ventricular size moderately enlarged.    She underwent thrombolysis and mechanical thrombectomy by Dr. Hortencia Pilar on 03/17/2020.  Course was complicated by atrial fibrillation with RVR and developed acute hypoxic respiratory failure with hypotension.  She has been in the ICU on a heparin drip.  While in the ER she was on a nonrebreather.  She was discharged on oxygen 2 L/min via nasal cannula.    Initial CBC revealed a hematocrit of 39.0, hemoglobin 12.8, MCV 95.8, platelets 178,000, WBC 12,000 with an ANC of 9700.  Creatinine was 1.34.  LFTs included an AST of 71 and ALT of 51.  Hypercoagulable work-up included the following normal studies: Factor V  Leiden, prothrombin gene mutation, lupus anticoagulant panel, anti-cardiolipin antibodies, beta-2 glycoprotein antibodies, protein C activity and antigen, protein S activity and antigen and antithrombin III activity.   Labs on the day of discharge revealed a hematocrit of 30.8, hemoglobin 10.0, MCV 96.3, platelets 166,000, WBC 10,900. Total protein was 5.7. AST was 77, ALT 224, bilirubin 0.7, and alkaline phosphatase 49.  She was seen by Dr. Kathyrn Sheriff in ENT regarding her thyroid.  Thyroid ultrasound on 03/16/2020 revealed a left-sided thyroid nodule/mass correlating with the prior CT.  Recommendations were for a FNA for histopathology.  She will need to undergo thyroidectomy because of the deviation of the trachea and concern for airway problems in the future.  Surgery will be conducted when she can be off of blood thinners.  Thoracic surgery will need to be involved as it extends in to the mediastinum and down to the aortic arch.    Symptomatically, she has been "a lot better" since leaving the hospital. Her breathing has improved. She uses oxygen for a few minutes each day. She checks her oxygen saturation regularly. Her nausea has resolved. Her legs are a bit wobbly and stiff.  She thinks she may have a UTI. She reports frequency and urgency but denies burning with urination. She has been running a low grade fever.  She is taking Eliquis 10 mg BID and will be switching to 5 mg BID after 7 days.  She has an appointment with Dr. Derrel Nip on 03/27/2020. She has an appointment with Dr. Kathyrn Sheriff on 04/03/2020.  She notes a family history of breast and colon cancer.  Her mother  had breast cancer x2 (age 77 and 36).  Her father had colon cancer at age 63.  A paternal grandmother also had colon cancer at age 24.  She denies any family history of thrombosis.   Past Medical History:  Diagnosis Date  . Anxiety   . Depression   . Diverticulosis of colon    one prior episode July 2011 caused by tomato  overingestion  . GERD (gastroesophageal reflux disease)   . History of colonoscopy    done in  dec 2011, repeat due 5 yrs due to [place]  . Hypertension     Past Surgical History:  Procedure Laterality Date  . BREAST SURGERY     sterotactic breaswt biopsy: atypical cells,  continued diagnostic mammograms  . COLONOSCOPY    . COLONOSCOPY WITH PROPOFOL N/A 02/21/2020   Procedure: COLONOSCOPY WITH PROPOFOL;  Surgeon: Virgel Manifold, MD;  Location: [facility] ENDOSCOPY;  Service: Endoscopy;  Laterality: N/A;  . PULMONARY THROMBECTOMY Bilateral 03/17/2020   Procedure: PULMONARY THROMBECTOMY / THROMBOLYSIS;  Surgeon: Katha Cabal, MD;  Location: [facility];  Service: Cardiovascular;  Laterality: Bilateral;    Family History  Problem Relation Age of Onset  . Cancer Mother        bilatera, contralateral recurred after 30 yrs  . Stroke Father   . Heart disease Father 64       died of stroke  at 43, tobacco user  . Cancer Father        colon Ca  . Cancer Paternal Grandmother 23       metastatic colon Ca  . Cancer Maternal Uncle        breast x 2 aunts    Social History:  reports that she has never smoked. She has never used smokeless tobacco. She reports current alcohol use of about 1.0 standard drink of alcohol per week. She reports that she does not use drugs.  The patient denies any exposure to radiation or toxins.  She retired in 01/2020.  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  The patient lives in [place].  She is alone today.  Allergies:  Allergies  Allergen Reactions  . Clindamycin/Lincomycin Rash    Current Medications: Current Outpatient Medications  Medication Sig Dispense Refill  . APIXABAN (ELIQUIS) VTE STARTER PACK (10MG  AND 5MG ) Take as directed on package: start with two-5mg  tablets twice daily for 7 days (until 3/10 AM dose) and then on day 8 (3/10 evening dose) switch  to one-5mg  tablet twice daily. 1 tablet 0  . buPROPion (WELLBUTRIN XL) 150 MG 24 hr tablet TAKE 1 TABLET BY MOUTH EVERY DAY 90 tablet 1  . Cholecalciferol (VITAMIN D3 PO) Take by mouth every evening.    Marland Kitchen esomeprazole (NEXIUM) 20 MG capsule Take 20 mg by mouth daily as needed.    . fish oil-omega-3 fatty acids 1000 MG capsule Take 2 g by mouth every evening.    . metoprolol succinate (TOPROL-XL) 25 MG 24 hr tablet TAKE 1 TABLET BY MOUTH EVERY DAY 90 tablet 1  . midodrine (PROAMATINE) 5 MG tablet Take 1 tablet (5 mg total) by mouth 3 (three) times daily with meals for 14 days. 42 tablet 0  . Multiple Vitamin (MULTIVITAMIN) tablet Take 1 tablet by mouth every evening.    Marland Kitchen PREMARIN vaginal cream USE 1 TO 2 GRAMS INTRAVAGINALLY TWICE WEEKLY 30 g 2  . sodium chloride (OCEAN) 0.65 %  SOLN nasal spray Place 1 spray into both nostrils as needed for congestion. (Patient not taking: No sig reported) 30 mL 2   No current facility-administered medications for this visit.    Review of Systems  Constitutional: Positive for fever (low grade). Negative for chills, diaphoresis, malaise/fatigue and weight loss.       Feels "a lot better."  HENT: Negative for congestion, ear discharge, ear pain, hearing loss, nosebleeds, sinus pain, sore throat and tinnitus.   Eyes: Negative for blurred vision.  Respiratory: Negative for cough, hemoptysis, sputum production and shortness of breath.        Uses oxygen for a few minutes each day  Cardiovascular: Negative for chest pain, palpitations and leg swelling.  Gastrointestinal: Negative for abdominal pain, blood in stool, constipation, diarrhea, heartburn, melena, nausea and vomiting.  Genitourinary: Positive for frequency and urgency. Negative for dysuria and hematuria.  Musculoskeletal: Negative for back pain, joint pain, myalgias and neck pain.       Stiff, wobbly legs; affects balance.  Skin: Negative for itching and rash.  Neurological: Negative for dizziness,  tingling, sensory change, weakness and headaches.  Endo/Heme/Allergies: Does not bruise/bleed easily.  Psychiatric/Behavioral: Negative for depression and memory loss. The patient is not nervous/anxious and does not have insomnia.   All other systems reviewed and are negative.  Performance status (ECOG): 1  Vitals Blood pressure (!) 155/91, pulse 93, temperature 99.2 F (37.3 C), temperature source Tympanic, resp. rate 18, weight 233 lb (105.7 kg), SpO2 97 %.   Physical Exam Vitals and nursing note reviewed.  Constitutional:      General: She is not in acute distress.    Appearance: She is not diaphoretic.  HENT:     Head: Normocephalic and atraumatic.     Comments: Short red hair    Mouth/Throat:     Mouth: Mucous membranes are moist.     Pharynx: Oropharynx is clear.  Eyes:     General: No scleral icterus.    Extraocular Movements: Extraocular movements intact.     Conjunctiva/sclera: Conjunctivae normal.     Pupils: Pupils are equal, round, and reactive to light.     Comments: Glasses. Blue eyes.  Cardiovascular:     Rate and Rhythm: Normal rate and regular rhythm.     Heart sounds: Normal heart sounds. No murmur heard.   Pulmonary:     Effort: Pulmonary effort is normal. No respiratory distress.     Breath sounds: Normal breath sounds. No wheezing or rales.  Chest:     Chest wall: No tenderness.  Breasts:     Right: No axillary adenopathy or supraclavicular adenopathy.     Left: No axillary adenopathy or supraclavicular adenopathy.    Abdominal:     General: Bowel sounds are normal. There is no distension.     Palpations: Abdomen is soft. There is no mass.     Tenderness: There is no abdominal tenderness. There is no guarding or rebound.  Musculoskeletal:        General: No swelling or tenderness. Normal range of motion.     Cervical back: Normal range of motion and neck supple.  Lymphadenopathy:     Head:     Right side of head: No preauricular, posterior  auricular or occipital adenopathy.     Left side of head: No preauricular, posterior auricular or occipital adenopathy.     Cervical: No cervical adenopathy.     Upper Body:     Right upper body: No supraclavicular  or axillary adenopathy.     Left upper body: No supraclavicular or axillary adenopathy.     Lower Body: No right inguinal adenopathy. No left inguinal adenopathy.  Skin:    General: Skin is warm and dry.  Neurological:     Mental Status: She is alert and oriented to person, place, and time.  Psychiatric:        Behavior: Behavior normal.        Thought Content: Thought content normal.        Judgment: Judgment normal.     No visits with results within 3 Day(s) from this visit.  Latest known visit with results is:  No results displayed because visit has over 200 results.      Assessment:  Michele Garza is a 70 y.o. female with bilateral pulmonary emboli s/p thrombolysis and mechanical thrombectomy on 03/17/2020.  Prior to diagnosis activity level had declined secondary to left Achilles tendon injury.  She is on Eliquis.  Chest CT angiogram on 03/16/2020 revealed acute pulmonary emboli in both main pulmonary arteries and lobar arteries.  There was evidence of right heart strain suspicious for a submassive pulmonary embolism.  There was nonspecific inflammation at the porta hepatis with a small volume of perihepatic ascites.  There was a bulky thyroid goiter goiter with retrosternal extension and moderate compression of the trachea at the thoracic inlet.  Bilateral lower extremity duplex on 03/16/2020 revealed no evidence of a DVT.  Echo on 03/16/2020 revealed an ejection fraction of 55-60% with right ventricular function mildly reduced ventricular size moderately enlarged.    Hypercoagulable work-up included the following normal studies: CBC, Factor V Leiden, prothrombin gene mutation, lupus anticoagulant panel, anti-cardiolipin antibodies, beta-2 glycoprotein antibodies,  protein C activity and antigen, protein S activity and antigen and antithrombin III activity.   Colonoscopy on 02/21/2020 was negative.  Mammogram on 06/24/2019 at [facility] was negative.  Thyroid ultrasound on 03/16/2020 revealed a left-sided thyroid nodule/mass correlating with the prior CT.  Recommendations were for a FNA for histopathology.  She will require thyroidectomy because of the deviation of the trachea and concern for airway problems in the future.  Surgery will be performed when she is off blood thinners.  Thoracic surgery is required as the mass extends in to the mediastinum and down to the aortic arch.    She has a family history of breast and colon cancer.  Her mother had breast cancer x2 (age 68 and 59).  Her father had colon cancer at age 68.  A paternal grandmother also had colon cancer at age 74.  She denies any family history of thrombosis.  The patient received the COVID-19 vaccine and Booster shot.  Symptomatically,  she feels "a lot better". Her breathing has improved; she uses oxygen for a few minutes each day. She reports frequency and urgency. She has been running a low grade fever.  Plan: 1.   Labs today: UA and culture. 2.   Bilateral pulmonary emboli             Precipitating factors likely secondary to inactivity and weight.             Hypercoagulable work-up was negative.             No evidence current evidence of malignancy.   She is up to date on her mammogram and colonoscopy.   Abdominal ultrasound on 03/19/2020 revealed a gallstone and mild increased echogenicity of the liver.   Consider abdomen and pelvis CT.  Review  analogy of a basket floating on the water.  Risk factors for thrombosis are various sized rocks in the basket.                         Enough rocks in the basket and the boat sinks.  Sinking represents thrombosis.              Discuss eliminating risk factors that can be controlled.             Continue Eliquis.             Discuss life long  anticoagulation secondary to life threatening pulmonary embolism. 3.   Normocytic anemia  Hematocrit 39.0.  Hemoglobin 12.8.  MCV 95.8 on 03/16/2020.    Hematocrit 30.8.  Hemoglobin 10.0.  MCV 96.3 on 03/20/2020.  Consider checking ferritin and iron studies. 4.   Increased LFTs  AST 197 and ALT 330 on 03/18/2020.  AST  77  and ALT 224 on 03/20/2020.  Etiology unclear.  Imaging revealed nonspecific inflammation around the porta hepatis.  Anticipate follow-up LFTs as well as possible CT scan as above.  5.   Urinary urgency and frequency  UA and culture. 6.   Follow-up with Dr Derrel Nip on 03/27/2020. 7.   RTC in 3 months for MD assessment and labs (CBC with diff, CMP).  Addendum: Urinalysis revealed large blood, 11-20 RBC/hpf, 6-10 WBC/hpf, nitrite negative and few bacteria.  Urine culture revealed multiple species.  A repeat urinalysis will need to be obtained.  Patient was contacted.  I discussed the assessment and treatment plan with the patient.  The patient was provided an opportunity to ask questions and all were answered.  The patient agreed with the plan and demonstrated an understanding of the instructions.  The patient was advised to call back if the symptoms worsen or if the condition fails to improve as anticipated.  I provided 24 minutes of face-to-face time during this this encounter and > 50% was spent counseling as documented under my assessment and plan. An additional 10 minutes were spent reviewing her chart (Epic and Care Everywhere) including notes, labs, and imaging studies.    Quintina Hakeem C. Mike Gip, MD, PhD    03/24/2020, 1:34 PM  I, Mirian Mo Tufford, am acting as Education administrator for [employer]. Mike Gip, MD, PhD.  I, Waynette Towers C. Mike Gip, MD, have reviewed the above documentation for accuracy and completeness, and I agree with the above.

## 2020-03-24 ENCOUNTER — Encounter: Payer: Self-pay | Admitting: Hematology and Oncology

## 2020-03-24 ENCOUNTER — Other Ambulatory Visit: Payer: Self-pay

## 2020-03-24 ENCOUNTER — Inpatient Hospital Stay: Payer: Medicare PPO | Attending: Hematology and Oncology | Admitting: Hematology and Oncology

## 2020-03-24 ENCOUNTER — Telehealth: Payer: Self-pay

## 2020-03-24 VITALS — BP 155/91 | HR 93 | Temp 99.2°F | Resp 18 | Wt 233.0 lb

## 2020-03-24 DIAGNOSIS — R7989 Other specified abnormal findings of blood chemistry: Secondary | ICD-10-CM

## 2020-03-24 DIAGNOSIS — R350 Frequency of micturition: Secondary | ICD-10-CM

## 2020-03-24 DIAGNOSIS — D649 Anemia, unspecified: Secondary | ICD-10-CM | POA: Diagnosis not present

## 2020-03-24 DIAGNOSIS — I248 Other forms of acute ischemic heart disease: Secondary | ICD-10-CM | POA: Diagnosis not present

## 2020-03-24 DIAGNOSIS — I2609 Other pulmonary embolism with acute cor pulmonale: Secondary | ICD-10-CM | POA: Diagnosis not present

## 2020-03-24 DIAGNOSIS — R3915 Urgency of urination: Secondary | ICD-10-CM | POA: Insufficient documentation

## 2020-03-24 DIAGNOSIS — R319 Hematuria, unspecified: Secondary | ICD-10-CM | POA: Diagnosis not present

## 2020-03-24 DIAGNOSIS — I2699 Other pulmonary embolism without acute cor pulmonale: Secondary | ICD-10-CM | POA: Diagnosis not present

## 2020-03-24 DIAGNOSIS — Z7901 Long term (current) use of anticoagulants: Secondary | ICD-10-CM | POA: Diagnosis not present

## 2020-03-24 LAB — URINALYSIS, COMPLETE (UACMP) WITH MICROSCOPIC
Bilirubin Urine: NEGATIVE
Glucose, UA: NEGATIVE mg/dL
Ketones, ur: 15 mg/dL — AB
Leukocytes,Ua: NEGATIVE
Nitrite: NEGATIVE
Protein, ur: NEGATIVE mg/dL
Specific Gravity, Urine: 1.025 (ref 1.005–1.030)
pH: 5.5 (ref 5.0–8.0)

## 2020-03-24 NOTE — Progress Notes (Signed)
Urinary frequency, patient questions UTI. Patient states that she can tell she is feeling better each day.

## 2020-03-24 NOTE — Telephone Encounter (Signed)
Transition Care Management Unsuccessful Follow-up Telephone Call  Date of discharge and from where:  03/20/20 from [facility]  Attempts:  1st Attempt  Reason for unsuccessful TCM follow-up call:  Unable to leave message. No voicemail. Will follow. HFU scheduled 03/27/20 @ 11:00.

## 2020-03-25 NOTE — Telephone Encounter (Signed)
Transition Care Management Unsuccessful Follow-up Telephone Call  Date of discharge and from where:  03/20/20 from [facility]  Attempts:  2nd Attempt  Reason for unsuccessful TCM follow-up call:  Unable to leave message. Will follow.

## 2020-03-26 ENCOUNTER — Telehealth: Payer: Self-pay

## 2020-03-26 LAB — URINE CULTURE

## 2020-03-26 NOTE — Telephone Encounter (Signed)
-----   Message from Lequita Asal, MD sent at 03/26/2020  1:32 PM EST ----- Regarding: Please call patient  Urine contaminated.  Consider recollection if symptoms present.  M  ----- Message ----- From: Buel Ream, Lab [facility] [place] Sent: 03/24/2020   2:29 PM EST To: Lequita Asal, MD

## 2020-03-26 NOTE — Telephone Encounter (Signed)
Transition Care Management Unsuccessful Follow-up Telephone Call  Date of discharge and from where:  03/20/20 from [facility]  Attempts:  3rd Attempt  Reason for unsuccessful TCM follow-up call:  Left voice message

## 2020-03-27 ENCOUNTER — Ambulatory Visit: Payer: Medicare PPO | Admitting: Internal Medicine

## 2020-03-27 ENCOUNTER — Other Ambulatory Visit: Payer: Self-pay

## 2020-03-27 ENCOUNTER — Encounter: Payer: Self-pay | Admitting: Internal Medicine

## 2020-03-27 VITALS — BP 138/84 | HR 88 | Temp 98.6°F | Resp 16 | Ht 63.0 in | Wt 221.0 lb

## 2020-03-27 DIAGNOSIS — R7989 Other specified abnormal findings of blood chemistry: Secondary | ICD-10-CM | POA: Diagnosis not present

## 2020-03-27 DIAGNOSIS — M25561 Pain in right knee: Secondary | ICD-10-CM | POA: Diagnosis not present

## 2020-03-27 DIAGNOSIS — D649 Anemia, unspecified: Secondary | ICD-10-CM | POA: Insufficient documentation

## 2020-03-27 DIAGNOSIS — R350 Frequency of micturition: Secondary | ICD-10-CM | POA: Insufficient documentation

## 2020-03-27 DIAGNOSIS — F324 Major depressive disorder, single episode, in partial remission: Secondary | ICD-10-CM

## 2020-03-27 DIAGNOSIS — E049 Nontoxic goiter, unspecified: Secondary | ICD-10-CM

## 2020-03-27 DIAGNOSIS — I2609 Other pulmonary embolism with acute cor pulmonale: Secondary | ICD-10-CM

## 2020-03-27 DIAGNOSIS — Z7901 Long term (current) use of anticoagulants: Secondary | ICD-10-CM

## 2020-03-27 DIAGNOSIS — I10 Essential (primary) hypertension: Secondary | ICD-10-CM

## 2020-03-27 DIAGNOSIS — Z09 Encounter for follow-up examination after completed treatment for conditions other than malignant neoplasm: Secondary | ICD-10-CM

## 2020-03-27 DIAGNOSIS — G8929 Other chronic pain: Secondary | ICD-10-CM

## 2020-03-27 LAB — COMPREHENSIVE METABOLIC PANEL
ALT: 45 U/L — ABNORMAL HIGH (ref 0–35)
AST: 20 U/L (ref 0–37)
Albumin: 4.2 g/dL (ref 3.5–5.2)
Alkaline Phosphatase: 61 U/L (ref 39–117)
BUN: 21 mg/dL (ref 6–23)
CO2: 26 mEq/L (ref 19–32)
Calcium: 10.3 mg/dL (ref 8.4–10.5)
Chloride: 100 mEq/L (ref 96–112)
Creatinine, Ser: 1.01 mg/dL (ref 0.40–1.20)
GFR: 57.19 mL/min — ABNORMAL LOW (ref >60.00)
Glucose, Bld: 94 mg/dL (ref 70–99)
Potassium: 4.3 mEq/L (ref 3.5–5.1)
Sodium: 137 mEq/L (ref 135–145)
Total Bilirubin: 0.8 mg/dL (ref 0.2–1.2)
Total Protein: 7.2 g/dL (ref 6.0–8.3)

## 2020-03-27 LAB — CBC WITH DIFFERENTIAL/PLATELET
Basophils Absolute: 0.1 10*3/uL (ref 0.0–0.1)
Basophils Relative: 0.7 % (ref 0.0–3.0)
Eosinophils Absolute: 0.1 10*3/uL (ref 0.0–0.7)
Eosinophils Relative: 1.2 % (ref 0.0–5.0)
HCT: 37.8 % (ref 36.0–46.0)
Hemoglobin: 12.9 g/dL (ref 12.0–15.0)
Lymphocytes Relative: 18.6 % (ref 12.0–46.0)
Lymphs Abs: 1.7 10*3/uL (ref 0.7–4.0)
MCHC: 34.2 g/dL (ref 30.0–36.0)
MCV: 92.6 fl (ref 78.0–100.0)
Monocytes Absolute: 0.8 10*3/uL (ref 0.1–1.0)
Monocytes Relative: 8.9 % (ref 3.0–12.0)
Neutro Abs: 6.5 10*3/uL (ref 1.4–7.7)
Neutrophils Relative %: 70.6 % (ref 43.0–77.0)
Platelets: 353 10*3/uL (ref 150.0–400.0)
RBC: 4.08 Mil/uL (ref 3.87–5.11)
RDW: 13.2 % (ref 11.5–15.5)
WBC: 9.1 10*3/uL (ref 4.0–10.5)

## 2020-03-27 NOTE — Progress Notes (Signed)
Subjective:  Patient ID: Michele Garza, female    DOB: 08-03-1950  Age: 70 y.o. MRN: [id number]  CC: The primary encounter diagnosis was Hospital discharge follow-up. Diagnoses of Elevated LFTs, Encounter for current long-term use of anticoagulants, Major depressive disorder with single episode, in partial remission ([place]), Acute pulmonary embolism with acute cor pulmonale, unspecified pulmonary embolism type ([place]), Retrosternal goiter, Normocytic anemia, Primary hypertension, and Chronic pain of right knee were also pertinent to this visit.  HPI Michele Garza presents for hospital follow up  This visit occurred during the SARS-CoV-2 public health emergency.  Safety protocols were in place, including screening questions prior to the visit, additional usage of staff PPE, and extensive cleaning of exam room while observing appropriate contact time as indicated for disinfecting solutions.   Michele Garza is a 70 yr old female with a history of morbid obesity, hypertension  And achilles tendonitis who was admitted To [facility] on Feb  28 with worsening respiratory failure  And hypotension secondary to bilateral PE.  no march 4 with bilateral PE's , She was taken to  the cath lab for thrombectomy and thrombolysis by Dr Delana Meyer. due to evidence of right heart strain on ECHO.  She was seen by ENT for evaluation of a large retrosternal goiter causing airway deviation.   She was noted to have transaminitis and statin was suspended   She was treated with heparin and transitioned to Eliquis twice daily .  She was discharged home on March 4  with supplemental oxygen , eliquis, and home PT   Hospital events , imaging studies and medications reviewed.  Statin and ARB were suspended and Eliquis was started.   Aortic atherosclerosis and coronary atherosclerosis was noted  Since discharge , her  home pulse readings are  improving from 94 to 98   Pulse is nowo 100 when walking,  On metroprolol.   Losartan held and midodrine tid  started   Mobility:  Using walker .  Had bilateral knee injections by orthopedics Kraskinski in mid December.  appt is next wednesdy fo rinjections needs to knoe   Retrosternal  goiter: seeing Juengel   Son Catalina Antigua stayed the first few nights, lives in [place].  her brother stayed with her. For a few days .  Has been alone for the last few days       Outpatient Medications Prior to Visit  Medication Sig Dispense Refill  . APIXABAN (ELIQUIS) VTE STARTER PACK (10MG  AND 5MG ) Take as directed on package: start with two-5mg  tablets twice daily for 7 days (until 3/10 AM dose) and then on day 8 (3/10 evening dose) switch to one-5mg  tablet twice daily. 1 tablet 0  . buPROPion (WELLBUTRIN XL) 150 MG 24 hr tablet TAKE 1 TABLET BY MOUTH EVERY DAY 90 tablet 1  . Cholecalciferol (VITAMIN D3 PO) Take by mouth every evening.    Marland Kitchen esomeprazole (NEXIUM) 20 MG capsule Take 20 mg by mouth daily as needed.    . fish oil-omega-3 fatty acids 1000 MG capsule Take 2 g by mouth every evening.    . metoprolol succinate (TOPROL-XL) 25 MG 24 hr tablet TAKE 1 TABLET BY MOUTH EVERY DAY 90 tablet 1  . midodrine (PROAMATINE) 5 MG tablet Take 1 tablet (5 mg total) by mouth 3 (three) times daily with meals for 14 days. 42 tablet 0  . Multiple Vitamin (MULTIVITAMIN) tablet Take 1 tablet by mouth every evening.    Marland Kitchen PREMARIN vaginal cream USE 1 TO 2 GRAMS INTRAVAGINALLY TWICE WEEKLY  30 g 2  . sodium chloride (OCEAN) 0.65 % SOLN nasal spray Place 1 spray into both nostrils as needed for congestion. 30 mL 2   No facility-administered medications prior to visit.    Review of Systems;  Patient denies headache, fevers, malaise, unintentional weight loss, skin rash, eye pain, sinus congestion and sinus pain, sore throat, dysphagia,  hemoptysis , cough, dyspnea, wheezing, chest pain, palpitations, orthopnea, edema, abdominal pain, nausea, melena, diarrhea, constipation, flank pain, dysuria, hematuria, urinary  Frequency, nocturia,  numbness, tingling, seizures,  Focal weakness, Loss of consciousness,  Tremor, insomnia, depression, anxiety, and suicidal ideation.      Objective:  BP 138/84 (BP Location: Left Arm, Patient Position: Sitting, Cuff Size: Large)   Pulse 88   Temp 98.6 F (37 C) (Oral)   Resp 16   Ht 5\' 3"  (1.6 m)   Wt 221 lb (100.2 kg)   SpO2 97%   BMI 39.15 kg/m   BP Readings from Last 3 Encounters:  03/27/20 138/84  03/24/20 (!) 155/91  03/20/20 (!) 149/61    Wt Readings from Last 3 Encounters:  03/27/20 221 lb (100.2 kg)  03/24/20 233 lb (105.7 kg)  03/20/20 237 lb 14.4 oz (107.9 kg)    General appearance: alert, cooperative and appears stated age Ears: normal TM's and external ear canals both ears Throat: lips, mucosa, and tongue normal; teeth and gums normal Neck: no adenopathy, no carotid bruit, supple, symmetrical, trachea midline and thyroid not enlarged, symmetric, no tenderness/mass/nodules Back: symmetric, no curvature. ROM normal. No CVA tenderness. Lungs: clear to auscultation bilaterally Heart: regular rate and rhythm, S1, S2 normal, no murmur, click, rub or gallop Abdomen: soft, non-tender; bowel sounds normal; no masses,  no organomegaly Pulses: 2+ and symmetric Skin: Skin color, texture, turgor normal. No rashes or lesions Lymph nodes: Cervical, supraclavicular, and axillary nodes normal.  Lab Results  Component Value Date   HGBA1C 5.7 10/17/2019   HGBA1C 5.7 04/17/2019   HGBA1C 5.6 09/07/2018    Lab Results  Component Value Date   CREATININE 1.01 03/27/2020   CREATININE 0.84 03/20/2020   CREATININE 0.85 03/19/2020    Lab Results  Component Value Date   WBC 9.1 03/27/2020   HGB 12.9 03/27/2020   HCT 37.8 03/27/2020   PLT 353.0 03/27/2020   GLUCOSE 94 03/27/2020   CHOL 170 10/17/2019   TRIG 67.0 10/17/2019   HDL 57.90 10/17/2019   LDLDIRECT 108.0 11/11/2016   LDLCALC 99 10/17/2019   ALT 45 (H) 03/27/2020   AST 20 03/27/2020   NA 137 03/27/2020   K  4.3 03/27/2020   CL 100 03/27/2020   CREATININE 1.01 03/27/2020   BUN 21 03/27/2020   CO2 26 03/27/2020   TSH 0.555 03/16/2020   INR 1.1 03/16/2020   HGBA1C 5.7 10/17/2019   MICROALBUR <0.2 12/08/2017    CT Angio Chest PE W/Cm &/Or Wo Cm  Result Date: 03/16/2020 CLINICAL DATA:  71 year old female with syncopal episode while using the bathroom. EXAM: CT ANGIOGRAPHY CHEST WITH CONTRAST TECHNIQUE: Multidetector CT imaging of the chest was performed using the standard protocol during bolus administration of intravenous contrast. Multiplanar CT image reconstructions and MIPs were obtained to evaluate the vascular anatomy. CONTRAST:  37mL OMNIPAQUE IOHEXOL 350 MG/ML SOLN COMPARISON:  None. FINDINGS: Cardiovascular: Excellent contrast bolus timing in the pulmonary arterial tree. Positive bilateral distal main pulmonary artery and hilar pulmonary embolus (series 5, images 95, 109. Clot extends into all lobar branches. There is no saddle embolus. Associated abnormal  RV/LV ratio = greater than 1.5. No pericardial effusion. Little contrast in the aorta. Calcified coronary artery atherosclerosis. Mediastinum/Nodes: Thyroid goiter with retrosternal extension of partially calcified bulky left lobe. No mediastinal lymphadenopathy. Lungs/Pleura: Trace pleural fluid. Dependent ground-glass opacity in both lungs but no confluent pulmonary infarct or consolidation. Moderate narrowing of the trachea at the thoracic inlet related to bulky left thyroid goiter (series 6, image 12). Otherwise the major airways are patent. Upper Abdomen: Suggestion of nonspecific inflammation at the porta hepatis in the visible upper abdomen. Small volume perihepatic ascites with simple fluid density. The spleen is not enlarged. Gallbladder might be indistinct, uncertain. Visible pancreas, stomach and kidneys remain within normal limits. There is large bowel diverticulosis at the splenic flexure. Musculoskeletal: Mild probably chronic T4, T5  and T7 mild superior endplate compression. Degenerative appearing Schmorl's nodes occasionally in the lower thoracic spine. No suspicious osseous lesion. Review of the MIP images confirms the above findings. IMPRESSION: 1. Positive for acute pulmonary embolus with clot in both main PAs and lobar arteries. CT evidence of right heart strain (abnormal RV/LV Ratio suspicious for at least submassive (intermediate risk) PE. The presence of right heart strain has been associated with an increased risk of morbidity and mortality. 2. Trace pleural fluid and mild ground-glass opacity. No discrete pulmonary infarct at this time. 3. Evidence also of nonspecific inflammation at the [facility] with small volume Perihepatic Ascites. Questionable inflammation of the gallbladder. Follow-up CT Abdomen and Pelvis (IV contrast preferred) recommended when feasible. 4. Bulky thyroid goiter with retrosternal extension and moderate compression of the trachea at the thoracic inlet. 5. Calcified coronary artery atherosclerosis. Aortic Atherosclerosis (ICD10-I70.0). Critical Value/emergent results were called by telephone at the time of interpretation on 03/16/2020 at 4:41 am to Dr. Hinda Kehr , who verbally acknowledged these results. Electronically Signed   By: Genevie Ann M.D.   On: 03/16/2020 04:45   PERIPHERAL VASCULAR CATHETERIZATION  Result Date: 03/17/2020 See op note  US Venous Img Lower Bilateral (DVT)  Result Date: 03/16/2020 CLINICAL DATA:  History of pulmonary embolism.  Evaluate for DVT. EXAM: BILATERAL LOWER EXTREMITY VENOUS DOPPLER ULTRASOUND TECHNIQUE: Gray-scale sonography with graded compression, as well as color Doppler and duplex ultrasound were performed to evaluate the lower extremity deep venous systems from the level of the common femoral vein and including the common femoral, femoral, profunda femoral, popliteal and calf veins including the posterior tibial, peroneal and gastrocnemius veins when visible. The  superficial great saphenous vein was also interrogated. Spectral Doppler was utilized to evaluate flow at rest and with distal augmentation maneuvers in the common femoral, femoral and popliteal veins. COMPARISON:  Chest CTA-earlier same day FINDINGS: RIGHT LOWER EXTREMITY Common Femoral Vein: No evidence of thrombus. Normal compressibility, respiratory phasicity and response to augmentation. Saphenofemoral Junction: No evidence of thrombus. Normal compressibility and flow on color Doppler imaging. Profunda Femoral Vein: No evidence of thrombus. Normal compressibility and flow on color Doppler imaging. Femoral Vein: No evidence of thrombus. Normal compressibility, respiratory phasicity and response to augmentation. Popliteal Vein: No evidence of thrombus. Normal compressibility, respiratory phasicity and response to augmentation. Calf Veins: No evidence of thrombus. Normal compressibility and flow on color Doppler imaging. Superficial Great Saphenous Vein: No evidence of thrombus. Normal compressibility. Venous Reflux:  None. Other Findings:  None. LEFT LOWER EXTREMITY Common Femoral Vein: No evidence of thrombus. Normal compressibility, respiratory phasicity and response to augmentation. Saphenofemoral Junction: No evidence of thrombus. Normal compressibility and flow on color Doppler imaging. Profunda Femoral Vein: No  evidence of thrombus. Normal compressibility and flow on color Doppler imaging. Femoral Vein: No evidence of thrombus. Normal compressibility, respiratory phasicity and response to augmentation. Popliteal Vein: No evidence of thrombus. Normal compressibility, respiratory phasicity and response to augmentation. Calf Veins: Appear patent where visualized. Superficial Great Saphenous Vein: No evidence of thrombus. Normal compressibility. Venous Reflux:  None. Other Findings:  None. IMPRESSION: No evidence of DVT within either lower extremity. Electronically Signed   By: Sandi Mariscal M.D.   On: 03/16/2020  09:37   ECHOCARDIOGRAM COMPLETE  Result Date: 03/16/2020    ECHOCARDIOGRAM REPORT   Patient Name:   Michele Garza Date of Exam: 03/16/2020 Medical Rec #:  [id number]      Height:       63.0 in Accession #:    [id number]     Weight:       239.6 lb Date of Birth:  [birth year]      BSA:          2.088 m Patient Age:    79 years       BP:           92/66 mmHg Patient Gender: F              HR:           109 bpm. Exam Location:  [facility] Procedure: 2D Echo, Color Doppler, Cardiac Doppler and Intracardiac            Opacification Agent Indications:     I26.09 Pulmonary Embolus  History:         Patient has no prior history of Echocardiogram examinations.                  Risk Factors:Hypertension.  Sonographer:     Charmayne Sheer RDCS (AE) Referring Phys:  [id number] [place] L [place] Diagnosing Phys: Kathlyn Sacramento MD  Sonographer Comments: Technically difficult study due to poor echo windows. TDS due to pt tachycardia throughout study. IMPRESSIONS  1. Left ventricular ejection fraction, by estimation, is 55 to 60%. The left ventricle has normal function. The left ventricle has no regional wall motion abnormalities. Left ventricular diastolic parameters are consistent with Grade I diastolic dysfunction (impaired relaxation).  2. Right ventricular systolic function is mildly reduced. The right ventricular size is moderately enlarged. There is moderately elevated pulmonary artery systolic pressure. The estimated right ventricular systolic pressure is 29.5 mmHg.  3. The mitral valve is normal in structure. No evidence of mitral valve regurgitation. No evidence of mitral stenosis.  4. The aortic valve is normal in structure. Aortic valve regurgitation is not visualized. No aortic stenosis is present.  5. The inferior vena cava is dilated in size with <50% respiratory variability, suggesting right atrial pressure of 15 mmHg. FINDINGS  Left Ventricle: Left ventricular ejection fraction, by estimation, is 55 to 60%. The left  ventricle has normal function. The left ventricle has no regional wall motion abnormalities. Definity contrast agent was given IV to delineate the left ventricular  endocardial borders. The left ventricular internal cavity size was normal in size. There is no left ventricular hypertrophy. Left ventricular diastolic parameters are consistent with Grade I diastolic dysfunction (impaired relaxation). Right Ventricle: The right ventricular size is moderately enlarged. No increase in right ventricular wall thickness. Right ventricular systolic function is mildly reduced. There is moderately elevated pulmonary artery systolic pressure. The tricuspid regurgitant velocity is 3.00 m/s, and with an assumed right atrial pressure of 15 mmHg, the estimated right ventricular systolic pressure  is 51.0 mmHg. Left Atrium: Left atrial size was normal in size. Right Atrium: Right atrial size was normal in size. Pericardium: There is no evidence of pericardial effusion. Mitral Valve: The mitral valve is normal in structure. No evidence of mitral valve regurgitation. No evidence of mitral valve stenosis. MV peak gradient, 4.2 mmHg. The mean mitral valve gradient is 1.0 mmHg. Tricuspid Valve: The tricuspid valve is normal in structure. Tricuspid valve regurgitation is mild . No evidence of tricuspid stenosis. Aortic Valve: The aortic valve is normal in structure. Aortic valve regurgitation is not visualized. No aortic stenosis is present. Aortic valve mean gradient measures 2.0 mmHg. Aortic valve peak gradient measures 4.6 mmHg. Aortic valve area, by VTI measures 2.25 cm. Pulmonic Valve: The pulmonic valve was normal in structure. Pulmonic valve regurgitation is not visualized. No evidence of pulmonic stenosis. Aorta: The aortic root is normal in size and structure. Venous: The inferior vena cava is dilated in size with less than 50% respiratory variability, suggesting right atrial pressure of 15 mmHg. IAS/Shunts: No atrial level shunt  detected by color flow Doppler.  LEFT VENTRICLE PLAX 2D LVIDd:         3.20 cm  Diastology LVIDs:         2.10 cm  LV e' medial:    4.46 cm/s LV PW:         1.20 cm  LV E/e' medial:  9.7 LV IVS:        0.80 cm  LV e' lateral:   7.51 cm/s LVOT diam:     2.00 cm  LV E/e' lateral: 5.8 LV SV:         31 LV SV Index:   15 LVOT Area:     3.14 cm  LEFT ATRIUM         Index LA diam:    2.60 cm 1.25 cm/m  AORTIC VALVE                   PULMONIC VALVE AV Area (Vmax):    2.05 cm    PV Vmax:       0.58 m/s AV Area (Vmean):   2.06 cm    PV Vmean:      43.600 cm/s AV Area (VTI):     2.25 cm    PV VTI:        0.104 m AV Vmax:           107.00 cm/s PV Peak grad:  1.4 mmHg AV Vmean:          68.200 cm/s PV Mean grad:  1.0 mmHg AV VTI:            0.137 m AV Peak Grad:      4.6 mmHg AV Mean Grad:      2.0 mmHg LVOT Vmax:         69.80 cm/s LVOT Vmean:        44.800 cm/s LVOT VTI:          0.098 m LVOT/AV VTI ratio: 0.72  AORTA Ao Root diam: 3.60 cm MITRAL VALVE               TRICUSPID VALVE MV Area (PHT): 6.07 cm    TR Peak grad:   36.0 mmHg MV Area VTI:   2.61 cm    TR Vmax:        300.00 cm/s MV Peak grad:  4.2 mmHg MV Mean grad:  1.0 mmHg    SHUNTS MV  Vmax:       1.03 m/s    Systemic VTI:  0.10 m MV Vmean:      53.3 cm/s   Systemic Diam: 2.00 cm MV Decel Time: 125 msec MV E velocity: 43.30 cm/s MV A velocity: 76.30 cm/s MV E/A ratio:  0.57 Kathlyn Sacramento MD Electronically signed by Kathlyn Sacramento MD Signature Date/Time: 03/16/2020/2:02:53 PM    Final    US THYROID  Result Date: 03/16/2020 CLINICAL DATA:  Incidental on CT. Goiter incidentally noted on chest CT EXAM: THYROID ULTRASOUND TECHNIQUE: Ultrasound examination of the thyroid gland and adjacent soft tissues was performed. COMPARISON:  Chest CT-earlier same day FINDINGS: Parenchymal Echotexture: Mildly heterogenous Isthmus: Borderline enlarged measures 0.8 cm in diameter Right lobe: Slightly atrophic measuring 4.3 x 1.4 x 1.5 cm Left lobe: Enlarged measuring 7.3 x  3.9 x 2.8 cm _________________________________________________________ Estimated total number of nodules >/= 1 cm: 1 Number of spongiform nodules >/=  2 cm not described below (TR1): 0 Number of mixed cystic and solid nodules >/= 1.5 cm not described below (TR2): 0 _________________________________________________________ Nodule # 1: Location: Left; Inferior - this nodule correlates with the nodule seen on preceding chest CT Maximum size: 4.1 cm; Other 2 dimensions: 3.4 x 3.1 cm (nodule estimated to measure at least 4.1 cm as there is significant substernal extension demonstrated on preceding chest CT) Composition: solid/almost completely solid (2) Echogenicity: isoechoic (1) Shape: taller-than-wide (3) Margins: ill-defined (0) Echogenic foci: none (0) ACR TI-RADS total points: 6. ACR TI-RADS risk category: TR4 (4-6 points). ACR TI-RADS recommendations: **Given size (>/= 1.5 cm) and appearance, fine needle aspiration of this moderately suspicious nodule should be considered based on TI-RADS criteria. _________________________________________________________ IMPRESSION: Left-sided thyroid nodule/mass correlating with the nodule seen on preceding chest CT meets imaging criteria to recommend percutaneous sampling as indicated. The above is in keeping with the ACR TI-RADS recommendations - J Am Coll Radiol 2017;14:587-595. Electronically Signed   By: Sandi Mariscal M.D.   On: 03/16/2020 09:42    Assessment & Plan:   Problem List Items Addressed This Visit      Unprioritized   Hypertension    Losartan and metoprolol were suspended due to severe hypotension and midodrine was started.  BP has normalized since discharge. Will begin weaning midodrine       Major depressive disorder in partial remission ([place])    Her symptoms are well controlled,  She feels gratitude for surviving her PE.  No changes to medication today       Chronic pain of right knee    She is scheduled for an I/A knee injection next week.   Will let orthopedics know she is taking heparin       Pulmonary emboli (HCC)    Bilateral , involving both main pulmonary arteries and with right heart strain, s/p thrombectomy/thrombolysis on March 1 .  She will continue Eliquis for now,  Pending full hypercoag workup by Hematology.  The PE is considered provoke beuase of her relative immobilization due to achilles tendon injury      Elevated LFTs    Noted during hospitalization, with steady improvement based on repeat assessment today   Lab Results  Component Value Date   ALT 45 (H) 03/27/2020   AST 20 03/27/2020   ALKPHOS 61 03/27/2020   BILITOT 0.8 03/27/2020         Relevant Orders   Comprehensive metabolic panel (Completed)   Normocytic anemia    Rresolved with rechecking since starting eliquis  Lab Results  Component Value Date   WBC 9.1 03/27/2020   HGB 12.9 03/27/2020   HCT 37.8 03/27/2020   MCV 92.6 03/27/2020   PLT 353.0 03/27/2020         Retrosternal goiter    For eventual removal given evidence  of airway deviation .  She will follow up with [facility].       Hospital discharge follow-up - Primary    Patient is stable post discharge and has no new issues or questions about discharge plans at the visit today for hospital follow up. All labs , imaging studies and progress notes from admission were reviewed with patient today         Other Visit Diagnoses    Encounter for current long-term use of anticoagulants       Relevant Orders   CBC with Differential/Platelet (Completed)      I am having Michele Garza maintain her multivitamin, fish oil-omega-3 fatty acids, Premarin, metoprolol succinate, sodium chloride, buPROPion, Cholecalciferol (VITAMIN D3 PO), esomeprazole, midodrine, and Apixaban Starter Pack (10mg  and 5mg ).  No orders of the defined types were placed in this encounter.   There are no discontinued medications.  Follow-up: No follow-ups on file.   Crecencio Mc, MD

## 2020-03-27 NOTE — Patient Instructions (Addendum)
Thank God you are ok!!    I'll let Dr Mack Guise know about your conditio  Your blood pressure is now ready to start weaning off the Midodrine  Please purchase an Omran BP machine and send me readings on Monday

## 2020-03-29 DIAGNOSIS — E049 Nontoxic goiter, unspecified: Secondary | ICD-10-CM | POA: Insufficient documentation

## 2020-03-29 DIAGNOSIS — Z09 Encounter for follow-up examination after completed treatment for conditions other than malignant neoplasm: Secondary | ICD-10-CM | POA: Insufficient documentation

## 2020-03-29 NOTE — Assessment & Plan Note (Signed)
Patient is stable post discharge and has no new issues or questions about discharge plans at the visit today for hospital follow up. All labs , imaging studies and progress notes from admission were reviewed with patient today   

## 2020-03-29 NOTE — Assessment & Plan Note (Signed)
For eventual removal given evidence  of airway deviation .  She will follow up with [facility].

## 2020-03-29 NOTE — Assessment & Plan Note (Signed)
Losartan and metoprolol were suspended due to severe hypotension and midodrine was started.  BP has normalized since discharge. Will begin weaning midodrine

## 2020-03-29 NOTE — Assessment & Plan Note (Signed)
Rresolved with rechecking since starting eliquis  Lab Results  Component Value Date   WBC 9.1 03/27/2020   HGB 12.9 03/27/2020   HCT 37.8 03/27/2020   MCV 92.6 03/27/2020   PLT 353.0 03/27/2020

## 2020-03-29 NOTE — Assessment & Plan Note (Signed)
She is scheduled for an I/A knee injection next week.  Will let orthopedics know she is taking heparin

## 2020-03-29 NOTE — Assessment & Plan Note (Signed)
Noted during hospitalization, with steady improvement based on repeat assessment today   Lab Results  Component Value Date   ALT 45 (H) 03/27/2020   AST 20 03/27/2020   ALKPHOS 61 03/27/2020   BILITOT 0.8 03/27/2020

## 2020-03-29 NOTE — Assessment & Plan Note (Signed)
Bilateral , involving both main pulmonary arteries and with right heart strain, s/p thrombectomy/thrombolysis on March 1 .  She will continue Eliquis for now,  Pending full hypercoag workup by Hematology.  The PE is considered provoke beuase of her relative immobilization due to achilles tendon injury

## 2020-03-29 NOTE — Assessment & Plan Note (Addendum)
Her symptoms are well controlled,  She feels gratitude for surviving her PE.  No changes to medication today

## 2020-03-31 ENCOUNTER — Encounter: Payer: Self-pay | Admitting: Internal Medicine

## 2020-03-31 DIAGNOSIS — I959 Hypotension, unspecified: Secondary | ICD-10-CM | POA: Insufficient documentation

## 2020-04-01 DIAGNOSIS — M170 Bilateral primary osteoarthritis of knee: Secondary | ICD-10-CM | POA: Diagnosis not present

## 2020-04-03 DIAGNOSIS — E041 Nontoxic single thyroid nodule: Secondary | ICD-10-CM | POA: Diagnosis not present

## 2020-04-03 DIAGNOSIS — D383 Neoplasm of uncertain behavior of mediastinum: Secondary | ICD-10-CM | POA: Diagnosis not present

## 2020-04-04 ENCOUNTER — Other Ambulatory Visit: Payer: Self-pay | Admitting: Internal Medicine

## 2020-04-06 ENCOUNTER — Other Ambulatory Visit: Payer: Self-pay | Admitting: Internal Medicine

## 2020-04-06 MED ORDER — METOPROLOL SUCCINATE ER 25 MG PO TB24: 50.0000 mg | ORAL_TABLET | Freq: Every day | ORAL | 1 refills | Status: DC

## 2020-04-07 DIAGNOSIS — E041 Nontoxic single thyroid nodule: Secondary | ICD-10-CM | POA: Diagnosis not present

## 2020-04-07 DIAGNOSIS — I2699 Other pulmonary embolism without acute cor pulmonale: Secondary | ICD-10-CM | POA: Diagnosis not present

## 2020-04-07 DIAGNOSIS — R188 Other ascites: Secondary | ICD-10-CM | POA: Diagnosis not present

## 2020-04-11 ENCOUNTER — Other Ambulatory Visit: Payer: Self-pay | Admitting: Internal Medicine

## 2020-04-15 ENCOUNTER — Ambulatory Visit: Payer: Medicare PPO | Admitting: Internal Medicine

## 2020-04-15 ENCOUNTER — Other Ambulatory Visit: Payer: Self-pay

## 2020-04-15 ENCOUNTER — Encounter: Payer: Self-pay | Admitting: Internal Medicine

## 2020-04-15 DIAGNOSIS — F418 Other specified anxiety disorders: Secondary | ICD-10-CM | POA: Diagnosis not present

## 2020-04-15 DIAGNOSIS — I2609 Other pulmonary embolism with acute cor pulmonale: Secondary | ICD-10-CM

## 2020-04-15 DIAGNOSIS — E049 Nontoxic goiter, unspecified: Secondary | ICD-10-CM | POA: Diagnosis not present

## 2020-04-15 DIAGNOSIS — I10 Essential (primary) hypertension: Secondary | ICD-10-CM

## 2020-04-15 MED ORDER — ALPRAZOLAM 0.25 MG PO TABS: 0.2500 mg | ORAL_TABLET | Freq: Every day | ORAL | 0 refills | Status: DC | PRN

## 2020-04-15 MED ORDER — APIXABAN 5 MG PO TABS: 5.0000 mg | ORAL_TABLET | Freq: Two times a day (BID) | ORAL | 0 refills | Status: DC

## 2020-04-15 MED ORDER — LOSARTAN POTASSIUM 25 MG PO TABS: 25.0000 mg | ORAL_TABLET | Freq: Every day | ORAL | 1 refills | Status: DC

## 2020-04-15 MED ORDER — METOPROLOL SUCCINATE ER 50 MG PO TB24: 50.0000 mg | ORAL_TABLET | Freq: Every day | ORAL | 1 refills | Status: DC

## 2020-04-15 MED ORDER — ALPRAZOLAM 0.25 MG PO TABS: 0.2500 mg | ORAL_TABLET | Freq: Two times a day (BID) | ORAL | 0 refills | Status: DC | PRN

## 2020-04-15 MED ORDER — APIXABAN 5 MG PO TABS
5.0000 mg | ORAL_TABLET | Freq: Two times a day (BID) | ORAL | 1 refills | Status: DC
Start: 2020-05-11 — End: 2020-07-23

## 2020-04-15 NOTE — Patient Instructions (Addendum)
For your blood pressure:   Resume losartan 25 mg daily in the evening,  and continue metoprolol 50 mg daily in the morning   goal is 120/70  Ok to resume [employer] as exercise  As tolerated   Ok to drive !    I have refilled alprAZOLAM "just in case"   You will stop the Eliquis for the thyroid surgery and you will take Lovenox shots as  "bridge" (either your surgeon or I will handle this )

## 2020-04-15 NOTE — Progress Notes (Signed)
Subjective:  Patient ID: Michele Garza, female    DOB: 1950/02/19  Age: 70 y.o. MRN: [id number]  CC: Diagnoses of Retrosternal goiter, Primary hypertension, Acute pulmonary embolism with acute cor pulmonale, unspecified pulmonary embolism type ([place]), and Anxiety about health were pertinent to this visit.  HPI Michele Garza presents for follow up on hypertension,  Recent diagnosis of PE   Hypertension: patient's BP meds were stopped during her recent hospitalization due to severe hypotension and she was discharged on midodrine.  Over the last several weeks she has gradually weaned off midodrine and resumed metoprolol succinate for the past week and now  Using a 50 mg daily dose.  She is checking blood pressure twice weekly at home.  Readings have been for the most part < 140/90 at rest  But pulse is still 100     Patient is following a reduce salt diet most days and is taking medications as prescribed  She is anxious because she has been referred to [facility] by Dr Kathyrn Sheriff for excision of a large thyroid mass presumed to be a large goiter  < 4 cm with substernal extension by recent CT  .   Aortic atherosclerosis:  Noted on CT of chest . Discussed today   Still afraid of having a syncopal episode, which occurred in the setting of bilateral pulmonary emboli causing cardiorespiratory collapse.  She has been afraid to resume driving  Has resumed a walking program cautiously   Outpatient Medications Prior to Visit  Medication Sig Dispense Refill  . buPROPion (WELLBUTRIN XL) 150 MG 24 hr tablet TAKE 1 TABLET BY MOUTH EVERY DAY 90 tablet 1  . Cholecalciferol (VITAMIN D3 PO) Take by mouth every evening.    Marland Kitchen esomeprazole (NEXIUM) 20 MG capsule Take 20 mg by mouth daily as needed.    . fish oil-omega-3 fatty acids 1000 MG capsule Take 2 g by mouth every evening.    . Multiple Vitamin (MULTIVITAMIN) tablet Take 1 tablet by mouth every evening.    Marland Kitchen PREMARIN vaginal cream USE 1 TO 2 GRAMS INTRAVAGINALLY  TWICE WEEKLY 30 g 2  . APIXABAN (ELIQUIS) VTE STARTER PACK (10MG  AND 5MG ) Take as directed on package: start with two-5mg  tablets twice daily for 7 days (until 3/10 AM dose) and then on day 8 (3/10 evening dose) switch to one-5mg  tablet twice daily. 1 tablet 0  . metoprolol succinate (TOPROL-XL) 25 MG 24 hr tablet Take 2 tablets (50 mg total) by mouth daily. 90 tablet 1  . sodium chloride (OCEAN) 0.65 % SOLN nasal spray Place 1 spray into both nostrils as needed for congestion. 30 mL 2   No facility-administered medications prior to visit.    Review of Systems;  Patient denies headache, fevers, malaise, unintentional weight loss, skin rash, eye pain, sinus congestion and sinus pain, sore throat, dysphagia,  hemoptysis , cough, dyspnea, wheezing, chest pain, palpitations, orthopnea, edema, abdominal pain, nausea, melena, diarrhea, constipation, flank pain, dysuria, hematuria, urinary  Frequency, nocturia, numbness, tingling, seizures,  Focal weakness, Loss of consciousness,  Tremor, insomnia, depression, anxiety, and suicidal ideation.      Objective:  BP 138/90 (BP Location: Left Arm, Patient Position: Sitting, Cuff Size: Large)   Pulse (!) 101   Temp 98.3 F (36.8 C) (Oral)   Resp 16   Ht 5\' 3"  (1.6 m)   Wt 219 lb 9.6 oz (99.6 kg)   SpO2 97%   BMI 38.90 kg/m   BP Readings from Last 3 Encounters:  04/15/20 138/90  03/27/20 138/84  03/24/20 (!) 155/91    Wt Readings from Last 3 Encounters:  04/15/20 219 lb 9.6 oz (99.6 kg)  03/27/20 221 lb (100.2 kg)  03/24/20 233 lb (105.7 kg)    General appearance: alert, cooperative and appears stated age Ears: normal TM's and external ear canals both ears Throat: lips, mucosa, and tongue normal; teeth and gums normal Neck: no adenopathy, no carotid bruit, supple, symmetrical, trachea midline and thyroid not enlarged, symmetric, no tenderness/mass/nodules Back: symmetric, no curvature. ROM normal. No CVA tenderness. Lungs: clear to  auscultation bilaterally Heart: regular rate and rhythm, S1, S2 normal, no murmur, click, rub or gallop Abdomen: soft, non-tender; bowel sounds normal; no masses,  no organomegaly Pulses: 2+ and symmetric Skin: Skin color, texture, turgor normal. No rashes or lesions Lymph nodes: Cervical, supraclavicular, and axillary nodes normal.  Lab Results  Component Value Date   HGBA1C 5.7 10/17/2019   HGBA1C 5.7 04/17/2019   HGBA1C 5.6 09/07/2018    Lab Results  Component Value Date   CREATININE 1.01 03/27/2020   CREATININE 0.84 03/20/2020   CREATININE 0.85 03/19/2020    Lab Results  Component Value Date   WBC 9.1 03/27/2020   HGB 12.9 03/27/2020   HCT 37.8 03/27/2020   PLT 353.0 03/27/2020   GLUCOSE 94 03/27/2020   CHOL 170 10/17/2019   TRIG 67.0 10/17/2019   HDL 57.90 10/17/2019   LDLDIRECT 108.0 11/11/2016   LDLCALC 99 10/17/2019   ALT 45 (H) 03/27/2020   AST 20 03/27/2020   NA 137 03/27/2020   K 4.3 03/27/2020   CL 100 03/27/2020   CREATININE 1.01 03/27/2020   BUN 21 03/27/2020   CO2 26 03/27/2020   TSH 0.555 03/16/2020   INR 1.1 03/16/2020   HGBA1C 5.7 10/17/2019   MICROALBUR <0.2 12/08/2017    CT Angio Chest PE W/Cm &/Or Wo Cm  Result Date: 03/16/2020 CLINICAL DATA:  71 year old female with syncopal episode while using the bathroom. EXAM: CT ANGIOGRAPHY CHEST WITH CONTRAST TECHNIQUE: Multidetector CT imaging of the chest was performed using the standard protocol during bolus administration of intravenous contrast. Multiplanar CT image reconstructions and MIPs were obtained to evaluate the vascular anatomy. CONTRAST:  39mL OMNIPAQUE IOHEXOL 350 MG/ML SOLN COMPARISON:  None. FINDINGS: Cardiovascular: Excellent contrast bolus timing in the pulmonary arterial tree. Positive bilateral distal main pulmonary artery and hilar pulmonary embolus (series 5, images 95, 109. Clot extends into all lobar branches. There is no saddle embolus. Associated abnormal RV/LV ratio = greater  than 1.5. No pericardial effusion. Little contrast in the aorta. Calcified coronary artery atherosclerosis. Mediastinum/Nodes: Thyroid goiter with retrosternal extension of partially calcified bulky left lobe. No mediastinal lymphadenopathy. Lungs/Pleura: Trace pleural fluid. Dependent ground-glass opacity in both lungs but no confluent pulmonary infarct or consolidation. Moderate narrowing of the trachea at the thoracic inlet related to bulky left thyroid goiter (series 6, image 12). Otherwise the major airways are patent. Upper Abdomen: Suggestion of nonspecific inflammation at the porta hepatis in the visible upper abdomen. Small volume perihepatic ascites with simple fluid density. The spleen is not enlarged. Gallbladder might be indistinct, uncertain. Visible pancreas, stomach and kidneys remain within normal limits. There is large bowel diverticulosis at the splenic flexure. Musculoskeletal: Mild probably chronic T4, T5 and T7 mild superior endplate compression. Degenerative appearing Schmorl's nodes occasionally in the lower thoracic spine. No suspicious osseous lesion. Review of the MIP images confirms the above findings. IMPRESSION: 1. Positive for acute pulmonary embolus with clot in both main PAs  and lobar arteries. CT evidence of right heart strain (abnormal RV/LV Ratio suspicious for at least submassive (intermediate risk) PE. The presence of right heart strain has been associated with an increased risk of morbidity and mortality. 2. Trace pleural fluid and mild ground-glass opacity. No discrete pulmonary infarct at this time. 3. Evidence also of nonspecific inflammation at the [facility] with small volume Perihepatic Ascites. Questionable inflammation of the gallbladder. Follow-up CT Abdomen and Pelvis (IV contrast preferred) recommended when feasible. 4. Bulky thyroid goiter with retrosternal extension and moderate compression of the trachea at the thoracic inlet. 5. Calcified coronary artery  atherosclerosis. Aortic Atherosclerosis (ICD10-I70.0). Critical Value/emergent results were called by telephone at the time of interpretation on 03/16/2020 at 4:41 am to Dr. Hinda Kehr , who verbally acknowledged these results. Electronically Signed   By: Genevie Ann M.D.   On: 03/16/2020 04:45   PERIPHERAL VASCULAR CATHETERIZATION  Result Date: 03/17/2020 See op note  US Venous Img Lower Bilateral (DVT)  Result Date: 03/16/2020 CLINICAL DATA:  History of pulmonary embolism.  Evaluate for DVT. EXAM: BILATERAL LOWER EXTREMITY VENOUS DOPPLER ULTRASOUND TECHNIQUE: Gray-scale sonography with graded compression, as well as color Doppler and duplex ultrasound were performed to evaluate the lower extremity deep venous systems from the level of the common femoral vein and including the common femoral, femoral, profunda femoral, popliteal and calf veins including the posterior tibial, peroneal and gastrocnemius veins when visible. The superficial great saphenous vein was also interrogated. Spectral Doppler was utilized to evaluate flow at rest and with distal augmentation maneuvers in the common femoral, femoral and popliteal veins. COMPARISON:  Chest CTA-earlier same day FINDINGS: RIGHT LOWER EXTREMITY Common Femoral Vein: No evidence of thrombus. Normal compressibility, respiratory phasicity and response to augmentation. Saphenofemoral Junction: No evidence of thrombus. Normal compressibility and flow on color Doppler imaging. Profunda Femoral Vein: No evidence of thrombus. Normal compressibility and flow on color Doppler imaging. Femoral Vein: No evidence of thrombus. Normal compressibility, respiratory phasicity and response to augmentation. Popliteal Vein: No evidence of thrombus. Normal compressibility, respiratory phasicity and response to augmentation. Calf Veins: No evidence of thrombus. Normal compressibility and flow on color Doppler imaging. Superficial Great Saphenous Vein: No evidence of thrombus. Normal  compressibility. Venous Reflux:  None. Other Findings:  None. LEFT LOWER EXTREMITY Common Femoral Vein: No evidence of thrombus. Normal compressibility, respiratory phasicity and response to augmentation. Saphenofemoral Junction: No evidence of thrombus. Normal compressibility and flow on color Doppler imaging. Profunda Femoral Vein: No evidence of thrombus. Normal compressibility and flow on color Doppler imaging. Femoral Vein: No evidence of thrombus. Normal compressibility, respiratory phasicity and response to augmentation. Popliteal Vein: No evidence of thrombus. Normal compressibility, respiratory phasicity and response to augmentation. Calf Veins: Appear patent where visualized. Superficial Great Saphenous Vein: No evidence of thrombus. Normal compressibility. Venous Reflux:  None. Other Findings:  None. IMPRESSION: No evidence of DVT within either lower extremity. Electronically Signed   By: Sandi Mariscal M.D.   On: 03/16/2020 09:37   ECHOCARDIOGRAM COMPLETE  Result Date: 03/16/2020    ECHOCARDIOGRAM REPORT   Patient Name:   MIYU FENDERSON Date of Exam: 03/16/2020 Medical Rec #:  [id number]      Height:       63.0 in Accession #:    [id number]     Weight:       239.6 lb Date of Birth:  10-01-1950      BSA:          2.088 m Patient Age:  69 years       BP:           92/66 mmHg Patient Gender: F              HR:           109 bpm. Exam Location:  [facility] Procedure: 2D Echo, Color Doppler, Cardiac Doppler and Intracardiac            Opacification Agent Indications:     I26.09 Pulmonary Embolus  History:         Patient has no prior history of Echocardiogram examinations.                  Risk Factors:Hypertension.  Sonographer:     Charmayne Sheer RDCS (AE) Referring Phys:  [id number] [place] L [place] Diagnosing Phys: Kathlyn Sacramento MD  Sonographer Comments: Technically difficult study due to poor echo windows. TDS due to pt tachycardia throughout study. IMPRESSIONS  1. Left ventricular ejection fraction, by  estimation, is 55 to 60%. The left ventricle has normal function. The left ventricle has no regional wall motion abnormalities. Left ventricular diastolic parameters are consistent with Grade I diastolic dysfunction (impaired relaxation).  2. Right ventricular systolic function is mildly reduced. The right ventricular size is moderately enlarged. There is moderately elevated pulmonary artery systolic pressure. The estimated right ventricular systolic pressure is 81.8 mmHg.  3. The mitral valve is normal in structure. No evidence of mitral valve regurgitation. No evidence of mitral stenosis.  4. The aortic valve is normal in structure. Aortic valve regurgitation is not visualized. No aortic stenosis is present.  5. The inferior vena cava is dilated in size with <50% respiratory variability, suggesting right atrial pressure of 15 mmHg. FINDINGS  Left Ventricle: Left ventricular ejection fraction, by estimation, is 55 to 60%. The left ventricle has normal function. The left ventricle has no regional wall motion abnormalities. Definity contrast agent was given IV to delineate the left ventricular  endocardial borders. The left ventricular internal cavity size was normal in size. There is no left ventricular hypertrophy. Left ventricular diastolic parameters are consistent with Grade I diastolic dysfunction (impaired relaxation). Right Ventricle: The right ventricular size is moderately enlarged. No increase in right ventricular wall thickness. Right ventricular systolic function is mildly reduced. There is moderately elevated pulmonary artery systolic pressure. The tricuspid regurgitant velocity is 3.00 m/s, and with an assumed right atrial pressure of 15 mmHg, the estimated right ventricular systolic pressure is 56.3 mmHg. Left Atrium: Left atrial size was normal in size. Right Atrium: Right atrial size was normal in size. Pericardium: There is no evidence of pericardial effusion. Mitral Valve: The mitral valve is  normal in structure. No evidence of mitral valve regurgitation. No evidence of mitral valve stenosis. MV peak gradient, 4.2 mmHg. The mean mitral valve gradient is 1.0 mmHg. Tricuspid Valve: The tricuspid valve is normal in structure. Tricuspid valve regurgitation is mild . No evidence of tricuspid stenosis. Aortic Valve: The aortic valve is normal in structure. Aortic valve regurgitation is not visualized. No aortic stenosis is present. Aortic valve mean gradient measures 2.0 mmHg. Aortic valve peak gradient measures 4.6 mmHg. Aortic valve area, by VTI measures 2.25 cm. Pulmonic Valve: The pulmonic valve was normal in structure. Pulmonic valve regurgitation is not visualized. No evidence of pulmonic stenosis. Aorta: The aortic root is normal in size and structure. Venous: The inferior vena cava is dilated in size with less than 50% respiratory variability, suggesting right atrial pressure of 15  mmHg. IAS/Shunts: No atrial level shunt detected by color flow Doppler.  LEFT VENTRICLE PLAX 2D LVIDd:         3.20 cm  Diastology LVIDs:         2.10 cm  LV e' medial:    4.46 cm/s LV PW:         1.20 cm  LV E/e' medial:  9.7 LV IVS:        0.80 cm  LV e' lateral:   7.51 cm/s LVOT diam:     2.00 cm  LV E/e' lateral: 5.8 LV SV:         31 LV SV Index:   15 LVOT Area:     3.14 cm  LEFT ATRIUM         Index LA diam:    2.60 cm 1.25 cm/m  AORTIC VALVE                   PULMONIC VALVE AV Area (Vmax):    2.05 cm    PV Vmax:       0.58 m/s AV Area (Vmean):   2.06 cm    PV Vmean:      43.600 cm/s AV Area (VTI):     2.25 cm    PV VTI:        0.104 m AV Vmax:           107.00 cm/s PV Peak grad:  1.4 mmHg AV Vmean:          68.200 cm/s PV Mean grad:  1.0 mmHg AV VTI:            0.137 m AV Peak Grad:      4.6 mmHg AV Mean Grad:      2.0 mmHg LVOT Vmax:         69.80 cm/s LVOT Vmean:        44.800 cm/s LVOT VTI:          0.098 m LVOT/AV VTI ratio: 0.72  AORTA Ao Root diam: 3.60 cm MITRAL VALVE               TRICUSPID VALVE MV Area  (PHT): 6.07 cm    TR Peak grad:   36.0 mmHg MV Area VTI:   2.61 cm    TR Vmax:        300.00 cm/s MV Peak grad:  4.2 mmHg MV Mean grad:  1.0 mmHg    SHUNTS MV Vmax:       1.03 m/s    Systemic VTI:  0.10 m MV Vmean:      53.3 cm/s   Systemic Diam: 2.00 cm MV Decel Time: 125 msec MV E velocity: 43.30 cm/s MV A velocity: 76.30 cm/s MV E/A ratio:  0.57 Kathlyn Sacramento MD Electronically signed by Kathlyn Sacramento MD Signature Date/Time: 03/16/2020/2:02:53 PM    Final    US THYROID  Result Date: 03/16/2020 CLINICAL DATA:  Incidental on CT. Goiter incidentally noted on chest CT EXAM: THYROID ULTRASOUND TECHNIQUE: Ultrasound examination of the thyroid gland and adjacent soft tissues was performed. COMPARISON:  Chest CT-earlier same day FINDINGS: Parenchymal Echotexture: Mildly heterogenous Isthmus: Borderline enlarged measures 0.8 cm in diameter Right lobe: Slightly atrophic measuring 4.3 x 1.4 x 1.5 cm Left lobe: Enlarged measuring 7.3 x 3.9 x 2.8 cm _________________________________________________________ Estimated total number of nodules >/= 1 cm: 1 Number of spongiform nodules >/=  2 cm not described below (TR1): 0 Number of mixed cystic and solid nodules >/=  1.5 cm not described below (Page): 0 _________________________________________________________ Nodule # 1: Location: Left; Inferior - this nodule correlates with the nodule seen on preceding chest CT Maximum size: 4.1 cm; Other 2 dimensions: 3.4 x 3.1 cm (nodule estimated to measure at least 4.1 cm as there is significant substernal extension demonstrated on preceding chest CT) Composition: solid/almost completely solid (2) Echogenicity: isoechoic (1) Shape: taller-than-wide (3) Margins: ill-defined (0) Echogenic foci: none (0) ACR TI-RADS total points: 6. ACR TI-RADS risk category: TR4 (4-6 points). ACR TI-RADS recommendations: **Given size (>/= 1.5 cm) and appearance, fine needle aspiration of this moderately suspicious nodule should be considered based on  TI-RADS criteria. _________________________________________________________ IMPRESSION: Left-sided thyroid nodule/mass correlating with the nodule seen on preceding chest CT meets imaging criteria to recommend percutaneous sampling as indicated. The above is in keeping with the ACR TI-RADS recommendations - J Am Coll Radiol 2017;14:587-595. Electronically Signed   By: Sandi Mariscal M.D.   On: 03/16/2020 09:42    Assessment & Plan:   Problem List Items Addressed This Visit      Unprioritized   Retrosternal goiter    Referred by Juengel to [facility] for surgical management.  April 6 appt       Anxiety about health    Reassurance provided.  Advised her that seh may resume driving.  Prn alprazolam to use when home for insomnia The risks and benefits of benzodiazepine use were discussed with patient today including excessive sedation leading to respiratory depression,  impaired thinking/driving, and addiction.  Patient was advised to avoid concurrent use with alcohol, to use medication only as needed and not to share with others  .       Relevant Medications   ALPRAZolam (XANAX) 0.25 MG tablet   Hypertension    Continue Toprol XL at 50 mg daily.  Adding back losartan at 25 mg dose.       Relevant Medications   apixaban (ELIQUIS) 5 MG TABS tablet   apixaban (ELIQUIS) 5 MG TABS tablet (Start on 05/11/2020)   losartan (COZAAR) 25 MG tablet   Pulmonary emboli (HCC)    Bilateral , involving both main pulmonary arteries and with right heart strain, s/p thrombectomy/thrombolysis on March 1 .  She will continue Eliquis for 6 months as her full hypercoag workup by Hematology was negative and the PE is considered provoked due to her  relative immobilization due to achilles tendon injury.      Relevant Medications   apixaban (ELIQUIS) 5 MG TABS tablet   apixaban (ELIQUIS) 5 MG TABS tablet (Start on 05/11/2020)   losartan (COZAAR) 25 MG tablet      I have discontinued Michele Garza's sodium chloride,  Apixaban Starter Pack (10mg  and 5mg ), metoprolol succinate, and ALPRAZolam. I am also having her start on apixaban, apixaban, ALPRAZolam, and losartan. Additionally, I am having her maintain her multivitamin, fish oil-omega-3 fatty acids, Premarin, buPROPion, Cholecalciferol (VITAMIN D3 PO), and esomeprazole.  Meds ordered this encounter  Medications  . apixaban (ELIQUIS) 5 MG TABS tablet    Sig: Take 1 tablet (5 mg total) by mouth 2 (two) times daily.    Dispense:  60 tablet    Refill:  0  . apixaban (ELIQUIS) 5 MG TABS tablet    Sig: Take 1 tablet (5 mg total) by mouth 2 (two) times daily.    Dispense:  90 tablet    Refill:  1  . DISCONTD: ALPRAZolam (XANAX) 0.25 MG tablet    Sig: Take 1 tablet (0.25 mg total)  by mouth daily as needed for sleep.    Dispense:  30 tablet    Refill:  0  . ALPRAZolam (XANAX) 0.25 MG tablet    Sig: Take 1 tablet (0.25 mg total) by mouth 2 (two) times daily as needed for anxiety.    Dispense:  20 tablet    Refill:  0  . DISCONTD: metoprolol succinate (TOPROL-XL) 50 MG 24 hr tablet    Sig: Take 1 tablet (50 mg total) by mouth daily.    Dispense:  90 tablet    Refill:  1  . losartan (COZAAR) 25 MG tablet    Sig: Take 1 tablet (25 mg total) by mouth daily.    Dispense:  90 tablet    Refill:  1    Medications Discontinued During This Encounter  Medication Reason  . sodium chloride (OCEAN) 0.65 % SOLN nasal spray   . APIXABAN (ELIQUIS) VTE STARTER PACK (10MG  AND 5MG )   . ALPRAZolam (XANAX) 0.25 MG tablet   . metoprolol succinate (TOPROL-XL) 25 MG 24 hr tablet Reorder    Follow-up: Return in about 3 months (around 07/16/2020).   Crecencio Mc, MD

## 2020-04-15 NOTE — Assessment & Plan Note (Signed)
Referred by Juengel to [facility] for surgical management.  April 6 appt

## 2020-04-17 ENCOUNTER — Other Ambulatory Visit: Payer: Self-pay

## 2020-04-17 MED ORDER — METOPROLOL SUCCINATE ER 50 MG PO TB24: 50.0000 mg | ORAL_TABLET | Freq: Every day | ORAL | 1 refills | Status: DC

## 2020-04-18 DIAGNOSIS — F418 Other specified anxiety disorders: Secondary | ICD-10-CM | POA: Insufficient documentation

## 2020-04-18 NOTE — Assessment & Plan Note (Signed)
Continue Toprol XL at 50 mg daily.  Adding back losartan at 25 mg dose.

## 2020-04-18 NOTE — Assessment & Plan Note (Signed)
Bilateral , involving both main pulmonary arteries and with right heart strain, s/p thrombectomy/thrombolysis on March 1 .  She will continue Eliquis for 6 months as her full hypercoag workup by Hematology was negative and the PE is considered provoked due to her  relative immobilization due to achilles tendon injury.

## 2020-04-18 NOTE — Assessment & Plan Note (Signed)
Reassurance provided.  Advised her that seh may resume driving.  Prn alprazolam to use when home for insomnia The risks and benefits of benzodiazepine use were discussed with patient today including excessive sedation leading to respiratory depression,  impaired thinking/driving, and addiction.  Patient was advised to avoid concurrent use with alcohol, to use medication only as needed and not to share with others  .

## 2020-04-20 MED ORDER — METOPROLOL SUCCINATE ER 50 MG PO TB24: 50.0000 mg | ORAL_TABLET | Freq: Every day | ORAL | 1 refills | Status: DC

## 2020-04-22 ENCOUNTER — Other Ambulatory Visit: Payer: Self-pay | Admitting: Internal Medicine

## 2020-04-22 DIAGNOSIS — E049 Nontoxic goiter, unspecified: Secondary | ICD-10-CM | POA: Diagnosis not present

## 2020-04-22 DIAGNOSIS — E0789 Other specified disorders of thyroid: Secondary | ICD-10-CM | POA: Diagnosis not present

## 2020-04-22 DIAGNOSIS — Z6838 Body mass index (BMI) 38.0-38.9, adult: Secondary | ICD-10-CM | POA: Diagnosis not present

## 2020-04-22 DIAGNOSIS — Z86711 Personal history of pulmonary embolism: Secondary | ICD-10-CM | POA: Diagnosis not present

## 2020-04-23 ENCOUNTER — Telehealth: Payer: Self-pay | Admitting: Internal Medicine

## 2020-04-23 NOTE — Telephone Encounter (Signed)
[employer] called in about certification form need to be filled out and faxed back 2nd fax sent today 04-23-20

## 2020-04-23 NOTE — Telephone Encounter (Signed)
Spoke with pt and she stated that she does not need the O2 concentrator any more. She stated that [place] has already picked up all of her oxygen.

## 2020-04-24 DIAGNOSIS — E042 Nontoxic multinodular goiter: Secondary | ICD-10-CM | POA: Diagnosis not present

## 2020-04-24 DIAGNOSIS — D34 Benign neoplasm of thyroid gland: Secondary | ICD-10-CM | POA: Diagnosis not present

## 2020-04-24 DIAGNOSIS — E041 Nontoxic single thyroid nodule: Secondary | ICD-10-CM | POA: Diagnosis not present

## 2020-05-12 ENCOUNTER — Other Ambulatory Visit: Payer: Self-pay | Admitting: Internal Medicine

## 2020-05-20 DIAGNOSIS — I2699 Other pulmonary embolism without acute cor pulmonale: Secondary | ICD-10-CM | POA: Diagnosis not present

## 2020-05-20 DIAGNOSIS — K6289 Other specified diseases of anus and rectum: Secondary | ICD-10-CM | POA: Diagnosis not present

## 2020-05-22 ENCOUNTER — Other Ambulatory Visit: Payer: Self-pay | Admitting: Internal Medicine

## 2020-06-20 DIAGNOSIS — I2699 Other pulmonary embolism without acute cor pulmonale: Secondary | ICD-10-CM | POA: Diagnosis not present

## 2020-06-20 DIAGNOSIS — K6289 Other specified diseases of anus and rectum: Secondary | ICD-10-CM | POA: Diagnosis not present

## 2020-07-02 ENCOUNTER — Other Ambulatory Visit: Payer: Self-pay

## 2020-07-02 DIAGNOSIS — I2609 Other pulmonary embolism with acute cor pulmonale: Secondary | ICD-10-CM

## 2020-07-02 LAB — HM MAMMOGRAPHY

## 2020-07-07 ENCOUNTER — Other Ambulatory Visit: Payer: Self-pay

## 2020-07-07 ENCOUNTER — Inpatient Hospital Stay: Payer: Medicare PPO | Admitting: Nurse Practitioner

## 2020-07-07 ENCOUNTER — Inpatient Hospital Stay: Payer: Medicare PPO | Attending: Nurse Practitioner

## 2020-07-07 VITALS — BP 131/78 | HR 82 | Temp 97.6°F | Resp 18 | Wt 218.3 lb

## 2020-07-07 DIAGNOSIS — Z809 Family history of malignant neoplasm, unspecified: Secondary | ICD-10-CM | POA: Insufficient documentation

## 2020-07-07 DIAGNOSIS — Z803 Family history of malignant neoplasm of breast: Secondary | ICD-10-CM | POA: Diagnosis not present

## 2020-07-07 DIAGNOSIS — I2699 Other pulmonary embolism without acute cor pulmonale: Secondary | ICD-10-CM | POA: Insufficient documentation

## 2020-07-07 DIAGNOSIS — Z881 Allergy status to other antibiotic agents status: Secondary | ICD-10-CM | POA: Diagnosis not present

## 2020-07-07 DIAGNOSIS — I2609 Other pulmonary embolism with acute cor pulmonale: Secondary | ICD-10-CM

## 2020-07-07 DIAGNOSIS — Z7901 Long term (current) use of anticoagulants: Secondary | ICD-10-CM | POA: Insufficient documentation

## 2020-07-07 DIAGNOSIS — Z800 Family history of malignant neoplasm of digestive organs: Secondary | ICD-10-CM | POA: Insufficient documentation

## 2020-07-07 DIAGNOSIS — Z8249 Family history of ischemic heart disease and other diseases of the circulatory system: Secondary | ICD-10-CM | POA: Diagnosis not present

## 2020-07-07 DIAGNOSIS — Z823 Family history of stroke: Secondary | ICD-10-CM | POA: Insufficient documentation

## 2020-07-07 DIAGNOSIS — R188 Other ascites: Secondary | ICD-10-CM | POA: Diagnosis not present

## 2020-07-07 DIAGNOSIS — I10 Essential (primary) hypertension: Secondary | ICD-10-CM | POA: Diagnosis not present

## 2020-07-07 DIAGNOSIS — E041 Nontoxic single thyroid nodule: Secondary | ICD-10-CM | POA: Insufficient documentation

## 2020-07-07 DIAGNOSIS — Z79899 Other long term (current) drug therapy: Secondary | ICD-10-CM | POA: Insufficient documentation

## 2020-07-07 LAB — COMPREHENSIVE METABOLIC PANEL
ALT: 22 U/L (ref 0–44)
AST: 26 U/L (ref 15–41)
Albumin: 4.2 g/dL (ref 3.5–5.0)
Alkaline Phosphatase: 58 U/L (ref 38–126)
Anion gap: 7 (ref 5–15)
BUN: 15 mg/dL (ref 8–23)
CO2: 29 mmol/L (ref 22–32)
Calcium: 9.9 mg/dL (ref 8.9–10.3)
Chloride: 103 mmol/L (ref 98–111)
Creatinine, Ser: 0.96 mg/dL (ref 0.44–1.00)
GFR, Estimated: >60 mL/min (ref >60)
Glucose, Bld: 106 mg/dL — ABNORMAL HIGH (ref 70–99)
Potassium: 3.5 mmol/L (ref 3.5–5.1)
Sodium: 139 mmol/L (ref 135–145)
Total Bilirubin: 0.4 mg/dL (ref 0.3–1.2)
Total Protein: 7.4 g/dL (ref 6.5–8.1)

## 2020-07-07 LAB — CBC WITH DIFFERENTIAL/PLATELET
Abs Immature Granulocytes: 0.02 10*3/uL (ref 0.00–0.07)
Basophils Absolute: 0 10*3/uL (ref 0.0–0.1)
Basophils Relative: 0 %
Eosinophils Absolute: 0.2 10*3/uL (ref 0.0–0.5)
Eosinophils Relative: 3 %
HCT: 39.1 % (ref 36.0–46.0)
Hemoglobin: 13.5 g/dL (ref 12.0–15.0)
Immature Granulocytes: 0 %
Lymphocytes Relative: 38 %
Lymphs Abs: 3 10*3/uL (ref 0.7–4.0)
MCH: 31.1 pg (ref 26.0–34.0)
MCHC: 34.5 g/dL (ref 30.0–36.0)
MCV: 90.1 fL (ref 80.0–100.0)
Monocytes Absolute: 0.6 10*3/uL (ref 0.1–1.0)
Monocytes Relative: 8 %
Neutro Abs: 4.1 10*3/uL (ref 1.7–7.7)
Neutrophils Relative %: 51 %
Platelets: 216 10*3/uL (ref 150–400)
RBC: 4.34 MIL/uL (ref 3.87–5.11)
RDW: 13.4 % (ref 11.5–15.5)
WBC: 8 10*3/uL (ref 4.0–10.5)
nRBC: 0 % (ref 0.0–0.2)

## 2020-07-07 NOTE — Progress Notes (Signed)
[facility]  [address] [place], [place] [address] Phone: [phone]  Fax: [phone]   Clinic Day:  07/07/2020  Referring physician: Crecencio Mc, MD  Chief Complaint: Michele Garza is a 70 y.o. female with bilateral pulmonary emboli is seen for 24-month evaluation  HPI:   Michele Garza is a 70 y.o. female with bilateral pulmonary emboli s/p thrombolysis and mechanical thrombectomy on 03/17/2020.  Prior to diagnosis activity level had declined secondary to left Achilles tendon injury.  She is on Eliquis.   Chest CT angiogram on 03/16/2020 revealed acute pulmonary emboli in both main pulmonary arteries and lobar arteries.  There was evidence of right heart strain suspicious for a submassive pulmonary embolism.  There was nonspecific inflammation at the porta hepatis with a small volume of perihepatic ascites.  There was a bulky thyroid goiter goiter with retrosternal extension and moderate compression of the trachea at the thoracic inlet.   Bilateral lower extremity duplex on 03/16/2020 revealed no evidence of a DVT.  Echo on 03/16/2020 revealed an ejection fraction of 55-60% with right ventricular function mildly reduced ventricular size moderately enlarged.     Hypercoagulable work-up included the following normal studies: CBC, Factor V Leiden, prothrombin gene mutation, lupus anticoagulant panel, anti-cardiolipin antibodies, beta-2 glycoprotein antibodies, protein C activity and antigen, protein S activity and antigen and antithrombin III activity.    Colonoscopy on 02/21/2020 was negative.  Mammogram on 06/24/2019 at [facility] was negative.   Thyroid ultrasound on 03/16/2020 revealed a left-sided thyroid nodule/mass correlating with the prior CT.  Recommendations were for a FNA for histopathology.  She will require thyroidectomy because of the deviation of the trachea and concern for airway problems in the future.  Surgery will be performed when she  is off blood thinners.  Thoracic surgery is required as the mass extends in to the mediastinum and down to the aortic arch.    She has a family history of breast and colon cancer.  Her mother had breast cancer x2 (age 84 and 84).  Her father had colon cancer at age 50.  A paternal grandmother also had colon cancer at age 46.  She denies any family history of thrombosis.  The patient received the COVID-19 vaccine and Booster shot.  Interval History: Patient is 70 year old female currently on Eliquis for previous history of bilateral pulmonary emboli who returns to clinic for labs, further evaluation.  She continues Eliquis and denies missing any doses.  Denies abnormal bleeding or bruising. Says she's tolerating it well.  She had a biopsy of thyroid goiter which was benign.  She is followed for this at [facility].  Denies any neurologic complaints. Denies recent fevers or illnesses. Denies any easy bleeding or bruising. No melena or hematochezia. No pica or restless leg. Reports good appetite and denies weight loss. Denies chest pain. Denies any nausea, vomiting, constipation, or diarrhea. Denies urinary complaints. Patient offers no further specific complaints today.  She has retired again.   Past Medical History:  Diagnosis Date   Anxiety    Depression    Diverticulosis of colon    one prior episode July 2011 caused by tomato overingestion   GERD (gastroesophageal reflux disease)    History of colonoscopy    done in  dec 2011, repeat due 5 yrs due to [place]   Hypertension     Past Surgical History:  Procedure Laterality Date   BREAST SURGERY     sterotactic breaswt biopsy: atypical cells,  continued diagnostic  mammograms   COLONOSCOPY     COLONOSCOPY WITH PROPOFOL N/A 02/21/2020   Procedure: COLONOSCOPY WITH PROPOFOL;  Surgeon: Virgel Manifold, MD;  Location: [facility] ENDOSCOPY;  Service: Endoscopy;  Laterality: N/A;   PULMONARY THROMBECTOMY Bilateral 03/17/2020   Procedure: PULMONARY THROMBECTOMY /  THROMBOLYSIS;  Surgeon: Katha Cabal, MD;  Location: [facility];  Service: Cardiovascular;  Laterality: Bilateral;    Family History  Problem Relation Age of Onset   Cancer Mother        bilatera, contralateral recurred after 3 yrs   Stroke Father    Heart disease Father 93       died of stroke  at 17, tobacco user   Cancer Father        colon Ca   Cancer Paternal Grandmother 44       metastatic colon Ca   Cancer Maternal Uncle        breast x 2 aunts    Social History:  reports that she has never smoked. She has never used smokeless tobacco. She reports current alcohol use of about 1.0 standard drink of alcohol per week. She reports that she does not use drugs.  The patient denies any exposure to radiation or toxins.  She retired in 01/2020.  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  The patient lives in [place].  She is alone today.  Allergies:  Allergies  Allergen Reactions   Clindamycin/Lincomycin Rash    Current Medications: Current Outpatient Medications  Medication Sig Dispense Refill   Acetaminophen (TYLENOL ARTHRITIS PAIN PO) Take by mouth.     apixaban (ELIQUIS) 5 MG TABS tablet Take 1 tablet (5 mg total) by mouth 2 (two) times daily. 90 tablet 1   buPROPion (WELLBUTRIN XL) 150 MG 24 hr tablet TAKE 1 TABLET BY MOUTH EVERY DAY 90 tablet 1   Cholecalciferol (VITAMIN D3 PO) Take by mouth every evening.     esomeprazole (NEXIUM) 20 MG capsule Take 20 mg by mouth daily as needed.     fish oil-omega-3 fatty acids 1000 MG capsule Take 2 g by mouth every evening.     losartan (COZAAR) 25 MG tablet Take 1 tablet (25 mg total) by mouth daily. 90 tablet 1   metoprolol succinate (TOPROL-XL) 50 MG 24 hr tablet Take 1 tablet (50 mg total) by mouth daily. 90 tablet 1   Multiple Vitamin (MULTIVITAMIN) tablet Take 1 tablet by mouth every evening.     PREMARIN vaginal cream USE 1  TO 2 GRAMS INTRAVAGINALLY TWICE WEEKLY 30 g 2   ALPRAZolam (XANAX) 0.25 MG tablet Take 1 tablet (0.25 mg total) by mouth 2 (two) times daily as needed for anxiety. (Patient not taking: Reported on 07/07/2020) 20 tablet 0   No current facility-administered medications for this visit.    Review of Systems  Constitutional:  Negative for chills, fever, malaise/fatigue and weight loss.  HENT:  Negative for hearing loss, nosebleeds, sore throat and tinnitus.   Eyes:  Negative for blurred vision and double vision.  Respiratory:  Negative for cough, hemoptysis, shortness of breath and wheezing.   Cardiovascular:  Negative for chest pain, palpitations and leg swelling.  Gastrointestinal:  Negative for abdominal pain, blood in stool, constipation, diarrhea, melena, nausea and vomiting.  Genitourinary:  Negative for dysuria, hematuria and urgency.  Musculoskeletal:  Negative for back pain, falls, joint pain and myalgias.  Skin:  Negative for itching  and rash.  Neurological:  Negative for dizziness, tingling, sensory change, loss of consciousness, weakness and headaches.  Endo/Heme/Allergies:  Negative for environmental allergies. Does not bruise/bleed easily.  Psychiatric/Behavioral:  Negative for depression. The patient is not nervous/anxious and does not have insomnia.   Performance status (ECOG): 0  Vitals Blood pressure 131/78, pulse 82, temperature 97.6 F (36.4 C), temperature source Tympanic, resp. rate 18, weight 218 lb 4.1 oz (99 kg), SpO2 97 %.   General: Well-developed, well-nourished, no acute distress. Eyes: Pink conjunctiva, anicteric sclera. Lungs: Clear to auscultation bilaterally.  No audible wheezing or coughing Heart: Regular rate and rhythm.  Abdomen: Soft, nontender, nondistended.  Musculoskeletal: No edema, cyanosis, or clubbing. Neuro: Alert, answering all questions appropriately. Cranial nerves grossly intact. Skin: No rashes or petechiae noted. Psych: Normal  affect.   Appointment on 07/07/2020  Component Date Value Ref Range Status   Sodium 07/07/2020 139  135 - 145 mmol/L Final   Potassium 07/07/2020 3.5  3.5 - 5.1 mmol/L Final   Chloride 07/07/2020 103  98 - 111 mmol/L Final   CO2 07/07/2020 29  22 - 32 mmol/L Final   Glucose, Bld 07/07/2020 106 (A) 70 - 99 mg/dL Final   Glucose reference range applies only to samples taken after fasting for at least 8 hours.   BUN 07/07/2020 15  8 - 23 mg/dL Final   Creatinine, Ser 07/07/2020 0.96  0.44 - 1.00 mg/dL Final   Calcium 07/07/2020 9.9  8.9 - 10.3 mg/dL Final   Total Protein 07/07/2020 7.4  6.5 - 8.1 g/dL Final   Albumin 07/07/2020 4.2  3.5 - 5.0 g/dL Final   AST 07/07/2020 26  15 - 41 U/L Final   ALT 07/07/2020 22  0 - 44 U/L Final   Alkaline Phosphatase 07/07/2020 58  38 - 126 U/L Final   Total Bilirubin 07/07/2020 0.4  0.3 - 1.2 mg/dL Final   GFR, Estimated 07/07/2020 >60  >60 mL/min Final   Comment: (NOTE) Calculated using the CKD-EPI Creatinine Equation (2021)    Anion gap 07/07/2020 7  5 - 15 Final   Performed at [facility], [address], [place], [place] [address]   WBC 07/07/2020 8.0  4.0 - 10.5 K/uL Final   RBC 07/07/2020 4.34  3.87 - 5.11 MIL/uL Final   Hemoglobin 07/07/2020 13.5  12.0 - 15.0 g/dL Final   HCT 07/07/2020 39.1  36.0 - 46.0 % Final   MCV 07/07/2020 90.1  80.0 - 100.0 fL Final   MCH 07/07/2020 31.1  26.0 - 34.0 pg Final   MCHC 07/07/2020 34.5  30.0 - 36.0 g/dL Final   RDW 07/07/2020 13.4  11.5 - 15.5 % Final   Platelets 07/07/2020 216  150 - 400 K/uL Final   nRBC 07/07/2020 0.0  0.0 - 0.2 % Final   Neutrophils Relative % 07/07/2020 51  % Final   Neutro Abs 07/07/2020 4.1  1.7 - 7.7 K/uL Final   Lymphocytes Relative 07/07/2020 38  % Final   Lymphs Abs 07/07/2020 3.0  0.7 - 4.0 K/uL Final   Monocytes Relative 07/07/2020 8  % Final   Monocytes Absolute 07/07/2020 0.6  0.1 - 1.0 K/uL Final   Eosinophils Relative 07/07/2020 3  % Final    Eosinophils Absolute 07/07/2020 0.2  0.0 - 0.5 K/uL Final   Basophils Relative 07/07/2020 0  % Final   Basophils Absolute 07/07/2020 0.0  0.0 - 0.1 K/uL Final   Immature Granulocytes 07/07/2020 0  %  Final   Abs Immature Granulocytes 07/07/2020 0.02  0.00 - 0.07 K/uL Final   Performed at [facility], [address] [place], [place] [address]    Assessment:    1.   Bilateral pulmonary emboli-negative hypercoagulable work-up.  No current evidence of malignancy by her report screenings are up-to-date.  She is followed by her PCP, Dr. Derrel Nip. S/p thrombectomy. Currently on eliquis. Tolerating well without side effects.  Given the life-threatening pulmonary embolism, agree with recommendation for lifelong anticoagulation.  2.   Normocytic anemia- resolved  3.    Increased LFTs- resolved  Plan:  3 mo- rtc for re-evaluation/fu with NP/MD   I discussed the assessment and treatment plan with the patient.  The patient was provided an opportunity to ask questions and all were answered.  The patient agreed with the plan and demonstrated an understanding of the instructions.  The patient was advised to call back if the symptoms worsen or if the condition fails to improve as anticipated.  Beckey Rutter, DNP, AGNP-C [place] at [facility] [phone] (clinic)  CC: Dr. Derrel Nip

## 2020-07-16 ENCOUNTER — Ambulatory Visit: Payer: Medicare PPO | Admitting: Internal Medicine

## 2020-07-20 DIAGNOSIS — I2699 Other pulmonary embolism without acute cor pulmonale: Secondary | ICD-10-CM | POA: Diagnosis not present

## 2020-07-20 DIAGNOSIS — K6289 Other specified diseases of anus and rectum: Secondary | ICD-10-CM | POA: Diagnosis not present

## 2020-07-22 DIAGNOSIS — E049 Nontoxic goiter, unspecified: Secondary | ICD-10-CM | POA: Diagnosis not present

## 2020-07-23 ENCOUNTER — Other Ambulatory Visit: Payer: Self-pay | Admitting: Internal Medicine

## 2020-08-05 DIAGNOSIS — M170 Bilateral primary osteoarthritis of knee: Secondary | ICD-10-CM | POA: Diagnosis not present

## 2020-08-07 ENCOUNTER — Ambulatory Visit: Payer: Medicare PPO

## 2020-08-14 ENCOUNTER — Ambulatory Visit: Payer: Medicare PPO | Admitting: Internal Medicine

## 2020-08-14 ENCOUNTER — Other Ambulatory Visit: Payer: Self-pay

## 2020-08-14 ENCOUNTER — Encounter: Payer: Self-pay | Admitting: Internal Medicine

## 2020-08-14 VITALS — BP 132/78 | HR 78 | Temp 96.5°F | Resp 15 | Ht 63.0 in | Wt 211.8 lb

## 2020-08-14 DIAGNOSIS — I10 Essential (primary) hypertension: Secondary | ICD-10-CM

## 2020-08-14 DIAGNOSIS — E7800 Pure hypercholesterolemia, unspecified: Secondary | ICD-10-CM | POA: Diagnosis not present

## 2020-08-14 DIAGNOSIS — Z6841 Body Mass Index (BMI) 40.0 and over, adult: Secondary | ICD-10-CM | POA: Diagnosis not present

## 2020-08-14 DIAGNOSIS — K760 Fatty (change of) liver, not elsewhere classified: Secondary | ICD-10-CM | POA: Diagnosis not present

## 2020-08-14 DIAGNOSIS — Z9229 Personal history of other drug therapy: Secondary | ICD-10-CM

## 2020-08-14 DIAGNOSIS — F324 Major depressive disorder, single episode, in partial remission: Secondary | ICD-10-CM | POA: Diagnosis not present

## 2020-08-14 DIAGNOSIS — E049 Nontoxic goiter, unspecified: Secondary | ICD-10-CM

## 2020-08-14 DIAGNOSIS — I2699 Other pulmonary embolism without acute cor pulmonale: Secondary | ICD-10-CM | POA: Diagnosis not present

## 2020-08-14 MED ORDER — ATORVASTATIN CALCIUM 20 MG PO TABS: 20.0000 mg | ORAL_TABLET | Freq: Every day | ORAL | 3 refills | Status: DC

## 2020-08-14 NOTE — Patient Instructions (Addendum)
Resume generic Lipitor  Return for labs in 3 weeks ,  no fasting required   Schedule your CPE

## 2020-08-14 NOTE — Progress Notes (Signed)
Subjective:  Patient ID: Michele Garza, female    DOB: 1950-03-09  Age: 70 y.o. MRN: [id number]  CC: The primary encounter diagnosis was Fatty liver. Diagnoses of COVID-19 vaccine series completed, Primary hypertension, Morbid obesity with BMI of 40.0-44.9, adult ([place]), Retrosternal goiter, Major depressive disorder with single episode, in partial remission ([place]), Pulmonary embolism, unspecified chronicity, unspecified pulmonary embolism type, unspecified whether acute cor pulmonale present ([place]), and Pure hypercholesterolemia were also pertinent to this visit.  HPI Michele Garza presents for follow up on multiple issues: ( This visit occurred during the SARS-CoV-2 public health emergency.  Safety protocols were in place, including screening questions prior to the visit, additional usage of staff PPE, and extensive cleaning of exam room while observing appropriate contact time as indicated for disinfecting solutions.    Anxiety:  due to heatlh issues and home expenses (A/C, water heater both need replacing).  Right after retiring.  Had to borrow from her inheritance. Worried about her 60 yr old dog. Mother is 54 and now in assisted living and having respiratory issues .  Depression negative except for regretting not having had a more lucrative career .  Happy that son is remarrying.  1) Bilateral pulmonary emboli   submassive  per oncology had lifelong anticoagulation ,  negative hypercoag workup.  Tolerating eliquis  2) benign substernal goiter,  had a negative FNA April 2022 by [facility]  observation follow up 6 months   3) hypertension :  has resumed metoprolol and losartan . Home readings have been 130/80   4) Obesity ; has lost 7 lbs since march  "doing sensible dietary changes"  limited by OA knees seeing K[facility] had steroid injections in both knees walking better   5) Aortic atherosclerosis:  taking her statin    Outpatient Medications Prior to Visit  Medication Sig Dispense  Refill   Acetaminophen (TYLENOL ARTHRITIS PAIN PO) Take by mouth.     buPROPion (WELLBUTRIN XL) 150 MG 24 hr tablet TAKE 1 TABLET BY MOUTH EVERY DAY 90 tablet 1   Cholecalciferol (VITAMIN D3 PO) Take by mouth every evening.     ELIQUIS 5 MG TABS tablet TAKE 1 TABLET BY MOUTH TWICE A DAY 60 tablet 1   esomeprazole (NEXIUM) 20 MG capsule Take 20 mg by mouth daily as needed.     fish oil-omega-3 fatty acids 1000 MG capsule Take 2 g by mouth every evening.     losartan (COZAAR) 25 MG tablet Take 1 tablet (25 mg total) by mouth daily. 90 tablet 1   metoprolol succinate (TOPROL-XL) 50 MG 24 hr tablet Take 1 tablet (50 mg total) by mouth daily. 90 tablet 1   Multiple Vitamin (MULTIVITAMIN) tablet Take 1 tablet by mouth every evening.     PREMARIN vaginal cream USE 1 TO 2 GRAMS INTRAVAGINALLY TWICE WEEKLY 30 g 2   ALPRAZolam (XANAX) 0.25 MG tablet Take 1 tablet (0.25 mg total) by mouth 2 (two) times daily as needed for anxiety. (Patient not taking: Reported on 07/07/2020) 20 tablet 0   No facility-administered medications prior to visit.    Review of Systems;  Patient denies headache, fevers, malaise, unintentional weight loss, skin rash, eye pain, sinus congestion and sinus pain, sore throat, dysphagia,  hemoptysis , cough, dyspnea, wheezing, chest pain, palpitations, orthopnea, edema, abdominal pain, nausea, melena, diarrhea, constipation, flank pain, dysuria, hematuria, urinary  Frequency, nocturia, numbness, tingling, seizures,  Focal weakness, Loss of consciousness,  Tremor, insomnia, depression, anxiety, and suicidal ideation.  Objective:  BP 132/78 (BP Location: Left Arm, Patient Position: Sitting, Cuff Size: Large)   Pulse 78   Temp (!) 96.5 F (35.8 C) (Temporal)   Resp 15   Ht '5\' 3"'$  (1.6 m)   Wt 211 lb 12.8 oz (96.1 kg)   SpO2 94%   BMI 37.52 kg/m   BP Readings from Last 3 Encounters:  08/14/20 132/78  07/07/20 131/78  04/15/20 138/90    Wt Readings from Last 3  Encounters:  08/14/20 211 lb 12.8 oz (96.1 kg)  07/07/20 218 lb 4.1 oz (99 kg)  04/15/20 219 lb 9.6 oz (99.6 kg)    General appearance: alert, cooperative and appears stated age Ears: normal TM's and external ear canals both ears Throat: lips, mucosa, and tongue normal; teeth and gums normal Neck: no adenopathy, no carotid bruit, supple, symmetrical, trachea midline and thyroid not enlarged, symmetric, no tenderness/mass/nodules Back: symmetric, no curvature. ROM normal. No CVA tenderness. Lungs: clear to auscultation bilaterally Heart: regular rate and rhythm, S1, S2 normal, no murmur, click, rub or gallop Abdomen: soft, non-tender; bowel sounds normal; no masses,  no organomegaly Pulses: 2+ and symmetric Skin: Skin color, texture, turgor normal. No rashes or lesions Lymph nodes: Cervical, supraclavicular, and axillary nodes normal.  Lab Results  Component Value Date   HGBA1C 5.7 10/17/2019   HGBA1C 5.7 04/17/2019   HGBA1C 5.6 09/07/2018    Lab Results  Component Value Date   CREATININE 0.96 07/07/2020   CREATININE 1.01 03/27/2020   CREATININE 0.84 03/20/2020    Lab Results  Component Value Date   WBC 8.0 07/07/2020   HGB 13.5 07/07/2020   HCT 39.1 07/07/2020   PLT 216 07/07/2020   GLUCOSE 106 (H) 07/07/2020   CHOL 170 10/17/2019   TRIG 67.0 10/17/2019   HDL 57.90 10/17/2019   LDLDIRECT 108.0 11/11/2016   LDLCALC 99 10/17/2019   ALT 22 07/07/2020   AST 26 07/07/2020   NA 139 07/07/2020   K 3.5 07/07/2020   CL 103 07/07/2020   CREATININE 0.96 07/07/2020   BUN 15 07/07/2020   CO2 29 07/07/2020   TSH 0.555 03/16/2020   INR 1.1 03/16/2020   HGBA1C 5.7 10/17/2019   MICROALBUR <0.2 12/08/2017    CT Angio Chest PE W/Cm &/Or Wo Cm  Result Date: 03/16/2020 CLINICAL DATA:  70 year old female with syncopal episode while using the bathroom. EXAM: CT ANGIOGRAPHY CHEST WITH CONTRAST TECHNIQUE: Multidetector CT imaging of the chest was performed using the standard  protocol during bolus administration of intravenous contrast. Multiplanar CT image reconstructions and MIPs were obtained to evaluate the vascular anatomy. CONTRAST:  35m OMNIPAQUE IOHEXOL 350 MG/ML SOLN COMPARISON:  None. FINDINGS: Cardiovascular: Excellent contrast bolus timing in the pulmonary arterial tree. Positive bilateral distal main pulmonary artery and hilar pulmonary embolus (series 5, images 95, 109. Clot extends into all lobar branches. There is no saddle embolus. Associated abnormal RV/LV ratio = greater than 1.5. No pericardial effusion. Little contrast in the aorta. Calcified coronary artery atherosclerosis. Mediastinum/Nodes: Thyroid goiter with retrosternal extension of partially calcified bulky left lobe. No mediastinal lymphadenopathy. Lungs/Pleura: Trace pleural fluid. Dependent ground-glass opacity in both lungs but no confluent pulmonary infarct or consolidation. Moderate narrowing of the trachea at the thoracic inlet related to bulky left thyroid goiter (series 6, image 12). Otherwise the major airways are patent. Upper Abdomen: Suggestion of nonspecific inflammation at the porta hepatis in the visible upper abdomen. Small volume perihepatic ascites with simple fluid density. The spleen is not enlarged.  Gallbladder might be indistinct, uncertain. Visible pancreas, stomach and kidneys remain within normal limits. There is large bowel diverticulosis at the splenic flexure. Musculoskeletal: Mild probably chronic T4, T5 and T7 mild superior endplate compression. Degenerative appearing Schmorl's nodes occasionally in the lower thoracic spine. No suspicious osseous lesion. Review of the MIP images confirms the above findings. IMPRESSION: 1. Positive for acute pulmonary embolus with clot in both main PAs and lobar arteries. CT evidence of right heart strain (abnormal RV/LV Ratio suspicious for at least submassive (intermediate risk) PE. The presence of right heart strain has been associated with an  increased risk of morbidity and mortality. 2. Trace pleural fluid and mild ground-glass opacity. No discrete pulmonary infarct at this time. 3. Evidence also of nonspecific inflammation at the [facility] with small volume Perihepatic Ascites. Questionable inflammation of the gallbladder. Follow-up CT Abdomen and Pelvis (IV contrast preferred) recommended when feasible. 4. Bulky thyroid goiter with retrosternal extension and moderate compression of the trachea at the thoracic inlet. 5. Calcified coronary artery atherosclerosis. Aortic Atherosclerosis (ICD10-I70.0). Critical Value/emergent results were called by telephone at the time of interpretation on 03/16/2020 at 4:41 am to Dr. Hinda Kehr , who verbally acknowledged these results. Electronically Signed   By: Genevie Ann M.D.   On: 03/16/2020 04:45   PERIPHERAL VASCULAR CATHETERIZATION  Result Date: 03/17/2020 See op note  US Venous Img Lower Bilateral (DVT)  Result Date: 03/16/2020 CLINICAL DATA:  History of pulmonary embolism.  Evaluate for DVT. EXAM: BILATERAL LOWER EXTREMITY VENOUS DOPPLER ULTRASOUND TECHNIQUE: Gray-scale sonography with graded compression, as well as color Doppler and duplex ultrasound were performed to evaluate the lower extremity deep venous systems from the level of the common femoral vein and including the common femoral, femoral, profunda femoral, popliteal and calf veins including the posterior tibial, peroneal and gastrocnemius veins when visible. The superficial great saphenous vein was also interrogated. Spectral Doppler was utilized to evaluate flow at rest and with distal augmentation maneuvers in the common femoral, femoral and popliteal veins. COMPARISON:  Chest CTA-earlier same day FINDINGS: RIGHT LOWER EXTREMITY Common Femoral Vein: No evidence of thrombus. Normal compressibility, respiratory phasicity and response to augmentation. Saphenofemoral Junction: No evidence of thrombus. Normal compressibility and flow on color  Doppler imaging. Profunda Femoral Vein: No evidence of thrombus. Normal compressibility and flow on color Doppler imaging. Femoral Vein: No evidence of thrombus. Normal compressibility, respiratory phasicity and response to augmentation. Popliteal Vein: No evidence of thrombus. Normal compressibility, respiratory phasicity and response to augmentation. Calf Veins: No evidence of thrombus. Normal compressibility and flow on color Doppler imaging. Superficial Great Saphenous Vein: No evidence of thrombus. Normal compressibility. Venous Reflux:  None. Other Findings:  None. LEFT LOWER EXTREMITY Common Femoral Vein: No evidence of thrombus. Normal compressibility, respiratory phasicity and response to augmentation. Saphenofemoral Junction: No evidence of thrombus. Normal compressibility and flow on color Doppler imaging. Profunda Femoral Vein: No evidence of thrombus. Normal compressibility and flow on color Doppler imaging. Femoral Vein: No evidence of thrombus. Normal compressibility, respiratory phasicity and response to augmentation. Popliteal Vein: No evidence of thrombus. Normal compressibility, respiratory phasicity and response to augmentation. Calf Veins: Appear patent where visualized. Superficial Great Saphenous Vein: No evidence of thrombus. Normal compressibility. Venous Reflux:  None. Other Findings:  None. IMPRESSION: No evidence of DVT within either lower extremity. Electronically Signed   By: Sandi Mariscal M.D.   On: 03/16/2020 09:37   ECHOCARDIOGRAM COMPLETE  Result Date: 03/16/2020    ECHOCARDIOGRAM REPORT   Patient Name:  Cora Collum Date of Exam: 03/16/2020 Medical Rec #:  [id number]      Height:       63.0 in Accession #:    [id number]     Weight:       239.6 lb Date of Birth:  1950/06/12      BSA:          2.088 m Patient Age:    50 years       BP:           92/66 mmHg Patient Gender: F              HR:           109 bpm. Exam Location:  [facility] Procedure: 2D Echo, Color Doppler, Cardiac Doppler  and Intracardiac            Opacification Agent Indications:     I26.09 Pulmonary Embolus  History:         Patient has no prior history of Echocardiogram examinations.                  Risk Factors:Hypertension.  Sonographer:     Charmayne Sheer RDCS (AE) Referring Phys:  [id number] [place] L [place] Diagnosing Phys: Kathlyn Sacramento MD  Sonographer Comments: Technically difficult study due to poor echo windows. TDS due to pt tachycardia throughout study. IMPRESSIONS  1. Left ventricular ejection fraction, by estimation, is 55 to 60%. The left ventricle has normal function. The left ventricle has no regional wall motion abnormalities. Left ventricular diastolic parameters are consistent with Grade I diastolic dysfunction (impaired relaxation).  2. Right ventricular systolic function is mildly reduced. The right ventricular size is moderately enlarged. There is moderately elevated pulmonary artery systolic pressure. The estimated right ventricular systolic pressure is 99991111 mmHg.  3. The mitral valve is normal in structure. No evidence of mitral valve regurgitation. No evidence of mitral stenosis.  4. The aortic valve is normal in structure. Aortic valve regurgitation is not visualized. No aortic stenosis is present.  5. The inferior vena cava is dilated in size with <50% respiratory variability, suggesting right atrial pressure of 15 mmHg. FINDINGS  Left Ventricle: Left ventricular ejection fraction, by estimation, is 55 to 60%. The left ventricle has normal function. The left ventricle has no regional wall motion abnormalities. Definity contrast agent was given IV to delineate the left ventricular  endocardial borders. The left ventricular internal cavity size was normal in size. There is no left ventricular hypertrophy. Left ventricular diastolic parameters are consistent with Grade I diastolic dysfunction (impaired relaxation). Right Ventricle: The right ventricular size is moderately enlarged. No increase in right  ventricular wall thickness. Right ventricular systolic function is mildly reduced. There is moderately elevated pulmonary artery systolic pressure. The tricuspid regurgitant velocity is 3.00 m/s, and with an assumed right atrial pressure of 15 mmHg, the estimated right ventricular systolic pressure is 99991111 mmHg. Left Atrium: Left atrial size was normal in size. Right Atrium: Right atrial size was normal in size. Pericardium: There is no evidence of pericardial effusion. Mitral Valve: The mitral valve is normal in structure. No evidence of mitral valve regurgitation. No evidence of mitral valve stenosis. MV peak gradient, 4.2 mmHg. The mean mitral valve gradient is 1.0 mmHg. Tricuspid Valve: The tricuspid valve is normal in structure. Tricuspid valve regurgitation is mild . No evidence of tricuspid stenosis. Aortic Valve: The aortic valve is normal in structure. Aortic valve regurgitation is not visualized. No aortic stenosis is present. Aortic valve  mean gradient measures 2.0 mmHg. Aortic valve peak gradient measures 4.6 mmHg. Aortic valve area, by VTI measures 2.25 cm. Pulmonic Valve: The pulmonic valve was normal in structure. Pulmonic valve regurgitation is not visualized. No evidence of pulmonic stenosis. Aorta: The aortic root is normal in size and structure. Venous: The inferior vena cava is dilated in size with less than 50% respiratory variability, suggesting right atrial pressure of 15 mmHg. IAS/Shunts: No atrial level shunt detected by color flow Doppler.  LEFT VENTRICLE PLAX 2D LVIDd:         3.20 cm  Diastology LVIDs:         2.10 cm  LV e' medial:    4.46 cm/s LV PW:         1.20 cm  LV E/e' medial:  9.7 LV IVS:        0.80 cm  LV e' lateral:   7.51 cm/s LVOT diam:     2.00 cm  LV E/e' lateral: 5.8 LV SV:         31 LV SV Index:   15 LVOT Area:     3.14 cm  LEFT ATRIUM         Index LA diam:    2.60 cm 1.25 cm/m  AORTIC VALVE                   PULMONIC VALVE AV Area (Vmax):    2.05 cm    PV Vmax:        0.58 m/s AV Area (Vmean):   2.06 cm    PV Vmean:      43.600 cm/s AV Area (VTI):     2.25 cm    PV VTI:        0.104 m AV Vmax:           107.00 cm/s PV Peak grad:  1.4 mmHg AV Vmean:          68.200 cm/s PV Mean grad:  1.0 mmHg AV VTI:            0.137 m AV Peak Grad:      4.6 mmHg AV Mean Grad:      2.0 mmHg LVOT Vmax:         69.80 cm/s LVOT Vmean:        44.800 cm/s LVOT VTI:          0.098 m LVOT/AV VTI ratio: 0.72  AORTA Ao Root diam: 3.60 cm MITRAL VALVE               TRICUSPID VALVE MV Area (PHT): 6.07 cm    TR Peak grad:   36.0 mmHg MV Area VTI:   2.61 cm    TR Vmax:        300.00 cm/s MV Peak grad:  4.2 mmHg MV Mean grad:  1.0 mmHg    SHUNTS MV Vmax:       1.03 m/s    Systemic VTI:  0.10 m MV Vmean:      53.3 cm/s   Systemic Diam: 2.00 cm MV Decel Time: 125 msec MV E velocity: 43.30 cm/s MV A velocity: 76.30 cm/s MV E/A ratio:  0.57 Kathlyn Sacramento MD Electronically signed by Kathlyn Sacramento MD Signature Date/Time: 03/16/2020/2:02:53 PM    Final    US THYROID  Result Date: 03/16/2020 CLINICAL DATA:  Incidental on CT. Goiter incidentally noted on chest CT EXAM: THYROID ULTRASOUND TECHNIQUE: Ultrasound examination of the thyroid gland and adjacent soft tissues was  performed. COMPARISON:  Chest CT-earlier same day FINDINGS: Parenchymal Echotexture: Mildly heterogenous Isthmus: Borderline enlarged measures 0.8 cm in diameter Right lobe: Slightly atrophic measuring 4.3 x 1.4 x 1.5 cm Left lobe: Enlarged measuring 7.3 x 3.9 x 2.8 cm _________________________________________________________ Estimated total number of nodules >/= 1 cm: 1 Number of spongiform nodules >/=  2 cm not described below (TR1): 0 Number of mixed cystic and solid nodules >/= 1.5 cm not described below (TR2): 0 _________________________________________________________ Nodule # 1: Location: Left; Inferior - this nodule correlates with the nodule seen on preceding chest CT Maximum size: 4.1 cm; Other 2 dimensions: 3.4 x 3.1 cm (nodule  estimated to measure at least 4.1 cm as there is significant substernal extension demonstrated on preceding chest CT) Composition: solid/almost completely solid (2) Echogenicity: isoechoic (1) Shape: taller-than-wide (3) Margins: ill-defined (0) Echogenic foci: none (0) ACR TI-RADS total points: 6. ACR TI-RADS risk category: TR4 (4-6 points). ACR TI-RADS recommendations: **Given size (>/= 1.5 cm) and appearance, fine needle aspiration of this moderately suspicious nodule should be considered based on TI-RADS criteria. _________________________________________________________ IMPRESSION: Left-sided thyroid nodule/mass correlating with the nodule seen on preceding chest CT meets imaging criteria to recommend percutaneous sampling as indicated. The above is in keeping with the ACR TI-RADS recommendations - J Am Coll Radiol 2017;14:587-595. Electronically Signed   By: Sandi Mariscal M.D.   On: 03/16/2020 09:42    Assessment & Plan:   Problem List Items Addressed This Visit       Unprioritized   Hypertension    Well controlled on current regimen. Renal function stable, no changes today.  Lab Results  Component Value Date   CREATININE 0.96 07/07/2020         Relevant Medications   atorvastatin (LIPITOR) 20 MG tablet   Major depressive disorder in partial remission ([place])    Her symptoms are well controlled,    No changes to medication today        Morbid obesity with BMI of 40.0-44.9, adult ([place])    I have congratulated her in her weight loss of 41 lbs since [place] 2022 and encouraged  Continued weight loss with goal of 10% of body weight over the next 6 months using a low glycemic index diet and regular exercise a minimum of 5 days per week.         Hyperlipidemia    She is tolerating daily dose of lipitor 20 mg without side effects.   LDL is at goal.  Lab Results  Component Value Date   CHOL 170 10/17/2019   HDL 57.90 10/17/2019   LDLCALC 99 10/17/2019   LDLDIRECT 108.0 11/11/2016    TRIG 67.0 10/17/2019   CHOLHDL 3 10/17/2019   Lab Results  Component Value Date   ALT 22 07/07/2020   AST 26 07/07/2020   ALKPHOS 58 07/07/2020   BILITOT 0.4 07/07/2020          Relevant Medications   atorvastatin (LIPITOR) 20 MG tablet   Pulmonary emboli (H[facility])    Bilateral , involving both main pulmonary arteries and with right heart strain, s/p thrombectomy/thrombolysis on March 1 .  She will continue Eliquis lifelong  for 6 months as her full hypercoag workup by Hematology was negative and the PE was massive is considered provoked due to her  relative immobilization due to achilles tendon injury.       Relevant Medications   atorvastatin (LIPITOR) 20 MG tablet   Retrosternal goiter    No surgery planned per [facility]        Other Visit Diagnoses     Fatty liver    -  Primary   Relevant Orders   Hepatic function panel   COVID-19 vaccine series completed       Relevant Orders   SARS-CoV-2 Semi-Quantitative Total Antibody, Spike      I spent 30 mintutes dedicated to the care of this patient on the date of this encounter to include pre-visit review of her medical history and evaluation by ENT and  her hematologist, counselling  about her PE<  goiter, anxiety,  hyperlipidemia, obesity  , and post visit ordering of testing and therapeutics.  Meds ordered this encounter  Medications   atorvastatin (LIPITOR) 20 MG tablet    Sig: Take 1 tablet (20 mg total) by mouth daily.    Dispense:  90 tablet    Refill:  3    Medications Discontinued During This Encounter  Medication Reason   ALPRAZolam (XANAX) 0.25 MG tablet     Follow-up: Return in about 3 weeks (around 09/04/2020).   Crecencio Mc, MD

## 2020-08-16 NOTE — Assessment & Plan Note (Signed)
Well controlled on current regimen. Renal function stable, no changes today.  Lab Results  Component Value Date   CREATININE 0.96 07/07/2020

## 2020-08-16 NOTE — Assessment & Plan Note (Signed)
No surgery planned per [facility] ENT

## 2020-08-16 NOTE — Assessment & Plan Note (Addendum)
I have congratulated her in her weight loss of 41 lbs since [place] 2022 and encouraged  Continued weight loss with goal of 10% of body weight over the next 6 months using a low glycemic index diet and regular exercise a minimum of 5 days per week.

## 2020-08-16 NOTE — Assessment & Plan Note (Signed)
Her symptoms are well controlled,    No changes to medication today

## 2020-08-16 NOTE — Assessment & Plan Note (Addendum)
Bilateral , involving both main pulmonary arteries and with right heart strain, s/p thrombectomy/thrombolysis on March 1 .  She will continue Eliquis lifelong  for 6 months as her full hypercoag workup by Hematology was negative and the PE was massive is considered provoked due to her  relative immobilization due to achilles tendon injury.

## 2020-08-16 NOTE — Assessment & Plan Note (Signed)
She is tolerating daily dose of lipitor 20 mg without side effects.   LDL is at goal.  Lab Results  Component Value Date   CHOL 170 10/17/2019   HDL 57.90 10/17/2019   LDLCALC 99 10/17/2019   LDLDIRECT 108.0 11/11/2016   TRIG 67.0 10/17/2019   CHOLHDL 3 10/17/2019   Lab Results  Component Value Date   ALT 22 07/07/2020   AST 26 07/07/2020   ALKPHOS 58 07/07/2020   BILITOT 0.4 07/07/2020

## 2020-08-20 DIAGNOSIS — I2699 Other pulmonary embolism without acute cor pulmonale: Secondary | ICD-10-CM | POA: Diagnosis not present

## 2020-08-20 DIAGNOSIS — K6289 Other specified diseases of anus and rectum: Secondary | ICD-10-CM | POA: Diagnosis not present

## 2020-09-04 ENCOUNTER — Other Ambulatory Visit: Payer: Self-pay

## 2020-09-04 ENCOUNTER — Other Ambulatory Visit (INDEPENDENT_AMBULATORY_CARE_PROVIDER_SITE_OTHER): Payer: Medicare PPO

## 2020-09-04 DIAGNOSIS — Z9229 Personal history of other drug therapy: Secondary | ICD-10-CM | POA: Diagnosis not present

## 2020-09-04 DIAGNOSIS — K760 Fatty (change of) liver, not elsewhere classified: Secondary | ICD-10-CM | POA: Diagnosis not present

## 2020-09-04 LAB — HEPATIC FUNCTION PANEL
ALT: 20 U/L (ref 0–35)
AST: 19 U/L (ref 0–37)
Albumin: 4.1 g/dL (ref 3.5–5.2)
Alkaline Phosphatase: 53 U/L (ref 39–117)
Bilirubin, Direct: 0.2 mg/dL (ref 0.0–0.3)
Total Bilirubin: 0.9 mg/dL (ref 0.2–1.2)
Total Protein: 6.6 g/dL (ref 6.0–8.3)

## 2020-09-08 LAB — SARS-COV-2 SEMI-QUANTITATIVE TOTAL ANTIBODY, SPIKE: SARS COV2 AB, Total Spike Semi QN: 1111 U/mL — ABNORMAL HIGH (ref <0.8)

## 2020-09-20 DIAGNOSIS — I2699 Other pulmonary embolism without acute cor pulmonale: Secondary | ICD-10-CM | POA: Diagnosis not present

## 2020-09-20 DIAGNOSIS — K6289 Other specified diseases of anus and rectum: Secondary | ICD-10-CM | POA: Diagnosis not present

## 2020-09-25 ENCOUNTER — Other Ambulatory Visit: Payer: Self-pay | Admitting: Internal Medicine

## 2020-09-30 ENCOUNTER — Ambulatory Visit (INDEPENDENT_AMBULATORY_CARE_PROVIDER_SITE_OTHER): Payer: Medicare PPO

## 2020-09-30 VITALS — HR 72 | Ht 63.0 in | Wt 211.0 lb

## 2020-09-30 DIAGNOSIS — Z0000 Encounter for general adult medical examination without abnormal findings: Secondary | ICD-10-CM | POA: Diagnosis not present

## 2020-09-30 NOTE — Progress Notes (Addendum)
Subjective:   Michele Garza is a 70 y.o. female who presents for Medicare Annual (Subsequent) preventive examination.  Review of Systems    No ROS.  Medicare Wellness Virtual Visit.  Visual/audio telehealth visit, UTA vital signs.   See social history for additional risk factors.   Cardiac Risk Factors include: advanced age (>48mn, >>65women);hypertension     Objective:    Today's Vitals   09/30/20 1119  Pulse: 72  SpO2: 97%  Weight: 211 lb (95.7 kg)  Height: '5\' 3"'$  (1.6 m)   Body mass index is 37.38 kg/m.  Advanced Directives 09/30/2020 07/07/2020 03/23/2020 03/16/2020 02/21/2020 08/07/2019 08/06/2018  Does Patient Have a Medical Advance Directive? Yes No No No Yes No No  Type of AParamedicof ABurnsLiving will - - - Living will - -  Does patient want to make changes to medical advance directive? No - Patient declined - - - - - No - Patient declined  Copy of HBrowardin Chart? No - copy requested - - - - - -  Would patient like information on creating a medical advance directive? - No - Patient declined - No - Patient declined - No - Patient declined -    Current Medications (verified) Outpatient Encounter Medications as of 09/30/2020  Medication Sig   Acetaminophen (TYLENOL ARTHRITIS PAIN PO) Take by mouth.   atorvastatin (LIPITOR) 20 MG tablet Take 1 tablet (20 mg total) by mouth daily.   buPROPion (WELLBUTRIN XL) 150 MG 24 hr tablet TAKE 1 TABLET BY MOUTH EVERY DAY   Cholecalciferol (VITAMIN D3 PO) Take by mouth every evening.   ELIQUIS 5 MG TABS tablet TAKE 1 TABLET BY MOUTH TWICE A DAY   esomeprazole (NEXIUM) 20 MG capsule Take 20 mg by mouth daily as needed.   fish oil-omega-3 fatty acids 1000 MG capsule Take 2 g by mouth every evening.   losartan (COZAAR) 25 MG tablet TAKE 1 TABLET (25 MG TOTAL) BY MOUTH DAILY.   metoprolol succinate (TOPROL-XL) 50 MG 24 hr tablet Take 1 tablet (50 mg total) by mouth daily.   Multiple  Vitamin (MULTIVITAMIN) tablet Take 1 tablet by mouth every evening.   PREMARIN vaginal cream USE 1 TO 2 GRAMS INTRAVAGINALLY TWICE WEEKLY   No facility-administered encounter medications on file as of 09/30/2020.    Allergies (verified) Clindamycin/lincomycin   History: Past Medical History:  Diagnosis Date   Anxiety    Depression    Diverticulosis of colon    one prior episode July 2011 caused by tomato overingestion   GERD (gastroesophageal reflux disease)    History of colonoscopy    done in  dec 2011, repeat due 5 yrs [place]  Hypert[place]    Past Surgical History:  Procedure Laterality Date   BREAST SURGERY     sterotactic breaswt biopsy: atypical cells,  continued diagnostic mammograms   COLONOSCOPY     COLONOSCOPY WITH PROPOFOL N/A 02/21/2020   Procedure: COLONOSCOPY WITH PROPOFOL;  Surgeon: TVirgel Manifold MD;  Location: [facility] ENDOSCOPY;  Service: Endoscopy;  Laterality: N/A;   PULMONARY THROMBECTOMY Bilateral 03/17/2020   Procedure: PULMONARY THROMBECTOMY / THROMBOLYSIS;  Surgeon: SKatha Cabal MD;  [facility];  Service: Cardiovascular;  Laterality: Bilateral;   Family History  Problem Relation Age of Onset   Cancer Mother        bilatera, contralateral recurred after 343yrs   Stroke Father    Heart disease Father 485  died of stroke  at 87, tobacco user   Cancer Father        colon Ca   Cancer Paternal Grandmother 44       metastatic colon Ca   Cancer Maternal Uncle        breast x 2 aunts   Social History   Socioeconomic History   Marital status: Divorced    Spouse name: Not on file   Number of children: Not on file   Years of education: Not on file   Highest education level: Not on file  Occupational History   Occupation: Glass blower/designer    Employer: 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[place])  Tobacco Use   Smoking status: Never   Smokeless tobacco: Never  Vaping Use   Vaping Use: Never  used  Substance and Sexual Activity   Alcohol use: Yes    Alcohol/week: 1.0 standard drink    Types: 1 Shots of liquor per week    Comment: occasional   Drug use: No   Sexual activity: Not on file  Other Topics Concern   Not on file  Social History Narrative   Not on file   Social Determinants of Health   Financial Resource Strain: Low Risk    Difficulty of Paying Living Expenses: Not hard at all  Food Insecurity: No Food Insecurity   Worried About Charity fundraiser in the Last Year: Never true   [place] in the Last Year: Never true  Transportation Needs: No Transportation Needs   Lack of Transportation (Medical): No   Lack of Transportation (Non-Medical): No  Physical Activity: Not on file  Stress: No Stress Concern Present   Feeling of Stress : Not at all  Social Connections: Unknown   Frequency of Communication with Friends and Family: Not on file   Frequency of Social Gatherings with Friends and Family: Not on file   Attends Religious Services: More than 4 times per year   Active Member of [employer] or Organizations: Yes   Attends Archivist Meetings: Not on file   Marital Status: Not on file    Tobacco Counseling Counseling given: Not Answered   Clinical Intake:  Pre-visit preparation completed: Yes        Diabetes: No  How often do you need to have someone help you when you read instructions, pamphlets, or other written materials from your doctor or pharmacy?: 1 - Never    Interpreter Needed?: No      Activities of Daily Living In your present state of health, do you have any difficulty performing the following activities: 09/30/2020  Hearing? N  Vision? N  Difficulty concentrating or making decisions? N  Walking or climbing stairs? Y  Comment Chronic knee pain. Injections as needed every 3 months.  Dressing or bathing? N  Doing errands, shopping? N  Preparing Food and eating ? N  Using the Toilet? N  In the past six months,  have you accidently leaked urine? N  Do you have problems with loss of bowel control? N  Managing your Medications? N  Managing your Finances? N  Housekeeping or managing your Housekeeping? N  Some recent data might be hidden    Patient Care Team: Crecencio Mc, MD as PCP - General (Internal Medicine)  Indicate any recent Medical Services you may have received from other than [facility] providers in the past year (date may be approximate).  Assessment:   This is a routine wellness examination for Ivi.  I connected with Arla today by telephone and verified that I am speaking with the correct person using two identifiers. Location patient: home Location provider: work Persons participating in the virtual visit: patient, Marine scientist.    I discussed the limitations, risks, security and privacy concerns of performing an evaluation and management service by telephone and the availability of in person appointments. The patient expressed understanding and verbally consented to this telephonic visit.    Interactive audio and video telecommunications were attempted between this provider and patient, however failed, due to patient having technical difficulties OR patient did not have access to video capability.  We continued and completed visit with audio only.  Some vital signs may be absent or patient reported.   Hearing/Vision screen Hearing Screening - Comments:: Patient is able to hear conversational tones without difficulty. No issues reported. Vision Screening - Comments:: Followed by Dr. Mallie Mussel  Wears corrective lenses  They have seen their ophthalmologist in the last 12 months.   Dietary issues and exercise activities discussed: Current Exercise Habits: The patient does not participate in regular exercise at present Healthy diet  Good water intake   Goals Addressed               This Visit's Progress     Patient Stated     Increase physical activity (pt-stated)         Walk for exercise        Depression Screen PHQ 2/9 Scores 09/30/2020 08/14/2020 03/27/2020 08/07/2019 08/20/2018 08/06/2018 04/11/2016  PHQ - 2 Score 0 0 0 0 0 0 0  PHQ- 9 Score 0 1 1 - 2 - -    Fall Risk Fall Risk  09/30/2020 08/14/2020 04/15/2020 03/27/2020 11/07/2019  Falls in the past year? 0 0 1 0 0  Number falls in past yr: - - 0 - 0  Injury with Fall? - - 0 - 0  Follow up Falls evaluation completed Falls evaluation completed Falls evaluation completed Falls evaluation completed Falls evaluation completed    [place]: Adequate lighting in your home to reduce risk of falls? Yes   ASSISTIVE DEVICES UTILIZED TO PREVENT FALLS: Life alert? No  Use of a cane, walker or w/c? No   TIMED UP AND GO: Was the test performed? No .   Cognitive Function: Patient is alert and oriented x3.  Denies difficulty focusing, making decisions, memory loss.  MMSE/6CIT deferred. Normal by direct communication/observation.   MMSE - Mini Mental State Exam 08/07/2019  Not completed: Unable to complete     6CIT Screen 08/06/2018  What Year? 0 points  What month? 0 points  What time? 0 points  Count back from 20 0 points  Months in reverse 0 points  Repeat phrase 0 points  Total Score 0    Immunizations Immunization History  Administered Date(s) Administered   Fluad Quad(high Dose 65+) 11/04/2019   Influenza,inj,Quad PF,6+ Mos 12/08/2017, 09/07/2018   PFIZER(Purple Top)SARS-COV-2 Vaccination 02/08/2019, 03/01/2019, 10/15/2019   Pneumococcal Conjugate-13 04/28/2017   Tdap 02/28/2013    Shingrix vaccine- Due, Education has been provided regarding the importance of this vaccine. Advised may receive this vaccine at local pharmacy or Health Dept. Aware to provide a copy of the vaccination record if obtained from local pharmacy or Health Dept. Verbalized acceptance and understanding. Deferred.   PNA vaccine- Due, Education has been provided regarding the importance of  this vaccine. Advised  may receive this vaccine in office, at local pharmacy or Health Dept. Aware to provide a copy of the vaccination record if obtained from local pharmacy or Health Dept. Verbalized acceptance and understanding. Deferred.   Health Maintenance Health Maintenance  Topic Date Due   COVID-19 Vaccine (4 - Booster for Pfizer series) 10/16/2020 (Originally 01/07/2020)   Zoster Vaccines- Shingrix (1 of 2) 12/30/2020 (Originally 09/11/1969)   INFLUENZA VACCINE  04/16/2021 (Originally 08/17/2020)   PNA vac Low Risk Adult (2 of 2 - PPSV23) 09/30/2021 (Originally 04/29/2018)   MAMMOGRAM  07/02/2021   TETANUS/TDAP  03/01/2023   COLONOSCOPY (Pts 45-30yr Insurance coverage will need to be confirmed)  02/20/2025   DEXA SCAN  Completed   Hepatitis C Screening  Completed   HPV VACCINES  Aged Out   Lung Cancer Screening: (Low Dose CT Chest recommended if Age 69-80years, 30 pack-year currently smoking OR have quit w/in 15years.) does not qualify.   Vision Screening: Recommended annual ophthalmology exams for early detection of glaucoma and other disorders of the eye.  Dental Screening: Recommended annual dental exams for proper oral hygiene. Visits every 6 months.   Community Resource Referral / Chronic Care Management: CRR required this visit?  No   CCM required this visit?  No      Plan:   Keep all routine maintenance appointments.   I have personally reviewed and noted the following in the patient's chart:   Medical and social history Use of alcohol, tobacco or illicit drugs  Current medications and supplements including opioid prescriptions.  Functional ability and status Nutritional status Physical activity Advanced directives List of other physicians Hospitalizations, surgeries, and ER visits in previous 12 months Vitals Screenings to include cognitive, depression, and falls Referrals and appointments  In addition, I have reviewed and discussed with patient certain  preventive protocols, quality metrics, and best practice recommendations. A written personalized care plan for preventive services as well as general preventive health recommendations were provided to patient.     OBrien-Blaney, Jassica Zazueta L, LPN   [id number]    I have reviewed the above information and agree with above.   TDeborra Medina MD

## 2020-09-30 NOTE — Patient Instructions (Addendum)
  [name] , Thank you for taking time to come for your Medicare Wellness Visit. I appreciate your ongoing commitment to your health goals. Please review the following plan we discussed and let me know if I can assist you in the future.   These are the goals we discussed:  Goals       Patient Stated     Increase physical activity (pt-stated)      Walk for exercise         This is a list of the screening recommended for you and due dates:  Health Maintenance  Topic Date Due   COVID-19 Vaccine (4 - Booster for Pfizer series) 10/16/2020*   Zoster (Shingles) Vaccine (1 of 2) 12/30/2020*   Flu Shot  04/16/2021*   Pneumonia vaccines (2 of 2 - PPSV23) 09/30/2021*   Mammogram  07/02/2021   Tetanus Vaccine  03/01/2023   Colon Cancer Screening  02/20/2025   DEXA scan (bone density measurement)  Completed   Hepatitis C Screening: USPSTF Recommendation to screen - Ages 43-79 yo.  Completed   HPV Vaccine  Aged Out  *Topic was postponed. The date shown is not the original due date.

## 2020-10-09 ENCOUNTER — Other Ambulatory Visit: Payer: Self-pay

## 2020-10-09 ENCOUNTER — Inpatient Hospital Stay: Payer: Medicare PPO | Attending: Nurse Practitioner | Admitting: Nurse Practitioner

## 2020-10-09 ENCOUNTER — Other Ambulatory Visit: Payer: Self-pay | Admitting: Internal Medicine

## 2020-10-09 VITALS — BP 153/82 | HR 74 | Temp 98.2°F | Resp 18

## 2020-10-09 DIAGNOSIS — Z7901 Long term (current) use of anticoagulants: Secondary | ICD-10-CM

## 2020-10-09 DIAGNOSIS — Z86711 Personal history of pulmonary embolism: Secondary | ICD-10-CM | POA: Insufficient documentation

## 2020-10-09 NOTE — Progress Notes (Signed)
[facility]  [address] [place], [place] [address] Phone: [phone]  Fax: [phone]   Clinic Day:  10/09/2020  Referring physician: Crecencio Mc, MD  Chief Complaint: Michele Garza is a 70 y.o. female with bilateral pulmonary emboli is seen for 79-month evaluation  HPI:   Michele Garza is a 70 y.o. female with bilateral pulmonary emboli s/p thrombolysis and mechanical thrombectomy on 03/17/2020.  Prior to diagnosis activity level had declined secondary to left Achilles tendon injury.  She is on Eliquis.   Chest CT angiogram on 03/16/2020 revealed acute pulmonary emboli in both main pulmonary arteries and lobar arteries.  There was evidence of right heart strain suspicious for a submassive pulmonary embolism.  There was nonspecific inflammation at the porta hepatis with a small volume of perihepatic ascites.  There was a bulky thyroid goiter goiter with retrosternal extension and moderate compression of the trachea at the thoracic inlet.   Bilateral lower extremity duplex on 03/16/2020 revealed no evidence of a DVT.  Echo on 03/16/2020 revealed an ejection fraction of 55-60% with right ventricular function mildly reduced ventricular size moderately enlarged.     Hypercoagulable work-up included the following normal studies: CBC, Factor V Leiden, prothrombin gene mutation, lupus anticoagulant panel, anti-cardiolipin antibodies, beta-2 glycoprotein antibodies, protein C activity and antigen, protein S activity and antigen and antithrombin III activity.    Colonoscopy on 02/21/2020 was negative.  Mammogram on 06/24/2019 at [facility] was negative.   Thyroid ultrasound on 03/16/2020 revealed a left-sided thyroid nodule/mass correlating with the prior CT.  Recommendations were for a FNA for histopathology.  She will require thyroidectomy because of the deviation of the trachea and concern for airway problems in the future.  Surgery will be performed when she  is off blood thinners.  Thoracic surgery is required as the mass extends in to the mediastinum and down to the aortic arch.    She has a family history of breast and colon cancer.  Her mother had breast cancer x2 (age 47 and 52).  Her father had colon cancer at age 59.  A paternal grandmother also had colon cancer at age 68.  She denies any family history of thrombosis.  The patient received the COVID-19 vaccine and Booster shot.  Interval History: Patient is 70 year old female currently on Eliquis for previous history of bilateral pulmonary emboli who returns to clinic for labs, further evaluation.  She continues Eliquis and denies missing any doses.  Denies abnormal bleeding or bruising. Says she's tolerating it well.  She had a biopsy of thyroid goiter which was benign.  She is followed for this at [facility].  Denies any neurologic complaints. Denies recent fevers or illnesses. Denies any easy bleeding or bruising. No melena or hematochezia. No pica or restless leg. Reports good appetite and denies weight loss. Denies chest pain. Denies any nausea, vomiting, constipation, or diarrhea. Denies urinary complaints. Patient offers no further specific complaints today.  She has retired again.   Past Medical History:  Diagnosis Date  . Anxiety   . Depression   . Diverticulosis of colon    one prior episode July 2011 caused by tomato overingestion  . GERD (gastroesophageal reflux disease)   . History of colonoscopy    done in  dec 2011, repeat due 5 yrs due to [place]  . Hypertension     Past Surgical History:  Procedure Laterality Date  . BREAST SURGERY     sterotactic breaswt biopsy: atypical cells,  continued diagnostic  mammograms  . COLONOSCOPY    . COLONOSCOPY WITH PROPOFOL N/A 02/21/2020   Procedure: COLONOSCOPY WITH PROPOFOL;  Surgeon: Virgel Manifold, MD;  Location: [facility] ENDOSCOPY;  Service: Endoscopy;  Laterality: N/A;  . PULMONARY THROMBECTOMY Bilateral 03/17/2020   Procedure: PULMONARY  THROMBECTOMY / THROMBOLYSIS;  Surgeon: Katha Cabal, MD;  Location: [facility];  Service: Cardiovascular;  Laterality: Bilateral;    Family History  Problem Relation Age of Onset  . Cancer Mother        bilatera, contralateral recurred after 30 yrs  . Stroke Father   . Heart disease Father 36       died of stroke  at 61, tobacco user  . Cancer Father        colon Ca  . Cancer Paternal Grandmother 58       metastatic colon Ca  . Cancer Maternal Uncle        breast x 2 aunts    Social History:  reports that she has never smoked. She has never used smokeless tobacco. She reports current alcohol use of about 1.0 standard drink per week. She reports that she does not use drugs.  The patient denies any exposure to radiation or toxins.  She retired in 01/2020.  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  The patient lives in [place].   Allergies:  Allergies  Allergen Reactions  . Clindamycin/Lincomycin Rash    Current Medications: Current Outpatient Medications  Medication Sig Dispense Refill  . Acetaminophen (TYLENOL ARTHRITIS PAIN PO) Take by mouth.    Marland Kitchen atorvastatin (LIPITOR) 20 MG tablet Take 1 tablet (20 mg total) by mouth daily. 90 tablet 3  . buPROPion (WELLBUTRIN XL) 150 MG 24 hr tablet TAKE 1 TABLET BY MOUTH EVERY DAY 90 tablet 1  . Cholecalciferol (VITAMIN D3 PO) Take by mouth every evening.    Marland Kitchen ELIQUIS 5 MG TABS tablet TAKE 1 TABLET BY MOUTH TWICE A DAY 60 tablet 1  . esomeprazole (NEXIUM) 20 MG capsule Take 20 mg by mouth daily as needed.    . fish oil-omega-3 fatty acids 1000 MG capsule Take 2 g by mouth every evening.    Marland Kitchen losartan (COZAAR) 25 MG tablet TAKE 1 TABLET (25 MG TOTAL) BY MOUTH DAILY. 90 tablet 1  . metoprolol succinate (TOPROL-XL) 50 MG 24 hr tablet TAKE 1 TABLET BY MOUTH EVERY DAY 90 tablet 1  . Multiple Vitamin (MULTIVITAMIN) tablet Take 1 tablet by  mouth every evening.    Marland Kitchen PREMARIN vaginal cream USE 1 TO 2 GRAMS INTRAVAGINALLY TWICE WEEKLY 30 g 2   No current facility-administered medications for this visit.    Review of Systems  Constitutional:  Negative for chills, fever, malaise/fatigue and weight loss.  HENT:  Negative for hearing loss, nosebleeds, sore throat and tinnitus.   Eyes:  Negative for blurred vision and double vision.  Respiratory:  Negative for cough, hemoptysis, shortness of breath and wheezing.   Cardiovascular:  Negative for chest pain, palpitations and leg swelling.  Gastrointestinal:  Negative for abdominal pain, blood in stool, constipation, diarrhea, melena, nausea and vomiting.  Genitourinary:  Negative for dysuria, hematuria and urgency.  Musculoskeletal:  Negative for back pain, falls, joint pain and myalgias.  Skin:  Negative for itching and rash.  Neurological:  Negative for dizziness, tingling, sensory change, loss of consciousness, weakness and headaches.  Endo/Heme/Allergies:  Negative for environmental allergies. Does not  bruise/bleed easily.  Psychiatric/Behavioral:  Negative for depression. The patient is not nervous/anxious and does not have insomnia.   Performance status (ECOG): 0  Vitals Blood pressure (!) 153/82, pulse 74, temperature 98.2 F (36.8 C), temperature source Tympanic, resp. rate 18.   General: Well-developed, well-nourished, no acute distress. Eyes: Pink conjunctiva, anicteric sclera. Lungs: Clear to auscultation bilaterally.  No audible wheezing or coughing Heart: Regular rate and rhythm.  Abdomen: Soft, nontender, nondistended.  Musculoskeletal: No edema, cyanosis, or clubbing. Neuro: Alert, answering all questions appropriately. Cranial nerves grossly intact. Skin: No rashes or petechiae noted. Psych: Normal affect.   No visits with results within 3 Day(s) from this visit.  Latest known visit with results is:  Lab on 09/04/2020  Component Date Value Ref Range Status   . SARS COV2 AB, Total Spike Semi QN 09/04/2020 1,111.0 (A) <0.8 U/mL Final   Comment: . INDEX          INTERPRETATION -----          --------------  <0.8          Negative > or = 0.8     Positive . This test is intended to help identify individuals with antibodies to SARS-CoV-2 (COVID-19). The results of this semi-quantitative test should not be interpreted as an indication or degree of immunity or protection from reinfection. . A test result that is 0.8 or more (Positive) means antibodies to SARS-CoV-2 were detected in the blood sample by the test. This could mean that the individual may have an immune response to a recent or prior infection with SARS-CoV-2. Positive results may occur after COVID-19 vaccination, but the clinical significance of a positive antibody result for individuals that have received a COVID-19 vaccine is unknown, and the performance of the test has not been established in COVID-19 vaccinees.  False positive results for the test may occur due to cross-reactivity from pre-existing antibodies or other possible causes. . . A                           test result that is less than 0.8 (Negative) means that antibodies were not detected in the blood sample by the test. This could mean that the individual has not been previously infected with SARS-CoV-2. The clinical significance of a negative antibody result for individuals that have received a COVID-19 vaccine is unknown. The performance of the test has not been established in COVID-19 vaccinees. False negative results for the test may occur if the individual's antibodies have not reached a sufficient level for the test to be able to detect them.  Antibodies can take up to two to three weeks (sometimes longer) to develop after someone is infected.  How long antibodies to SARS-CoV-2 last after infection is not known. . This test should not be used to diagnose an active SARS-CoV-2 infection. If an active  infection is suspected, direct molecular or antigen testing for SARS -CoV-2 is recommended. Please review the "Fact Sheets" available for healthcare providers and                           patients using the following websites: https://[email or web address]/home/Covid-19/HCP/ antibody/fact-sheet7 https://www.[facility].com/home/Covid-19/ Patients/antibody/fact-sheet7 . Healthcare Providers: For additional information please refer to http://education.[facility].com/ faq/FAQ219(This link is being provided for informational/educational purposes only.) . This test has been authorized by the FDA under an Emergency Use Authorization(EUA)for use by authorized laboratories. The FDA authorized labeling is available on  the [employer] website: www.[facility]. com/Covid19. .   . Total Bilirubin 09/04/2020 0.9  0.2 - 1.2 mg/dL Final  . Bilirubin, Direct 09/04/2020 0.2  0.0 - 0.3 mg/dL Final  . Alkaline Phosphatase 09/04/2020 53  39 - 117 U/L Final  . AST 09/04/2020 19  0 - 37 U/L Final  . ALT 09/04/2020 20  0 - 35 U/L Final  . Total Protein 09/04/2020 6.6  6.0 - 8.3 g/dL Final  . Albumin 09/04/2020 4.1  3.5 - 5.2 g/dL Final    Assessment:    1.   Bilateral pulmonary emboli-negative hypercoagulable work-up.  No current evidence of malignancy by her report screenings are up-to-date.  She is followed by her PCP, Dr. Derrel Nip. S/p thrombectomy. Currently on eliquis. Tolerating well without side effects.  Given the life-threatening pulmonary embolism, agree with recommendation for lifelong anticoagulation. Continue eliquis 5 mg twice daily.   2.   Normocytic anemia- resolved  3.    Increased LFTs- resolved  Plan:  6 mo- lab (cbc), MD to establish care  I discussed the assessment and treatment plan with the patient.  The patient was provided an opportunity to ask questions and all were answered.  The patient agreed with the plan and demonstrated an understanding of  the instructions.  The patient was advised to call back if the symptoms worsen or if the condition fails to improve as anticipated.  Beckey Rutter, DNP, AGNP-C  CC: Dr. Derrel Nip

## 2020-10-13 DIAGNOSIS — H5213 Myopia, bilateral: Secondary | ICD-10-CM | POA: Diagnosis not present

## 2020-10-13 DIAGNOSIS — H2513 Age-related nuclear cataract, bilateral: Secondary | ICD-10-CM | POA: Diagnosis not present

## 2020-10-13 DIAGNOSIS — H52223 Regular astigmatism, bilateral: Secondary | ICD-10-CM | POA: Diagnosis not present

## 2020-10-13 DIAGNOSIS — H43813 Vitreous degeneration, bilateral: Secondary | ICD-10-CM | POA: Diagnosis not present

## 2020-10-13 DIAGNOSIS — H35372 Puckering of macula, left eye: Secondary | ICD-10-CM | POA: Diagnosis not present

## 2020-10-19 DIAGNOSIS — M170 Bilateral primary osteoarthritis of knee: Secondary | ICD-10-CM | POA: Diagnosis not present

## 2020-10-20 DIAGNOSIS — I2699 Other pulmonary embolism without acute cor pulmonale: Secondary | ICD-10-CM | POA: Diagnosis not present

## 2020-10-20 DIAGNOSIS — K6289 Other specified diseases of anus and rectum: Secondary | ICD-10-CM | POA: Diagnosis not present

## 2020-11-05 ENCOUNTER — Encounter: Payer: Medicare PPO | Admitting: Internal Medicine

## 2020-11-22 ENCOUNTER — Other Ambulatory Visit: Payer: Self-pay | Admitting: Internal Medicine

## 2020-11-30 ENCOUNTER — Ambulatory Visit (INDEPENDENT_AMBULATORY_CARE_PROVIDER_SITE_OTHER): Payer: Medicare PPO | Admitting: Internal Medicine

## 2020-11-30 ENCOUNTER — Encounter: Payer: Self-pay | Admitting: Internal Medicine

## 2020-11-30 ENCOUNTER — Other Ambulatory Visit: Payer: Self-pay

## 2020-11-30 VITALS — BP 126/88 | HR 81 | Temp 96.6°F | Ht 63.0 in | Wt 211.4 lb

## 2020-11-30 DIAGNOSIS — Z6841 Body Mass Index (BMI) 40.0 and over, adult: Secondary | ICD-10-CM

## 2020-11-30 DIAGNOSIS — I10 Essential (primary) hypertension: Secondary | ICD-10-CM

## 2020-11-30 DIAGNOSIS — R7301 Impaired fasting glucose: Secondary | ICD-10-CM

## 2020-11-30 DIAGNOSIS — E7800 Pure hypercholesterolemia, unspecified: Secondary | ICD-10-CM | POA: Diagnosis not present

## 2020-11-30 DIAGNOSIS — E049 Nontoxic goiter, unspecified: Secondary | ICD-10-CM

## 2020-11-30 DIAGNOSIS — Z0000 Encounter for general adult medical examination without abnormal findings: Secondary | ICD-10-CM | POA: Diagnosis not present

## 2020-11-30 DIAGNOSIS — Z23 Encounter for immunization: Secondary | ICD-10-CM

## 2020-11-30 DIAGNOSIS — D649 Anemia, unspecified: Secondary | ICD-10-CM | POA: Diagnosis not present

## 2020-11-30 DIAGNOSIS — F324 Major depressive disorder, single episode, in partial remission: Secondary | ICD-10-CM | POA: Diagnosis not present

## 2020-11-30 DIAGNOSIS — Z86711 Personal history of pulmonary embolism: Secondary | ICD-10-CM

## 2020-11-30 DIAGNOSIS — I700 Atherosclerosis of aorta: Secondary | ICD-10-CM

## 2020-11-30 NOTE — Progress Notes (Addendum)
Patient ID: Michele Garza, female    DOB: 10-30-1950  Age: 70 y.o. MRN: [id number]  The patient is here for annual preventive examination and management of other chronic and acute problems.   The risk factors are reflected in the social history.  The roster of all physicians providing medical care to patient - is listed in the Snapshot section of the chart.  Activities of daily living:  The patient is 100% independent in all ADLs: dressing, toileting, feeding as well as independent mobility  Home safety : The patient has smoke detectors in the home. They wear seatbelts.  There are no firearms at home. There is no violence in the home.   There is no risks for hepatitis, STDs or HIV. There is no   history of blood transfusion. They have no travel history to infectious disease endemic areas of the world.  The patient has seen their dentist in the last six month. They have seen their eye doctor in the last year. She denies  hearing difficulty with regard to whispered voices and some television programs.  They have deferred audiologic testing in the last year.  They do not  have excessive sun exposure. Discussed the need for sun protection: hats, long sleeves and use of sunscreen if there is significant sun exposure.   Diet: the importance of a healthy diet is discussed. She is following a low glycemic index diet and has maintained a 41 lb weight loss over the past year .  The benefits of regular aerobic exercise were discussed. She walks 4 times per week ,  20 minutes.   Depression screen: there are no signs or vegative symptoms of depression- irritability, change in appetite, anhedonia, sadness/tearfullness.  Cognitive assessment: the patient manages all their financial and personal affairs and is actively engaged. They could relate day,date,year and events; recalled 2/3 objects at 3 minutes; performed clock-face test normally.  The following portions of the patient's history were reviewed and  updated as appropriate: allergies, current medications, past family history, past medical history,  past surgical history, past social history  and problem list.  Visual acuity was not assessed per patient preference since she has regular follow up with her ophthalmologist. Hearing and body mass index were assessed and reviewed.   During the course of the visit the patient was educated and counseled about appropriate screening and preventive services including : fall prevention , diabetes screening, nutrition counseling, colorectal cancer screening, and recommended immunizations.    CC: The primary encounter diagnosis was Major depressive disorder with single episode, in partial remission ([place]). Diagnoses of Retrosternal goiter, Primary hypertension, Pure hypercholesterolemia, Normocytic anemia, Impaired fasting glucose, Need for immunization against influenza, History of pulmonary embolus (PE), Morbid obesity with BMI of 40.0-44.9, adult ([place]), Encounter for preventive health examination, and Aortic atherosclerosis ([place]) were also pertinent to this visit.  1) Has been taking Eliquis  for treatment of a massive bilateral PE that occurred during a period of relative immobilization following an achilles tendon rupture in . Lifelong planned due to size of PE .  See Michele Garza in March.  Discussed repeating the CT in January after 6 months of therapy  2) Aortic atherosclerosis:  Reviewed findings of prior CT scan today..  Patient is tolerating high potency statin therapy     3) mother's BRCA has returned,  metastatic.  Mother is an independent Widow but since this diagnosis has moved to A/L and is stressing the family members  .  Brother has POA; mother wants  24 hour company ,   4) has been retired since her hospitalization in March.  Thinking of returning to work part time after mother passes,  she is receiving hospice care  5) walking 6000 steps daily with dog.limited by bilateral knee pain . knee films have  been done :  no bony erosion  per krasinksi  History Michele Garza has a past medical history of Anxiety, Depression, Diverticulosis of colon, GERD (gastroesophageal reflux disease), History of colonoscopy, Hypertension, and Pulmonary emboli ([place]) (03/16/2020).   She has a past surgical history that includes Breast surgery; Colonoscopy; Colonoscopy with propofol (N/A, 02/21/2020); and PULMONARY THROMBECTOMY (Bilateral, 03/17/2020).   Her family history includes Cancer in her father, maternal uncle, and mother; Cancer (age of onset: 61) in her paternal grandmother; Heart disease (age of onset: 46) in her father; Stroke in her father.She reports that she has never smoked. She has never used smokeless tobacco. She reports current alcohol use of about 1.0 standard drink per week. She reports that she does not use drugs.  Outpatient Medications Prior to Visit  Medication Sig Dispense Refill   Acetaminophen (TYLENOL ARTHRITIS PAIN PO) Take by mouth.     atorvastatin (LIPITOR) 20 MG tablet Take 1 tablet (20 mg total) by mouth daily. 90 tablet 3   buPROPion (WELLBUTRIN XL) 150 MG 24 hr tablet TAKE 1 TABLET BY MOUTH EVERY DAY 90 tablet 1   Cholecalciferol (VITAMIN D3 PO) Take by mouth every evening.     ELIQUIS 5 MG TABS tablet TAKE 1 TABLET BY MOUTH TWICE A DAY 60 tablet 1   esomeprazole (NEXIUM) 20 MG capsule Take 20 mg by mouth daily as needed.     fish oil-omega-3 fatty acids 1000 MG capsule Take 2 g by mouth every evening.     losartan (COZAAR) 25 MG tablet TAKE 1 TABLET (25 MG TOTAL) BY MOUTH DAILY. 90 tablet 1   metoprolol succinate (TOPROL-XL) 50 MG 24 hr tablet TAKE 1 TABLET BY MOUTH EVERY DAY 90 tablet 1   Multiple Vitamin (MULTIVITAMIN) tablet Take 1 tablet by mouth every evening.     PREMARIN vaginal cream USE 1 TO 2 GRAMS INTRAVAGINALLY TWICE WEEKLY 30 g 2   No facility-administered medications prior to visit.    Review of Systems  Objective:  BP 126/88 (BP Location: Left Arm, Patient Position:  Sitting, Cuff Size: Large)   Pulse 81   Temp (!) 96.6 F (35.9 C) (Temporal)   Ht 5' 3" (1.6 m)   Wt 211 lb 6.4 oz (95.9 kg)   SpO2 97%   BMI 37.45 kg/m   Physical Exam  Physical Exam   Assessment & Plan:   Problem List Items Addressed This Visit     Hypertension    Well controlled on current regimen of metoprolol and losartan.  Renal function stable, no changes today.      Relevant Orders   Comprehensive metabolic panel (Completed)   Major depressive disorder in partial remission ([place]) - Primary    She prefers to continue wellbutrin  Given the increased stress due to her mother's metastatic cancer diagnosis       Morbid obesity with BMI of 40.0-44.9, adult ([place])    I have congratulated her in her weight loss of 41 lbs since March 2022 and her abillity to maintain the weight loss.  I encouraged  Continued weight loss with goal of 10% of body weight over the next 6 months using a low glycemic index diet and regular exercise a minimum  of 5 days per week.        Encounter for preventive health examination    age appropriate education and counseling updated, referrals for preventative services and immunizations addressed, dietary and smoking counseling addressed, most recent labs reviewed.  I have personally reviewed and have noted:   1) the patient's medical and social history 2) The pt's use of alcohol, tobacco, and illicit drugs 3) The patient's current medications and supplements 4) Functional ability including ADL's, fall risk, home safety risk, hearing and visual impairment 5) Diet and physical activities 6) Evidence for depression or mood disorder 7) The patient's height, weight, and BMI have been recorded in the chart   I have made referrals, and provided counseling and education based on review of the above      Hyperlipidemia   Relevant Orders   Lipid panel (Completed)   Normocytic anemia   Relevant Orders   CBC with Differential/Platelet (Completed)    Retrosternal goiter    Repeat scan is planned in December by [facility] endocrinologist .she remains asymptomatic. FNA of thyrod nodules done in April by ENT  Was benign, and current thyroid function is normal.  Lab Results  Component Value Date   TSH 0.64 11/30/2020            Relevant Orders   Thyroid Panel With TSH (Completed)   History of pulmonary embolus (PE)    She is apprehensive about continuing lifelong anticoagulation .  Hypercoag workup was negative and PE occurred in the setting of relative immobility  With no cancer diagnosis or surgery .  She has completed 6 months of anticoagulation.  Will repeat CT chest        Relevant Orders   CT Angio Chest W/Cm &/Or Wo Cm   Aortic atherosclerosis [facility])    Reviewed findings of prior CT scan today..  Patient is tolerating high potency statin therapy       Other Visit Diagnoses     Impaired fasting glucose       Relevant Orders   Hemoglobin A1c (Completed)   Need for immunization against influenza       Relevant Orders   Flu Vaccine QUAD High Dose(Fluad) (Completed)       I am having Michele Garza maintain her multivitamin, fish oil-omega-3 fatty acids, Premarin, Cholecalciferol (VITAMIN D3 PO), esomeprazole, Acetaminophen (TYLENOL ARTHRITIS PAIN PO), atorvastatin, losartan, buPROPion, metoprolol succinate, and Eliquis.  No orders of the defined types were placed in this encounter.   There are no discontinued medications.  Follow-up: No follow-ups on file.   Crecencio Mc, MD

## 2020-11-30 NOTE — Assessment & Plan Note (Signed)
I have congratulated her in her weight loss of 41 lbs since March 2022 and her abillity to maintain the weight loss.  I encouraged  Continued weight loss with goal of 10% of body weight over the next 6 months using a low glycemic index diet and regular exercise a minimum of 5 days per week.

## 2020-11-30 NOTE — Assessment & Plan Note (Signed)

## 2020-11-30 NOTE — Assessment & Plan Note (Signed)
Well controlled on current regimen of metoprolol and losartan . Renal function stable, no changes today.   

## 2020-11-30 NOTE — Assessment & Plan Note (Addendum)
Submassive, Lifelong anticoagulation planned  Sees Michele Garza in march

## 2020-11-30 NOTE — Assessment & Plan Note (Signed)
She is apprehensive about continuing lifelong anticoagulation .  Hypercoag workup was negative and PE occurred in the setting of relative immobility  With no cancer diagnosis or surgery .  She has completed 6 months of anticoagulation.  Will repeat CT chest

## 2020-11-30 NOTE — Assessment & Plan Note (Addendum)
Repeat scan is planned in December by [facility] endocrinologist .she remains asymptomatic. FNA of thyrod nodules done in April by ENT  Was benign, and current thyroid function is normal.  Lab Results  Component Value Date   TSH 0.64 11/30/2020

## 2020-11-30 NOTE — Assessment & Plan Note (Signed)
She prefers to continue wellbutrin  Given the increased stress due to her mother's metastatic cancer diagnosis

## 2020-12-01 DIAGNOSIS — I700 Atherosclerosis of aorta: Secondary | ICD-10-CM | POA: Insufficient documentation

## 2020-12-01 LAB — COMPREHENSIVE METABOLIC PANEL
ALT: 22 U/L (ref 0–35)
AST: 21 U/L (ref 0–37)
Albumin: 4.4 g/dL (ref 3.5–5.2)
Alkaline Phosphatase: 51 U/L (ref 39–117)
BUN: 18 mg/dL (ref 6–23)
CO2: 31 mEq/L (ref 19–32)
Calcium: 9.7 mg/dL (ref 8.4–10.5)
Chloride: 102 mEq/L (ref 96–112)
Creatinine, Ser: 0.93 mg/dL (ref 0.40–1.20)
GFR: 62.84 mL/min (ref >60.00)
Glucose, Bld: 99 mg/dL (ref 70–99)
Potassium: 3.8 mEq/L (ref 3.5–5.1)
Sodium: 141 mEq/L (ref 135–145)
Total Bilirubin: 0.8 mg/dL (ref 0.2–1.2)
Total Protein: 6.6 g/dL (ref 6.0–8.3)

## 2020-12-01 LAB — CBC WITH DIFFERENTIAL/PLATELET
Basophils Absolute: 0.1 10*3/uL (ref 0.0–0.1)
Basophils Relative: 0.9 % (ref 0.0–3.0)
Eosinophils Absolute: 0.1 10*3/uL (ref 0.0–0.7)
Eosinophils Relative: 1.3 % (ref 0.0–5.0)
HCT: 40.9 % (ref 36.0–46.0)
Hemoglobin: 13.6 g/dL (ref 12.0–15.0)
Lymphocytes Relative: 25.2 % (ref 12.0–46.0)
Lymphs Abs: 1.9 10*3/uL (ref 0.7–4.0)
MCHC: 33.2 g/dL (ref 30.0–36.0)
MCV: 96.3 fl (ref 78.0–100.0)
Monocytes Absolute: 0.6 10*3/uL (ref 0.1–1.0)
Monocytes Relative: 8 % (ref 3.0–12.0)
Neutro Abs: 4.9 10*3/uL (ref 1.4–7.7)
Neutrophils Relative %: 64.6 % (ref 43.0–77.0)
Platelets: 221 10*3/uL (ref 150.0–400.0)
RBC: 4.25 Mil/uL (ref 3.87–5.11)
RDW: 13.7 % (ref 11.5–15.5)
WBC: 7.6 10*3/uL (ref 4.0–10.5)

## 2020-12-01 LAB — LIPID PANEL
Cholesterol: 181 mg/dL (ref 0–200)
HDL: 67.4 mg/dL (ref >39.00)
LDL Cholesterol: 103 mg/dL — ABNORMAL HIGH (ref 0–99)
NonHDL: 113.89
Total CHOL/HDL Ratio: 3
Triglycerides: 52 mg/dL (ref 0.0–149.0)
VLDL: 10.4 mg/dL (ref 0.0–40.0)

## 2020-12-01 LAB — THYROID PANEL WITH TSH
Free Thyroxine Index: 2.5 (ref 1.4–3.8)
T3 Uptake: 29 % (ref 22–35)
T4, Total: 8.6 ug/dL (ref 5.1–11.9)
TSH: 0.64 mIU/L (ref 0.40–4.50)

## 2020-12-01 LAB — HEMOGLOBIN A1C: Hgb A1c MFr Bld: 5.9 % (ref 4.6–6.5)

## 2020-12-01 NOTE — Assessment & Plan Note (Signed)
Reviewed findings of prior CT scan today..  Patient is tolerating high potency statin therapy  

## 2020-12-21 DIAGNOSIS — E042 Nontoxic multinodular goiter: Secondary | ICD-10-CM | POA: Diagnosis not present

## 2020-12-21 DIAGNOSIS — E049 Nontoxic goiter, unspecified: Secondary | ICD-10-CM | POA: Diagnosis not present

## 2020-12-30 DIAGNOSIS — E041 Nontoxic single thyroid nodule: Secondary | ICD-10-CM | POA: Diagnosis not present

## 2020-12-30 DIAGNOSIS — I2699 Other pulmonary embolism without acute cor pulmonale: Secondary | ICD-10-CM | POA: Diagnosis not present

## 2020-12-30 DIAGNOSIS — E049 Nontoxic goiter, unspecified: Secondary | ICD-10-CM | POA: Diagnosis not present

## 2021-01-20 ENCOUNTER — Other Ambulatory Visit: Payer: Self-pay | Admitting: Internal Medicine

## 2021-01-20 ENCOUNTER — Other Ambulatory Visit: Payer: Self-pay

## 2021-01-20 ENCOUNTER — Ambulatory Visit
Admission: RE | Admit: 2021-01-20 | Discharge: 2021-01-20 | Disposition: A | Discharge status: Home / Self Care | Payer: Medicare PPO | Source: Ambulatory Visit | Attending: Internal Medicine | Admitting: Internal Medicine

## 2021-01-20 DIAGNOSIS — I517 Cardiomegaly: Secondary | ICD-10-CM | POA: Diagnosis not present

## 2021-01-20 DIAGNOSIS — I2510 Atherosclerotic heart disease of native coronary artery without angina pectoris: Secondary | ICD-10-CM | POA: Diagnosis not present

## 2021-01-20 DIAGNOSIS — Z86711 Personal history of pulmonary embolism: Secondary | ICD-10-CM | POA: Diagnosis not present

## 2021-01-20 DIAGNOSIS — I700 Atherosclerosis of aorta: Secondary | ICD-10-CM | POA: Diagnosis not present

## 2021-01-20 LAB — POCT I-STAT CREATININE: Creatinine, Ser: 1 mg/dL (ref 0.44–1.00)

## 2021-01-20 MED ORDER — IOHEXOL 350 MG/ML SOLN
75.0000 mL | Freq: Once | INTRAVENOUS | Status: AC | PRN
  Administered 2021-01-20: 75 mL via INTRAVENOUS

## 2021-01-21 NOTE — Assessment & Plan Note (Signed)
Repeat CTA jan 2022  Negative for acute or chronic PE

## 2021-02-10 DIAGNOSIS — M170 Bilateral primary osteoarthritis of knee: Secondary | ICD-10-CM | POA: Diagnosis not present

## 2021-02-18 ENCOUNTER — Encounter (HOSPITAL_COMMUNITY): Payer: Self-pay | Admitting: Radiology

## 2021-03-21 ENCOUNTER — Other Ambulatory Visit: Payer: Self-pay | Admitting: Internal Medicine

## 2021-04-05 ENCOUNTER — Other Ambulatory Visit: Payer: Self-pay | Admitting: Internal Medicine

## 2021-04-09 ENCOUNTER — Ambulatory Visit: Payer: Medicare PPO | Admitting: Oncology

## 2021-04-09 ENCOUNTER — Other Ambulatory Visit: Payer: Medicare PPO

## 2021-04-15 DIAGNOSIS — H2513 Age-related nuclear cataract, bilateral: Secondary | ICD-10-CM | POA: Diagnosis not present

## 2021-04-15 DIAGNOSIS — H43813 Vitreous degeneration, bilateral: Secondary | ICD-10-CM | POA: Diagnosis not present

## 2021-04-15 DIAGNOSIS — H35372 Puckering of macula, left eye: Secondary | ICD-10-CM | POA: Diagnosis not present

## 2021-04-15 DIAGNOSIS — H52223 Regular astigmatism, bilateral: Secondary | ICD-10-CM | POA: Diagnosis not present

## 2021-04-15 DIAGNOSIS — H5213 Myopia, bilateral: Secondary | ICD-10-CM | POA: Diagnosis not present

## 2021-05-04 ENCOUNTER — Telehealth: Payer: Self-pay | Admitting: Oncology

## 2021-05-04 NOTE — Telephone Encounter (Signed)
pt called stating she cannot make appt, has to travel to care for mom KJ  ?

## 2021-05-07 ENCOUNTER — Inpatient Hospital Stay: Payer: Medicare PPO

## 2021-05-07 ENCOUNTER — Inpatient Hospital Stay: Payer: Medicare PPO | Admitting: Oncology

## 2021-05-11 ENCOUNTER — Other Ambulatory Visit: Payer: Self-pay | Admitting: Internal Medicine

## 2021-05-31 ENCOUNTER — Ambulatory Visit: Payer: Medicare PPO | Admitting: Internal Medicine

## 2021-05-31 DIAGNOSIS — M170 Bilateral primary osteoarthritis of knee: Secondary | ICD-10-CM | POA: Diagnosis not present

## 2021-06-03 ENCOUNTER — Other Ambulatory Visit: Payer: Self-pay

## 2021-06-03 DIAGNOSIS — I2699 Other pulmonary embolism without acute cor pulmonale: Secondary | ICD-10-CM

## 2021-06-03 DIAGNOSIS — Z7901 Long term (current) use of anticoagulants: Secondary | ICD-10-CM

## 2021-06-04 ENCOUNTER — Encounter: Payer: Self-pay | Admitting: Oncology

## 2021-06-04 ENCOUNTER — Inpatient Hospital Stay: Payer: Medicare PPO | Admitting: Oncology

## 2021-06-04 ENCOUNTER — Inpatient Hospital Stay: Payer: Medicare PPO | Attending: Oncology

## 2021-06-04 VITALS — BP 155/85 | HR 74 | Temp 98.2°F | Resp 16 | Wt 224.0 lb

## 2021-06-04 DIAGNOSIS — Z86711 Personal history of pulmonary embolism: Secondary | ICD-10-CM | POA: Insufficient documentation

## 2021-06-04 DIAGNOSIS — I2699 Other pulmonary embolism without acute cor pulmonale: Secondary | ICD-10-CM | POA: Diagnosis not present

## 2021-06-04 DIAGNOSIS — Z7901 Long term (current) use of anticoagulants: Secondary | ICD-10-CM | POA: Diagnosis not present

## 2021-06-04 DIAGNOSIS — Z79899 Other long term (current) drug therapy: Secondary | ICD-10-CM | POA: Insufficient documentation

## 2021-06-04 LAB — CBC WITH DIFFERENTIAL/PLATELET
Abs Immature Granulocytes: 0.04 10*3/uL (ref 0.00–0.07)
Basophils Absolute: 0 10*3/uL (ref 0.0–0.1)
Basophils Relative: 0 %
Eosinophils Absolute: 0.1 10*3/uL (ref 0.0–0.5)
Eosinophils Relative: 1 %
HCT: 41.8 % (ref 36.0–46.0)
Hemoglobin: 14.1 g/dL (ref 12.0–15.0)
Immature Granulocytes: 0 %
Lymphocytes Relative: 39 %
Lymphs Abs: 3.9 10*3/uL (ref 0.7–4.0)
MCH: 31.8 pg (ref 26.0–34.0)
MCHC: 33.7 g/dL (ref 30.0–36.0)
MCV: 94.1 fL (ref 80.0–100.0)
Monocytes Absolute: 0.9 10*3/uL (ref 0.1–1.0)
Monocytes Relative: 9 %
Neutro Abs: 5.3 10*3/uL (ref 1.7–7.7)
Neutrophils Relative %: 51 %
Platelets: 216 10*3/uL (ref 150–400)
RBC: 4.44 MIL/uL (ref 3.87–5.11)
RDW: 12.6 % (ref 11.5–15.5)
WBC: 10.2 10*3/uL (ref 4.0–10.5)
nRBC: 0 % (ref 0.0–0.2)

## 2021-06-04 LAB — COMPREHENSIVE METABOLIC PANEL
ALT: 25 U/L (ref 0–44)
AST: 24 U/L (ref 15–41)
Albumin: 4.4 g/dL (ref 3.5–5.0)
Alkaline Phosphatase: 60 U/L (ref 38–126)
Anion gap: 9 (ref 5–15)
BUN: 27 mg/dL — ABNORMAL HIGH (ref 8–23)
CO2: 27 mmol/L (ref 22–32)
Calcium: 9.6 mg/dL (ref 8.9–10.3)
Chloride: 104 mmol/L (ref 98–111)
Creatinine, Ser: 1.27 mg/dL — ABNORMAL HIGH (ref 0.44–1.00)
GFR, Estimated: 46 mL/min — ABNORMAL LOW (ref >60)
Glucose, Bld: 108 mg/dL — ABNORMAL HIGH (ref 70–99)
Potassium: 4 mmol/L (ref 3.5–5.1)
Sodium: 140 mmol/L (ref 135–145)
Total Bilirubin: 0.5 mg/dL (ref 0.3–1.2)
Total Protein: 7.5 g/dL (ref 6.5–8.1)

## 2021-06-04 NOTE — Progress Notes (Signed)
Hematology/Oncology Consult note [facility]  [phone] [phone]  Patient Care Team: Crecencio Mc, MD as PCP - General (Internal Medicine)   Name of the patient: Michele Garza  [id number]  Mar 19, 1950   Date of visit: 06/04/21  Diagnosis-history of unprovoked bilateral pulmonary embolism  Chief complaint/ Reason for visit-routine follow-up of pulmonary embolism on Eliquis  Heme/Onc history: Patient is a 71 year old female diagnosed with bilateral pulmonary emboli s/p thrombolysis and mechanical thrombectomy on 03/17/2020.  Hypercoagulable work-up negative.  She has been on Eliquis since then.  Bilateral lower extremity Doppler was negative for DVT.  Echo at that time showed right ventricular function mildly reduced and cavity moderately enlarged.  Patient also found to have a large goiter for which she follows up with ENT at [facility] which is being monitored conservatively.  Interval history-patient reports doing well on the Eliquis presently and denies any specific complaints at this time.  ECOG PS- 0 Pain scale- 0   Review of systems- Review of Systems  Constitutional:  Negative for chills, fever, malaise/fatigue and weight loss.  HENT:  Negative for congestion, ear discharge and nosebleeds.   Eyes:  Negative for blurred vision.  Respiratory:  Negative for cough, hemoptysis, sputum production, shortness of breath and wheezing.   Cardiovascular:  Negative for chest pain, palpitations, orthopnea and claudication.  Gastrointestinal:  Negative for abdominal pain, blood in stool, constipation, diarrhea, heartburn, melena, nausea and vomiting.  Genitourinary:  Negative for dysuria, flank pain, frequency, hematuria and urgency.  Musculoskeletal:  Negative for back pain, joint pain and myalgias.  Skin:  Negative for rash.  Neurological:  Negative for dizziness, tingling, focal weakness, seizures, weakness and headaches.   Endo/Heme/Allergies:  Does not bruise/bleed easily.  Psychiatric/Behavioral:  Negative for depression and suicidal ideas. The patient does not have insomnia.      Allergies  Allergen Reactions   Clindamycin/Lincomycin Rash     Past Medical History:  Diagnosis Date   Anxiety    Depression    Diverticulosis of colon    one prior episode July 2011 caused by tomato overingestion   GERD (gastroesophageal reflux disease)    History of colonoscopy    done in  dec 2011, repeat due 5 yrs due to [facility]   Hypertension    Pulmonary emboli ([place]) 03/16/2020     Past Surgical History:  Procedure Laterality Date   BREAST SURGERY     sterotactic breaswt biopsy: atypical cells,  continued diagnostic mammograms   COLONOSCOPY     COLONOSCOPY WITH PROPOFOL N/A 02/21/2020   Procedure: COLONOSCOPY WITH PROPOFOL;  Surgeon: Virgel Manifold, MD;  Location: [facility] ENDOSCOPY;  Service: Endoscopy;  Laterality: N/A;   PULMONARY THROMBECTOMY Bilateral 03/17/2020   Procedure: PULMONARY THROMBECTOMY / THROMBOLYSIS;  Surgeon: Katha Cabal, MD;  Location: [place] [facility];  Service: Cardiovascular;  Laterality: Bilateral;    Social History   Socioeconomic History   Marital status: Divorced    Spouse name: Not on file   Number of children: Not on file   Years of education: Not on file   Highest education level: Not on file  Occupational History   Occupation: Glass blower/designer    Employer: 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[place])  Tobacco Use   Smoking status: Never   Smokeless tobacco: Never  Vaping Use   Vaping Use: Never used  Substance and Sexual Activity   Alcohol use: Yes    Alcohol/week:  1.0 standard drink    Types: 1 Shots of liquor per week    Comment: occasional   Drug use: No   Sexual activity: Not on file  Other Topics Concern   Not on file  Social History Narrative   Not on file   Social Determinants of Health   Financial Resource Strain: Low  Risk    Difficulty of Paying Living Expenses: Not hard at all  Food Insecurity: No Food Insecurity   Worried About Charity fundraiser in the Last Year: Never true   [place] in the Last Year: Never true  Transportation Needs: No Transportation Needs   Lack of Transportation (Medical): No   Lack of Transportation (Non-Medical): No  Physical Activity: Not on file  Stress: No Stress Concern Present   Feeling of Stress : Not at all  Social Connections: Unknown   Frequency of Communication with Friends and Family: Not on file   Frequency of Social Gatherings with Friends and Family: Not on file   Attends Religious Services: More than 4 times per year   Active Member of [employer] or Organizations: Yes   Attends Archivist Meetings: Not on file   Marital Status: Not on file  Intimate Partner Violence: Not At Risk   Fear of Current or Ex-Partner: No   Emotionally Abused: No   Physically Abused: No   Sexually Abused: No    Family History  Problem Relation Age of Onset   Cancer Mother        bilatera, contralateral recurred after 20 yrs   Stroke Father    Heart disease Father 34       died of stroke  at 24, tobacco user   Cancer Father        colon Ca   Cancer Paternal Grandmother 60       metastatic colon Ca   Cancer Maternal Uncle        breast x 2 aunts     Current Outpatient Medications:    Acetaminophen (TYLENOL ARTHRITIS PAIN PO), Take by mouth., Disp: , Rfl:    atorvastatin (LIPITOR) 20 MG tablet, Take 1 tablet (20 mg total) by mouth daily., Disp: 90 tablet, Rfl: 3   buPROPion (WELLBUTRIN XL) 150 MG 24 hr tablet, TAKE 1 TABLET BY MOUTH EVERY DAY, Disp: 90 tablet, Rfl: 1   Cholecalciferol (VITAMIN D3 PO), Take by mouth every evening., Disp: , Rfl:    ELIQUIS 5 MG TABS tablet, TAKE 1 TABLET BY MOUTH TWICE A DAY, Disp: 60 tablet, Rfl: 1   esomeprazole (NEXIUM) 20 MG capsule, Take 20 mg by mouth daily as needed., Disp: , Rfl:    fish oil-omega-3 fatty acids 1000  MG capsule, Take 2 g by mouth every evening., Disp: , Rfl:    losartan (COZAAR) 25 MG tablet, TAKE 1 TABLET (25 MG TOTAL) BY MOUTH DAILY., Disp: 90 tablet, Rfl: 1   metoprolol succinate (TOPROL-XL) 50 MG 24 hr tablet, TAKE 1 TABLET BY MOUTH EVERY DAY, Disp: 90 tablet, Rfl: 1   Multiple Vitamin (MULTIVITAMIN) tablet, Take 1 tablet by mouth every evening., Disp: , Rfl:    PREMARIN vaginal cream, USE 1 TO 2 GRAMS INTRAVAGINALLY TWICE WEEKLY, Disp: 30 g, Rfl: 2  Physical exam:  Vitals:   06/04/21 1001  BP: (!) 155/85  Pulse: 74  Resp: 16  Temp: 98.2 F (36.8 C)  SpO2: 98%  Weight: 224 lb (101.6 kg)   Physical Exam Constitutional:  General: She is not in acute distress. Cardiovascular:     Rate and Rhythm: Normal rate and regular rhythm.     Heart sounds: Normal heart sounds.  Pulmonary:     Effort: Pulmonary effort is normal.  Skin:    General: Skin is warm and dry.  Neurological:     Mental Status: She is alert and oriented to person, place, and time.        Latest Ref Rng & Units 06/04/2021    9:26 AM  CMP  Glucose 70 - 99 mg/dL 108    BUN 8 - 23 mg/dL 27    Creatinine 0.44 - 1.00 mg/dL 1.27    Sodium 135 - 145 mmol/L 140    Potassium 3.5 - 5.1 mmol/L 4.0    Chloride 98 - 111 mmol/L 104    CO2 22 - 32 mmol/L 27    Calcium 8.9 - 10.3 mg/dL 9.6    Total Protein 6.5 - 8.1 g/dL 7.5    Total Bilirubin 0.3 - 1.2 mg/dL 0.5    Alkaline Phos 38 - 126 U/L 60    AST 15 - 41 U/L 24    ALT 0 - 44 U/L 25        Latest Ref Rng & Units 06/04/2021    9:26 AM  CBC  WBC 4.0 - 10.5 K/uL 10.2    Hemoglobin 12.0 - 15.0 g/dL 14.1    Hematocrit 36.0 - 46.0 % 41.8    Platelets 150 - 400 K/uL 216      Assessment and plan- Patient is a 71 y.o. female with history of bilateral pulmonary embolism on Eliquis here for routine follow-up  Patient had an unprovoked bilateral pulmonary embolism in 2022.  It would be reasonable for her to remain on lifelong Eliquis unless there is any  concern for ongoing bleeding issues.  Since it has been more than 6 months from her time of diagnosis if she were to undergo any elective procedures in the future she can come off Eliquis for 2 to 3 days prior to procedure and go back on Eliquis postprocedure without any bridging.  Patient is not required any follow-up with hematology at this time but can be referred to [place] in the future if questions or concerns arise   Visit Diagnosis 1. Bilateral pulmonary embolism (HCC)      Dr. Randa Evens, MD, MPH [facility] at [facility] [phone] 06/04/2021 3:51 PM

## 2021-06-18 ENCOUNTER — Ambulatory Visit: Payer: Medicare PPO | Admitting: Internal Medicine

## 2021-06-18 ENCOUNTER — Encounter: Payer: Self-pay | Admitting: Internal Medicine

## 2021-06-18 VITALS — BP 150/72 | HR 95 | Temp 98.2°F | Ht 63.0 in | Wt 226.0 lb

## 2021-06-18 DIAGNOSIS — Z6841 Body Mass Index (BMI) 40.0 and over, adult: Secondary | ICD-10-CM | POA: Diagnosis not present

## 2021-06-18 DIAGNOSIS — R7301 Impaired fasting glucose: Secondary | ICD-10-CM | POA: Diagnosis not present

## 2021-06-18 DIAGNOSIS — I700 Atherosclerosis of aorta: Secondary | ICD-10-CM

## 2021-06-18 DIAGNOSIS — F418 Other specified anxiety disorders: Secondary | ICD-10-CM

## 2021-06-18 DIAGNOSIS — I10 Essential (primary) hypertension: Secondary | ICD-10-CM | POA: Diagnosis not present

## 2021-06-18 DIAGNOSIS — E7800 Pure hypercholesterolemia, unspecified: Secondary | ICD-10-CM | POA: Diagnosis not present

## 2021-06-18 DIAGNOSIS — F324 Major depressive disorder, single episode, in partial remission: Secondary | ICD-10-CM | POA: Diagnosis not present

## 2021-06-18 DIAGNOSIS — Z86711 Personal history of pulmonary embolism: Secondary | ICD-10-CM

## 2021-06-18 LAB — LDL CHOLESTEROL, DIRECT: Direct LDL: 81 mg/dL

## 2021-06-18 LAB — COMPREHENSIVE METABOLIC PANEL
ALT: 20 U/L (ref 0–35)
AST: 19 U/L (ref 0–37)
Albumin: 4.1 g/dL (ref 3.5–5.2)
Alkaline Phosphatase: 54 U/L (ref 39–117)
BUN: 23 mg/dL (ref 6–23)
CO2: 28 mEq/L (ref 19–32)
Calcium: 9.7 mg/dL (ref 8.4–10.5)
Chloride: 104 mEq/L (ref 96–112)
Creatinine, Ser: 1.05 mg/dL (ref 0.40–1.20)
GFR: 54.11 mL/min — ABNORMAL LOW (ref >60.00)
Glucose, Bld: 95 mg/dL (ref 70–99)
Potassium: 3.6 mEq/L (ref 3.5–5.1)
Sodium: 139 mEq/L (ref 135–145)
Total Bilirubin: 0.7 mg/dL (ref 0.2–1.2)
Total Protein: 6.9 g/dL (ref 6.0–8.3)

## 2021-06-18 LAB — HEMOGLOBIN A1C: Hgb A1c MFr Bld: 5.7 % (ref 4.6–6.5)

## 2021-06-18 LAB — LIPID PANEL
Cholesterol: 159 mg/dL (ref 0–200)
HDL: 71.4 mg/dL (ref >39.00)
LDL Cholesterol: 80 mg/dL (ref 0–99)
NonHDL: 87.94
Total CHOL/HDL Ratio: 2
Triglycerides: 41 mg/dL (ref 0.0–149.0)
VLDL: 8.2 mg/dL (ref 0.0–40.0)

## 2021-06-18 LAB — MICROALBUMIN / CREATININE URINE RATIO
Creatinine,U: 81.2 mg/dL
Microalb Creat Ratio: 0.9 mg/g (ref 0.0–30.0)
Microalb, Ur: <0.7 mg/dL (ref 0.0–1.9)

## 2021-06-18 LAB — TSH: TSH: 0.6 u[IU]/mL (ref 0.35–5.50)

## 2021-06-18 MED ORDER — APIXABAN 5 MG PO TABS
5.0000 mg | ORAL_TABLET | Freq: Two times a day (BID) | ORAL | 5 refills | Status: DC
Start: 2021-06-18 — End: 2021-12-13

## 2021-06-18 MED ORDER — SEMAGLUTIDE-WEIGHT MANAGEMENT 0.25 MG/0.5ML ~~LOC~~ SOAJ: 0.2500 mg | SUBCUTANEOUS | 0 refills | Status: DC

## 2021-06-18 NOTE — Assessment & Plan Note (Signed)
Considered unprovoked and life threatening,  So lifelong anticoagulation is recommended per hematology.  sSince it has been more than 6 months from her time of diagnosis if she were to undergo any elective procedures in the future she can come off Eliquis for 2 to 3 days prior to procedure and go back on Eliquis postprocedure without any bridging.

## 2021-06-18 NOTE — Patient Instructions (Addendum)
I agree with  a trial of an injectable medication to help curb your appetite,  It is called semiglutide (ozempic/wegovy) IF IT IS COVERED BY YOUR INSURANCE    It slows down your stomach emptying so you stay full longer . once you pick up the medication,  Schedule  an RN visit so we can show you  To  inject it   If your insurance will not pay for it,  we can discuss other options, like metformin or Benefiber before each meal  Increase losartan to 50  mg and follow up with measurements after one week   goal is 130/80

## 2021-06-18 NOTE — Progress Notes (Signed)
Subjective:  Patient ID: Michele Garza, female    DOB: 08-07-1950  Age: 71 y.o. MRN: [id number]  CC: The primary encounter diagnosis was Primary hypertension. Diagnoses of Pure hypercholesterolemia, Impaired fasting glucose, Morbid obesity with BMI of 40.0-44.9, adult ([place]), History of pulmonary embolus (PE), Anxiety about health, Aortic atherosclerosis ([place]), and Major depressive disorder with single episode, in partial remission [facility]) were also pertinent to this visit.   HPI Michele Garza presents for  Chief Complaint  Patient presents with   Follow-up    6 month follow up     Obesity:  since her mother passed away 6 months ago, she has been redirecting her energies and efforts to improving her own health and is motivated to lose weight using pharmacotherapy She has history of substernal goiter,  managed with serial scans by Dr.  Ihor Austin at [facility],  but no personal or family history of thyroid  CA. Next appt is with him in July    History of massive PE:  with no evidence of DVT bilaterally.  She is taking bid Eliquis ,  last hematology visit May 2023.   6 month follow up CT was done in January. And PE had resolved,  but she has been advised to continue lifelong anticoagulation due to life threatening PE   Outpatient Medications Prior to Visit  Medication Sig Dispense Refill   Acetaminophen (TYLENOL ARTHRITIS PAIN PO) Take by mouth.     atorvastatin (LIPITOR) 20 MG tablet Take 1 tablet (20 mg total) by mouth daily. 90 tablet 3   buPROPion (WELLBUTRIN XL) 150 MG 24 hr tablet TAKE 1 TABLET BY MOUTH EVERY DAY 90 tablet 1   Cholecalciferol (VITAMIN D3 PO) Take by mouth every evening.     esomeprazole (NEXIUM) 20 MG capsule Take 20 mg by mouth daily as needed.     fish oil-omega-3 fatty acids 1000 MG capsule Take 2 g by mouth every evening.     losartan (COZAAR) 25 MG tablet TAKE 1 TABLET (25 MG TOTAL) BY MOUTH DAILY. 90 tablet 1   metoprolol succinate (TOPROL-XL) 50 MG 24 hr tablet TAKE 1  TABLET BY MOUTH EVERY DAY 90 tablet 1   Multiple Vitamin (MULTIVITAMIN) tablet Take 1 tablet by mouth every evening.     PREMARIN vaginal cream USE 1 TO 2 GRAMS INTRAVAGINALLY TWICE WEEKLY 30 g 2   ELIQUIS 5 MG TABS tablet TAKE 1 TABLET BY MOUTH TWICE A DAY 60 tablet 1   No facility-administered medications prior to visit.    Review of Systems;  Patient denies headache, fevers, malaise, unintentional weight loss, skin rash, eye pain, sinus congestion and sinus pain, sore throat, dysphagia,  hemoptysis , cough, dyspnea, wheezing, chest pain, palpitations, orthopnea, edema, abdominal pain, nausea, melena, diarrhea, constipation, flank pain, dysuria, hematuria, urinary  Frequency, nocturia, numbness, tingling, seizures,  Focal weakness, Loss of consciousness,  Tremor, insomnia, depression, anxiety, and suicidal ideation.      Objective:  BP (!) 150/72 (BP Location: Left Arm, Patient Position: Sitting, Cuff Size: Large)   Pulse 95   Temp 98.2 F (36.8 C) (Oral)   Ht '5\' 3"'$  (1.6 m)   Wt 226 lb (102.5 kg)   SpO2 96%   BMI 40.03 kg/m   BP Readings from Last 3 Encounters:  06/18/21 (!) 150/72  06/04/21 (!) 155/85  11/30/20 126/88    Wt Readings from Last 3 Encounters:  06/18/21 226 lb (102.5 kg)  06/04/21 224 lb (101.6 kg)  11/30/20 211 lb 6.4 oz (  95.9 kg)    General appearance: alert, cooperative and appears stated age Ears: normal TM's and external ear canals both ears Throat: lips, mucosa, and tongue normal; teeth and gums normal Neck: no adenopathy, no carotid bruit, supple, symmetrical, trachea midline and thyroid not enlarged, symmetric, no tenderness/mass/nodules Back: symmetric, no curvature. ROM normal. No CVA tenderness. Lungs: clear to auscultation bilaterally Heart: regular rate and rhythm, S1, S2 normal, no murmur, click, rub or gallop Abdomen: soft, non-tender; bowel sounds normal; no masses,  no organomegaly Pulses: 2+ and symmetric Skin: Skin color, texture,  turgor normal. No rashes or lesions Lymph nodes: Cervical, supraclavicular, and axillary nodes normal.  Lab Results  Component Value Date   HGBA1C 5.7 06/18/2021   HGBA1C 5.9 11/30/2020   HGBA1C 5.7 10/17/2019    Lab Results  Component Value Date   CREATININE 1.05 06/18/2021   CREATININE 1.27 (H) 06/04/2021   CREATININE 1.00 01/20/2021    Lab Results  Component Value Date   WBC 10.2 06/04/2021   HGB 14.1 06/04/2021   HCT 41.8 06/04/2021   PLT 216 06/04/2021   GLUCOSE 95 06/18/2021   CHOL 159 06/18/2021   TRIG 41.0 06/18/2021   HDL 71.40 06/18/2021   LDLDIRECT 81.0 06/18/2021   LDLCALC 80 06/18/2021   ALT 20 06/18/2021   AST 19 06/18/2021   NA 139 06/18/2021   K 3.6 06/18/2021   CL 104 06/18/2021   CREATININE 1.05 06/18/2021   BUN 23 06/18/2021   CO2 28 06/18/2021   TSH 0.60 06/18/2021   INR 1.1 03/16/2020   HGBA1C 5.7 06/18/2021   MICROALBUR <0.7 06/18/2021    CT Angio Chest W/Cm &/Or Wo Cm  Result Date: 01/20/2021 CLINICAL DATA:  History of pulmonary embolism treated with thrombectomy. Currently on anticoagulation. Evaluate for acute or chronic pulmonary embolism. EXAM: CT ANGIOGRAPHY CHEST WITH CONTRAST TECHNIQUE: Multidetector CT imaging of the chest was performed using the standard protocol during bolus administration of intravenous contrast. Multiplanar CT image reconstructions and MIPs were obtained to evaluate the vascular anatomy. CONTRAST:  59m OMNIPAQUE IOHEXOL 350 MG/ML SOLN COMPARISON:  Chest CTA-03/16/2020 Thyroid ultrasound-03/16/2020 FINDINGS: Vascular Findings: There is adequate opacification of the pulmonary arterial system with the main pulmonary artery measuring 300 Hounsfield units. There are no discrete filling defects within the pulmonary arterial tree to suggest acute or chronic pulmonary embolism. Normal caliber of the main pulmonary artery. Cardiomegaly. No pericardial effusion. Coronary artery calcifications. Scattered atherosclerotic plaque  within a normal caliber thoracic aorta. Conventional configuration of the aortic arch. The descending thoracic aorta is mildly tortuous but of normal caliber. Review of the MIP images confirms the above findings. ---------------------------------------------------------------------------------- Nonvascular Findings: Mediastinum/Lymph Nodes: No bulky mediastinal, hilar or axillary lymphadenopathy. Lungs/Pleura: Minimal dependent subpleural ground-glass atelectasis. No discrete focal airspace opacities. No pleural effusion or pneumothorax. The central pulmonary airways appear widely patent. No discrete pulmonary nodules. Upper abdomen: Limited early arterial phase evaluation of the upper abdomen suggests a small hiatal hernia. Scattered colonic diverticulosis without evidence of superimposed acute diverticulitis. Previously noted trace perihepatic ascites and stranding at the level of the porta hepatis has apparently resolved in the interval. Musculoskeletal: No acute or aggressive osseous abnormalities. 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 Asymmetric enlargement of the left lobe of the thyroid with scattered dystrophic calcifications and mass effect upon the tracheal air column at the level of the thoracic inlet (representative image 18, series 4), similar to chest CT performed 03/16/2020 as well as further evaluated on  dedicated thyroid ultrasound also performed on 03/16/2020. IMPRESSION: 1. No evidence of acute or chronic pulmonary embolism. 2. Cardiomegaly. 3. Coronary artery calcifications. Aortic Atherosclerosis (ICD10-I70.0). 4. Redemonstrated asymmetric enlargement of the left lobe of the thyroid as demonstrated on both previous chest CT and dedicated thyroid ultrasound performed 03/16/2020. If recommended ultrasound-guided fine-needle aspiration is not been performed, repeat diagnostic thyroid ultrasound is recommended. Electronically Signed   By: Sandi Mariscal M.D.   On:  01/20/2021 09:37    Assessment & Plan:   Problem List Items Addressed This Visit     Anxiety about health    Reassurance provided.  Her anxiety has largely resolved .  She is using alprazolam very rarely.  The risks and benefits of benzodiazepine use were discussed with patient today including excessive sedation leading to respiratory depression,  impaired thinking/driving, and addiction.  Patient was advised to avoid concurrent use with alcohol, to use medication only as needed and not to share with others  .        Aortic atherosclerosis ([place])    .Marland Kitchen  Patient is tolerating high potency statin therapy        Relevant Medications   apixaban (ELIQUIS) 5 MG TABS tablet   History of pulmonary embolus (PE)    Considered unprovoked and life threatening,  So lifelong anticoagulation is recommended per hematology.  sSince it has been more than 6 months from her time of diagnosis if she were to undergo any elective procedures in the future she can come off Eliquis for 2 to 3 days prior to procedure and go back on Eliquis postprocedure without any bridging.       Hyperlipidemia   Relevant Medications   apixaban (ELIQUIS) 5 MG TABS tablet   Other Relevant Orders   Direct LDL (Completed)   Hemoglobin A1c (Completed)   Lipid panel (Completed)   Hypertension - Primary   Relevant Medications   apixaban (ELIQUIS) 5 MG TABS tablet   Other Relevant Orders   Urine Microalbumin w/creat. ratio (Completed)   Major depressive disorder in partial remission [facility])    She notes improved symptoms despite death of her mother .  She prefers to continue wellbutrin         Morbid obesity with BMI of 40.0-44.9, adult [facility])    Discussed use of GLP 1 agonist for appetite suppression.  She has no contraindications/        Relevant Medications   Semaglutide-Weight Management 0.25 MG/0.5ML SOAJ   Other Relevant Orders   TSH (Completed)   Other Visit Diagnoses     Impaired fasting glucose        Relevant Orders   Hemoglobin A1c (Completed)   Comprehensive metabolic panel (Completed)       I spent a total of  38 minutes with this patient in a face to face visit on the date of this encounter reviewing the last office visit with me ,  her most recent visit with her hematologist , patient's diet and eating habits, home blood pressure readings ,  most recent imaging study ,   and post visit ordering of testing and therapeutics.    Follow-up: Return in about 3 months (around 09/18/2021).   Crecencio Mc, MD

## 2021-06-20 NOTE — Assessment & Plan Note (Signed)
Reassurance provided.  Her anxiety has largely resolved .  She is using alprazolam very rarely.  The risks and benefits of benzodiazepine use were discussed with patient today including excessive sedation leading to respiratory depression,  impaired thinking/driving, and addiction.  Patient was advised to avoid concurrent use with alcohol, to use medication only as needed and not to share with others  .

## 2021-06-20 NOTE — Assessment & Plan Note (Signed)
She notes improved symptoms despite death of her mother .  She prefers to continue wellbutrin

## 2021-06-20 NOTE — Assessment & Plan Note (Signed)
Discussed use of GLP 1 agonist for appetite suppression.  She has no contraindications/

## 2021-06-20 NOTE — Assessment & Plan Note (Signed)
..    Patient is tolerating high potency statin therapy

## 2021-06-21 ENCOUNTER — Encounter: Payer: Self-pay | Admitting: Internal Medicine

## 2021-06-22 ENCOUNTER — Other Ambulatory Visit: Payer: Self-pay

## 2021-06-22 MED ORDER — LOSARTAN POTASSIUM 50 MG PO TABS: 50.0000 mg | ORAL_TABLET | Freq: Every day | ORAL | 1 refills | Status: DC

## 2021-06-22 NOTE — Addendum Note (Signed)
Addended by: Crecencio Mc on: 06/22/2021 08:10 AM   Modules accepted: Orders

## 2021-06-22 NOTE — Assessment & Plan Note (Signed)
she reports compliance with medication regimen  but has had several elevated reading during office visits. e is not using NSAIDs daily.  Discussed goal of 120/70  (130/80 for patients over 70)  to preserve renal function.  She has been asked to incresae losartan dose to 50 mg daily check her  BP  at home and  submit readings for evaluation. Renal function, electrolytes and screen for proteinuria are all normal .  Lab Results  Component Value Date   CREATININE 1.05 06/18/2021   Lab Results  Component Value Date   NA 139 06/18/2021   K 3.6 06/18/2021   CL 104 06/18/2021   CO2 28 06/18/2021

## 2021-07-06 DIAGNOSIS — Z1231 Encounter for screening mammogram for malignant neoplasm of breast: Secondary | ICD-10-CM | POA: Diagnosis not present

## 2021-07-23 ENCOUNTER — Encounter: Payer: Self-pay | Admitting: Internal Medicine

## 2021-07-29 ENCOUNTER — Other Ambulatory Visit: Payer: Self-pay

## 2021-07-29 MED ORDER — SEMAGLUTIDE-WEIGHT MANAGEMENT 0.25 MG/0.5ML ~~LOC~~ SOAJ: 0.2500 mg | SUBCUTANEOUS | 0 refills | Status: DC

## 2021-08-07 ENCOUNTER — Other Ambulatory Visit: Payer: Self-pay | Admitting: Internal Medicine

## 2021-08-13 DIAGNOSIS — Z6839 Body mass index (BMI) 39.0-39.9, adult: Secondary | ICD-10-CM | POA: Diagnosis not present

## 2021-08-13 DIAGNOSIS — E049 Nontoxic goiter, unspecified: Secondary | ICD-10-CM | POA: Diagnosis not present

## 2021-08-24 MED ORDER — WEGOVY 0.5 MG/0.5ML ~~LOC~~ SOAJ: 0.5000 mg | SUBCUTANEOUS | 2 refills | Status: DC

## 2021-08-25 MED ORDER — SEMAGLUTIDE-WEIGHT MANAGEMENT 0.25 MG/0.5ML ~~LOC~~ SOAJ: 0.2500 mg | SUBCUTANEOUS | 2 refills | Status: AC

## 2021-08-25 NOTE — Addendum Note (Signed)
Addended by: Crecencio Mc on: 08/25/2021 04:59 PM   Modules accepted: Orders

## 2021-08-27 DIAGNOSIS — E041 Nontoxic single thyroid nodule: Secondary | ICD-10-CM | POA: Diagnosis not present

## 2021-08-27 DIAGNOSIS — E049 Nontoxic goiter, unspecified: Secondary | ICD-10-CM | POA: Diagnosis not present

## 2021-09-15 DIAGNOSIS — M170 Bilateral primary osteoarthritis of knee: Secondary | ICD-10-CM | POA: Diagnosis not present

## 2021-09-23 ENCOUNTER — Ambulatory Visit: Payer: Medicare PPO | Admitting: Internal Medicine

## 2021-09-23 ENCOUNTER — Encounter: Payer: Self-pay | Admitting: Internal Medicine

## 2021-09-23 VITALS — BP 130/86 | HR 94 | Temp 98.0°F | Ht 63.0 in | Wt 216.2 lb

## 2021-09-23 DIAGNOSIS — R7989 Other specified abnormal findings of blood chemistry: Secondary | ICD-10-CM | POA: Diagnosis not present

## 2021-09-23 DIAGNOSIS — Z6841 Body Mass Index (BMI) 40.0 and over, adult: Secondary | ICD-10-CM | POA: Diagnosis not present

## 2021-09-23 DIAGNOSIS — I10 Essential (primary) hypertension: Secondary | ICD-10-CM | POA: Diagnosis not present

## 2021-09-23 DIAGNOSIS — H259 Unspecified age-related cataract: Secondary | ICD-10-CM | POA: Diagnosis not present

## 2021-09-23 DIAGNOSIS — E049 Nontoxic goiter, unspecified: Secondary | ICD-10-CM | POA: Diagnosis not present

## 2021-09-23 MED ORDER — SEMAGLUTIDE-WEIGHT MANAGEMENT 1.7 MG/0.75ML ~~LOC~~ SOAJ: 1.7000 mg | SUBCUTANEOUS | 2 refills | Status: DC

## 2021-09-23 MED ORDER — SEMAGLUTIDE-WEIGHT MANAGEMENT 1 MG/0.5ML ~~LOC~~ SOAJ: 1.0000 mg | SUBCUTANEOUS | 2 refills | Status: DC

## 2021-09-23 MED ORDER — SEMAGLUTIDE-WEIGHT MANAGEMENT 2.4 MG/0.75ML ~~LOC~~ SOAJ: 2.4000 mg | SUBCUTANEOUS | 0 refills | Status: DC

## 2021-09-23 NOTE — Progress Notes (Unsigned)
Subjective:  Patient ID: Michele Garza, female    DOB: 03/09/1950  Age: 71 y.o. MRN: [id number]  CC: There were no encounter diagnoses.   HPI Michele Garza presents for FOLLOW UP ON pharmacotherapy for obesity  Chief Complaint  Patient presents with   Follow-up    3 month follow up    1) obesity:  taking wegovy, started on July 18 due to supply issues,   0.25 mg weekly dose,  has lost 10 lbs since . Getting ready to start the 0.5 mg dose ( received it last week)    2) BILATERAL CATARACTS  3)    Outpatient Medications Prior to Visit  Medication Sig Dispense Refill   Acetaminophen (TYLENOL ARTHRITIS PAIN PO) Take by mouth.     apixaban (ELIQUIS) 5 MG TABS tablet Take 1 tablet (5 mg total) by mouth 2 (two) times daily. 60 tablet 5   atorvastatin (LIPITOR) 20 MG tablet TAKE 1 TABLET BY MOUTH EVERY DAY 90 tablet 3   buPROPion (WELLBUTRIN XL) 150 MG 24 hr tablet TAKE 1 TABLET BY MOUTH EVERY DAY 90 tablet 1   Cholecalciferol (VITAMIN D3 PO) Take by mouth every evening.     esomeprazole (NEXIUM) 20 MG capsule Take 20 mg by mouth daily as needed.     fish oil-omega-3 fatty acids 1000 MG capsule Take 2 g by mouth every evening.     losartan (COZAAR) 50 MG tablet Take 1 tablet (50 mg total) by mouth daily. 90 tablet 1   metoprolol succinate (TOPROL-XL) 50 MG 24 hr tablet TAKE 1 TABLET BY MOUTH EVERY DAY 90 tablet 1   Multiple Vitamin (MULTIVITAMIN) tablet Take 1 tablet by mouth every evening.     PREMARIN vaginal cream USE 1 TO 2 GRAMS INTRAVAGINALLY TWICE WEEKLY 30 g 2   WEGOVY 0.5 MG/0.5ML SOAJ Inject 0.5 mg into the skin once a week.     No facility-administered medications prior to visit.    Review of Systems;  Patient denies headache, fevers, malaise, unintentional weight loss, skin rash, eye pain, sinus congestion and sinus pain, sore throat, dysphagia,  hemoptysis , cough, dyspnea, wheezing, chest pain, palpitations, orthopnea, edema, abdominal pain, nausea, melena, diarrhea,  constipation, flank pain, dysuria, hematuria, urinary  Frequency, nocturia, numbness, tingling, seizures,  Focal weakness, Loss of consciousness,  Tremor, insomnia, depression, anxiety, and suicidal ideation.      Objective:  BP 130/86 (BP Location: Left Arm, Patient Position: Sitting, Cuff Size: Large)   Pulse 94   Temp 98 F (36.7 C) (Oral)   Ht '5\' 3"'$  (1.6 m)   Wt 216 lb 3.2 oz (98.1 kg)   SpO2 96%   BMI 38.30 kg/m   BP Readings from Last 3 Encounters:  09/23/21 130/86  06/18/21 (!) 150/72  06/04/21 (!) 155/85    Wt Readings from Last 3 Encounters:  09/23/21 216 lb 3.2 oz (98.1 kg)  06/18/21 226 lb (102.5 kg)  06/04/21 224 lb (101.6 kg)    General appearance: alert, cooperative and appears stated age Ears: normal TM's and external ear canals both ears Throat: lips, mucosa, and tongue normal; teeth and gums normal Neck: no adenopathy, no carotid bruit, supple, symmetrical, trachea midline and thyroid not enlarged, symmetric, no tenderness/mass/nodules Back: symmetric, no curvature. ROM normal. No CVA tenderness. Lungs: clear to auscultation bilaterally Heart: regular rate and rhythm, S1, S2 normal, no murmur, click, rub or gallop Abdomen: soft, non-tender; bowel sounds normal; no masses,  no organomegaly Pulses: 2+ and symmetric Skin: Skin  color, texture, turgor normal. No rashes or lesions Lymph nodes: Cervical, supraclavicular, and axillary nodes normal. Neuro:  awake and interactive with normal mood and affect. Higher cortical functions are normal. Speech is clear without word-finding difficulty or dysarthria. Extraocular movements are intact. Visual fields of both eyes are grossly intact. Sensation to light touch is grossly intact bilaterally of upper and lower extremities. Motor examination shows 4+/5 symmetric hand grip and upper extremity and 5/5 lower extremity strength. There is no pronation or drift. Gait is non-ataxic   Lab Results  Component Value Date   HGBA1C  5.7 06/18/2021   HGBA1C 5.9 11/30/2020   HGBA1C 5.7 10/17/2019    Lab Results  Component Value Date   CREATININE 1.05 06/18/2021   CREATININE 1.27 (H) 06/04/2021   CREATININE 1.00 01/20/2021    Lab Results  Component Value Date   WBC 10.2 06/04/2021   HGB 14.1 06/04/2021   HCT 41.8 06/04/2021   PLT 216 06/04/2021   GLUCOSE 95 06/18/2021   CHOL 159 06/18/2021   TRIG 41.0 06/18/2021   HDL 71.40 06/18/2021   LDLDIRECT 81.0 06/18/2021   LDLCALC 80 06/18/2021   ALT 20 06/18/2021   AST 19 06/18/2021   NA 139 06/18/2021   K 3.6 06/18/2021   CL 104 06/18/2021   CREATININE 1.05 06/18/2021   BUN 23 06/18/2021   CO2 28 06/18/2021   TSH 0.60 06/18/2021   INR 1.1 03/16/2020   HGBA1C 5.7 06/18/2021   MICROALBUR <0.7 06/18/2021    CT Angio Chest W/Cm &/Or Wo Cm  Result Date: 01/20/2021 CLINICAL DATA:  History of pulmonary embolism treated with thrombectomy. Currently on anticoagulation. Evaluate for acute or chronic pulmonary embolism. EXAM: CT ANGIOGRAPHY CHEST WITH CONTRAST TECHNIQUE: Multidetector CT imaging of the chest was performed using the standard protocol during bolus administration of intravenous contrast. Multiplanar CT image reconstructions and MIPs were obtained to evaluate the vascular anatomy. CONTRAST:  77m OMNIPAQUE IOHEXOL 350 MG/ML SOLN COMPARISON:  Chest CTA-03/16/2020 Thyroid ultrasound-03/16/2020 FINDINGS: Vascular Findings: There is adequate opacification of the pulmonary arterial system with the main pulmonary artery measuring 300 Hounsfield units. There are no discrete filling defects within the pulmonary arterial tree to suggest acute or chronic pulmonary embolism. Normal caliber of the main pulmonary artery. Cardiomegaly. No pericardial effusion. Coronary artery calcifications. Scattered atherosclerotic plaque within a normal caliber thoracic aorta. Conventional configuration of the aortic arch. The descending thoracic aorta is mildly tortuous but of normal  caliber. Review of the MIP images confirms the above findings. ---------------------------------------------------------------------------------- Nonvascular Findings: Mediastinum/Lymph Nodes: No bulky mediastinal, hilar or axillary lymphadenopathy. Lungs/Pleura: Minimal dependent subpleural ground-glass atelectasis. No discrete focal airspace opacities. No pleural effusion or pneumothorax. The central pulmonary airways appear widely patent. No discrete pulmonary nodules. Upper abdomen: Limited early arterial phase evaluation of the upper abdomen suggests a small hiatal hernia. Scattered colonic diverticulosis without evidence of superimposed acute diverticulitis. Previously noted trace perihepatic ascites and stranding at the level of the porta hepatis has apparently resolved in the interval. Musculoskeletal: No acute or aggressive osseous abnormalities. 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 Asymmetric enlargement of the left lobe of the thyroid with scattered dystrophic calcifications and mass effect upon the tracheal air column at the level of the thoracic inlet (representative image 18, series 4), similar to chest CT performed 03/16/2020 as well as further evaluated on dedicated thyroid ultrasound also performed on 03/16/2020. IMPRESSION: 1. No evidence of acute or chronic pulmonary embolism. 2. Cardiomegaly. 3. Coronary  artery calcifications. Aortic Atherosclerosis (ICD10-I70.0). 4. Redemonstrated asymmetric enlargement of the left lobe of the thyroid as demonstrated on both previous chest CT and dedicated thyroid ultrasound performed 03/16/2020. If recommended ultrasound-guided fine-needle aspiration is not been performed, repeat diagnostic thyroid ultrasound is recommended. Electronically Signed   By: Sandi Mariscal M.D.   On: 01/20/2021 09:37    Assessment & Plan:   Problem List Items Addressed This Visit   None   I spent a total of   minutes with this patient in a face  to face visit on the date of this encounter reviewing the last office visit with me in       ,  most recent visit with cardiology ,    ,  patient's diet and exercise habits, home blood pressure /blod sugar readings, recent ER visit including labs and imaging studies ,   and post visit ordering of testing and therapeutics.    Follow-up: No follow-ups on file.   Crecencio Mc, MD

## 2021-09-23 NOTE — Patient Instructions (Addendum)
I'M GLAD THE WEGOVY IS WORKING FOR YOU!  DON'T FORGET TO WORK YOUR WAY UP TO 30 MINUTES OF CARDIO DAILY   THE HIGHER DOSES OF WEGOVY ARE ON FILE AT YOUR PHARMACY  QUAD STRENGTHENING EXERCISES SHOULD BE DONE SLOWLY AND WITH VERY LITTLE WEIGHT TO ENSURE THAT YOU ARE RECRUITING ALL MUSCLE FIBERS AND NOT USING MOMENTUM TO RAISE YOUR LEG    LET'S REPEAT YOUR LABS IN DECEMBER.  (OFFICE VISIT TOO,  BUT OPTIONAL)

## 2021-09-26 NOTE — Assessment & Plan Note (Signed)
  Resolved by recent check.   Lab Results  Component Value Date   ALT 20 06/18/2021   AST 19 06/18/2021   ALKPHOS 54 06/18/2021   BILITOT 0.7 06/18/2021

## 2021-09-26 NOTE — Assessment & Plan Note (Signed)
Losing weight with appetite suppression medication Wegovy  Increased dose to 0.5 mg today .  Engagement in regular exercise strongly advised.

## 2021-09-26 NOTE — Assessment & Plan Note (Signed)
Well controlled on current regimen of losartan and metoprolol. Renal function stable, no changes today.  Lab Results  Component Value Date   CREATININE 1.05 06/18/2021   Lab Results  Component Value Date   NA 139 06/18/2021   K 3.6 06/18/2021   CL 104 06/18/2021   CO2 28 06/18/2021

## 2021-09-26 NOTE — Assessment & Plan Note (Addendum)
Repeat scan was done in  December by [facility] endocrinologist . And CT was one in August showing  Large heterogeneous nodule within the left lobe of the thyroid/isthmus measuring up to 6.5 cm, overall similar to prior allowing for differences in imaging technique/modality. There is mild mass effect on the trachea. she remains asymptomatic. FNA of thyroid nodules done in April 2022  by ENT  Was benign, and current thyroid function is normal.  Lab Results  Component Value Date   TSH 0.60 06/18/2021

## 2021-09-30 ENCOUNTER — Other Ambulatory Visit: Payer: Self-pay | Admitting: Internal Medicine

## 2021-10-01 ENCOUNTER — Ambulatory Visit (INDEPENDENT_AMBULATORY_CARE_PROVIDER_SITE_OTHER): Payer: Medicare PPO

## 2021-10-01 VITALS — Ht 63.0 in | Wt 216.0 lb

## 2021-10-01 DIAGNOSIS — Z0000 Encounter for general adult medical examination without abnormal findings: Secondary | ICD-10-CM | POA: Diagnosis not present

## 2021-10-01 NOTE — Progress Notes (Cosign Needed Addendum)
Subjective:   Michele Garza is a 71 y.o. female who presents for Medicare Annual (Subsequent) preventive examination.  Review of Systems    No ROS.  Medicare Wellness Virtual Visit.  Visual/audio telehealth visit, UTA vital signs.   See social history for additional risk factors.   Cardiac Risk Factors include: advanced age (>68mn, >>2women);hypertension     Objective:    Today's Vitals   10/01/21 0918  Weight: 216 lb (98 kg)  Height: '5\' 3"'$  (1.6 m)   Body mass index is 38.26 kg/m.     10/01/2021    9:29 AM 06/04/2021    9:38 AM 09/30/2020   11:38 AM 07/07/2020    3:01 PM 03/23/2020    5:48 PM 03/16/2020    2:26 AM 02/21/2020    7:49 AM  Advanced Directives  Does Patient Have a Medical Advance Directive? Yes No Yes No No No Yes  Type of [place]  [place]    Living will  Does patient want to make changes to medical advance directive? No - Patient declined  No - Patient declined      Copy of [place]? No - copy requested  No - copy requested      Would patient like information on creating a medical advance directive?  No - Patient declined  No - Patient declined  No - Patient declined     Current Medications (verified) Outpatient Encounter Medications as of 10/01/2021  Medication Sig   Acetaminophen (TYLENOL ARTHRITIS PAIN PO) Take by mouth.   apixaban (ELIQUIS) 5 MG TABS tablet Take 1 tablet (5 mg total) by mouth 2 (two) times daily.   atorvastatin (LIPITOR) 20 MG tablet TAKE 1 TABLET BY MOUTH EVERY DAY   buPROPion (WELLBUTRIN XL) 150 MG 24 hr tablet TAKE 1 TABLET BY MOUTH EVERY DAY   Cholecalciferol (VITAMIN D3 PO) Take by mouth every evening.   esomeprazole (NEXIUM) 20 MG capsule Take 20 mg by mouth daily as needed.   fish oil-omega-3 fatty acids 1000 MG capsule Take 2 g by mouth every evening.   losartan (COZAAR) 50 MG tablet Take 1 tablet (50 mg total) by  mouth daily.   metoprolol succinate (TOPROL-XL) 50 MG 24 hr tablet TAKE 1 TABLET BY MOUTH EVERY DAY   Multiple Vitamin (MULTIVITAMIN) tablet Take 1 tablet by mouth every evening.   PREMARIN vaginal cream USE 1 TO 2 GRAMS INTRAVAGINALLY TWICE WEEKLY   [START ON 11/20/2021] Semaglutide-Weight Management 1 MG/0.5ML SOAJ Inject 1 mg into the skin once a week for 28 days.   [START ON 12/19/2021] Semaglutide-Weight Management 1.7 MG/0.75ML SOAJ Inject 1.7 mg into the skin once a week for 28 days.   [START ON 01/17/2022] Semaglutide-Weight Management 2.4 MG/0.75ML SOAJ Inject 2.4 mg into the skin once a week for 28 days.   WEGOVY 0.5 MG/0.5ML SOAJ Inject 0.5 mg into the skin once a week.   No facility-administered encounter medications on file as of 10/01/2021.    Allergies (verified) Clindamycin/lincomycin   History: Past Medical History:  Diagnosis Date   Anxiety    Depression    Diverticulosis of colon    one prior episode July 2011 caused by tomato overingestion   GERD (gastroesophageal reflux disease)    History of colonoscopy    [facility]  dec 2011[facility]  Hy[facility]    [place]ulmonary [place]oli [place] [facility] 03/16/2020  Past Surgical History:  Procedure Laterality Date   BREAST SURGERY     sterotactic breaswt biopsy: atypical cells,  continued diagnostic mammograms   COLONOSCOPY     COLONOSCOPY WITH PROPOFOL N/A 02/21/2020   Procedure: COLONOSCOPY WITH PROPOFOL;  Surgeon: Virgel Manifold, MD;  Location: [facility] ENDOSCOPY;  Service: Endoscopy;  Laterality: N/A;   PULMONARY THROMBECTOMY Bilateral 03/17/2020   Procedure: PULMONARY THROMBECTOMY / THROMBOLYSIS;  Surgeon: Katha Cabal, MD;  Location: [place] [facility];  Service: Cardiovascular;  Laterality: Bilateral;   Family History  Problem Relation Age of Onset   Cancer Mother        bilatera, contralateral recurred after 56 yrs   Stroke Father    Heart disease Father 56       died of stroke  at 63, tobacco user    Cancer Father        colon Ca   Cancer Maternal Uncle        breast x 2 aunts   Cancer Paternal Grandmother 86       metastatic colon Ca   Social History   Socioeconomic History   Marital status: Divorced    Spouse name: Not on file   Number of children: Not on file   Years of education: Not on file   Highest education level: Not on file  Occupational History   Occupation: Therapist, sports: 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[place])  Tobacco Use   Smoking status: Never   Smokeless tobacco: Never  Vaping Use   Vaping Use: Never used  Substance and Sexual Activity   Alcohol use: Yes    Alcohol/week: 1.0 standard drink of alcohol    Types: 1 Shots of liquor per week    Comment: occasional   Drug use: No   Sexual activity: Not on file  Other Topics Concern   Not on file  Social History Narrative   Not on file   Social Determinants of Health   Financial Resource Strain: Low Risk  (10/01/2021)   Overall Financial Resource Strain (CARDIA)    Difficulty of Paying Living Expenses: Not hard at all  Food Insecurity: No Food Insecurity (10/01/2021)   Hunger Vital Sign    Worried About Running Out of Food in the Last Year: Never true    Ran Out of Food in the Last Year: Never true  Transportation Needs: No Transportation Needs (10/01/2021)   PRAPARE - Hydrologist (Medical): No    Lack of Transportation (Non-Medical): No  Physical Activity: Unknown (08/06/2018)   Exercise Vital Sign    Days of Exercise per Week: 0 days    Minutes of Exercise per Session: Not on file  Stress: No Stress Concern Present (10/01/2021)   [address]    Feeling of Stress : Not at all  Social Connections: Unknown (10/01/2021)   Social Connection and Isolation Panel [NHANES]    Frequency of Communication with Friends and Family: Not on file    Frequency of Social Gatherings  with Friends and Family: Not on file    Attends Religious Services: More than 4 times per year    Active Member of [employer] or Organizations: Yes    Attends Archivist Meetings: Not on file    Marital Status: Not on file    Tobacco Counseling Counseling given: Not Answered   Clinical Intake:  Pre-visit  preparation completed: Yes        Diabetes: No  How often do you need to have someone help you when you read instructions, pamphlets, or other written materials from your doctor or pharmacy?: 1 - Never    Interpreter Needed?: No      Activities of Daily Living    10/01/2021    9:39 AM  In your present state of health, do you have any difficulty performing the following activities:  Hearing? 0  Vision? 0  Difficulty concentrating or making decisions? 0  Walking or climbing stairs? 0  Dressing or bathing? 0  Doing errands, shopping? 0  Preparing Food and eating ? N  Using the Toilet? N  In the past six months, have you accidently leaked urine? N  Do you have problems with loss of bowel control? N  Managing your Medications? N  Managing your Finances? N  Housekeeping or managing your Housekeeping? N    Patient Care Team: Crecencio Mc, MD as PCP - General (Internal Medicine)  Indicate any recent Medical Services you may have received from other than [facility] providers in the past year (date may be approximate).     Assessment:   This is a routine wellness examination for Michele Garza.  Virtual Visit via Telephone Note  I connected with  Michele Garza on 10/01/21 at  9:15 AM EDT by telephone and verified that I am speaking with the correct person using two identifiers.  Location: Patient: home Provider: other Persons participating in the virtual visit: patient/Nurse Health Advisor   I discussed the limitations of performing an evaluation and management service by telehealth. We continued and completed visit with audio only. Some vital signs may be absent  or patient reported.   Hearing/Vision screen Hearing Screening - Comments:: Patient is able to hear conversational tones without difficulty. No issues reported. Vision Screening - Comments:: Followed by Dr. Matilde Sprang Wears corrective lenses  They have seen their ophthalmologist in the last 12 months.   Dietary issues and exercise activities discussed: Current Exercise Habits: Home exercise routine, Type of exercise: walking, Intensity: Mild Healthy diet Good water intake   Goals Addressed               This Visit's Progress     Patient Stated     Weight goal 150lb (pt-stated)        Walk for exercise as tolerated Portion control meals       Depression Screen    10/01/2021    9:29 AM 09/23/2021   10:15 AM 06/18/2021   10:38 AM 11/30/2020    2:07 PM 09/30/2020   11:34 AM 08/14/2020    8:53 AM 03/27/2020    2:31 PM  PHQ 2/9 Scores  PHQ - 2 Score '2 2 1 1 '$ 0 0 0  PHQ- 9 Score '3 3 3  '$ 0 1 1    Fall Risk    10/01/2021    9:39 AM 09/23/2021    9:41 AM 06/18/2021    9:43 AM 11/30/2020    2:06 PM 09/30/2020   11:39 AM  Fall Risk   Falls in the past year? 0 0 0 0 0  Number falls in past yr: 0      Risk for fall due to :  No Fall Risks No Fall Risks No Fall Risks   Follow up Falls evaluation completed Falls evaluation completed Falls evaluation completed Falls evaluation completed Falls evaluation completed    FALL RISK PREVENTION PERTAINING TO THE  HOME: Home free of loose throw rugs in walkways, pet beds, electrical cords, etc? Yes  Adequate lighting in your home to reduce risk of falls? Yes   ASSISTIVE DEVICES UTILIZED TO PREVENT FALLS: Life alert? No  Use of a cane, walker or w/c? No  Grab bars in the bathroom? No  Shower chair or bench in shower? No  Elevated toilet seat or a handicapped toilet? No   TIMED UP AND GO: Was the test performed? No .   Cognitive Function:    08/07/2019   12:19 PM  MMSE - Mini Mental State Exam  Not completed: Unable to complete         10/01/2021    9:40 AM 08/06/2018   12:15 PM  6CIT Screen  What Year? 0 points 0 points  What month? 0 points 0 points  What time? 0 points 0 points  Count back from 20 0 points 0 points  Months in reverse 0 points 0 points  Repeat phrase 0 points 0 points  Total Score 0 points 0 points    Immunizations Immunization History  Administered Date(s) Administered   Fluad Quad(high Dose 65+) 11/04/2019, 11/30/2020   Influenza,inj,Quad PF,6+ Mos 12/08/2017, 09/07/2018   PFIZER(Purple Top)SARS-COV-2 Vaccination 02/08/2019, 03/01/2019, 10/15/2019   Pneumococcal Conjugate-13 04/28/2017   Tdap 02/28/2013   Zoster Recombinat (Shingrix) 07/26/2021   Shingrix vaccine- first dose completed. Agrees to update immunization record upon completion.   Screening Tests Health Maintenance  Topic Date Due   COVID-19 Vaccine (4 - Pfizer series) 10/09/2021 (Originally 12/10/2019)   Zoster Vaccines- Shingrix (2 of 2) 10/11/2021 (Originally 09/20/2021)   Pneumonia Vaccine 16+ Years old (2 - PPSV23 or PCV20) 12/17/2021 (Originally 04/29/2018)   INFLUENZA VACCINE  04/17/2022 (Originally 08/17/2021)   MAMMOGRAM  07/07/2022   TETANUS/TDAP  03/01/2023   COLONOSCOPY (Pts 45-30yr Insurance coverage will need to be confirmed)  02/20/2025   DEXA SCAN  Completed   Hepatitis C Screening  Completed   HPV VACCINES  Aged Out   Health Maintenance There are no preventive care reminders to display for this patient.  Lung Cancer Screening: (Low Dose CT Chest recommended if Age 70-80years, 30 pack-year currently smoking OR have quit w/in 15years.) does not qualify.   Vision Screening: Recommended annual ophthalmology exams for early detection of glaucoma and other disorders of the eye.  Dental Screening: Recommended annual dental exams for proper oral hygiene  Community Resource Referral / Chronic Care Management: CRR required this visit?  No   CCM required this visit?  No      Plan:     I have personally  reviewed and noted the following in the patient's chart:   Medical and social history Use of alcohol, tobacco or illicit drugs  Current medications and supplements including opioid prescriptions. Patient is not currently taking opioid prescriptions. Functional ability and status Nutritional status Physical activity Advanced directives List of other physicians Hospitalizations, surgeries, and ER visits in previous 12 months Vitals Screenings to include cognitive, depression, and falls Referrals and appointments  In addition, I have reviewed and discussed with patient certain preventive protocols, quality metrics, and best practice recommendations. A written personalized care plan for preventive services as well as general preventive health recommendations were provided to patient.     OBrien-Blaney, Kyleena Scheirer L, LPN   [id number]    I have reviewed the above information and agree with above.   TDeborra Medina MD

## 2021-10-01 NOTE — Patient Instructions (Addendum)
[name] , Thank you for taking time to come for your Medicare Wellness Visit. I appreciate your ongoing commitment to your health goals. Please review the following plan we discussed and let me know if I can assist you in the future.   These are the goals we discussed:  Goals       Patient Stated     Weight goal 150lb (pt-stated)      Walk for exercise as tolerated Portion control meals        This is a list of the screening recommended for you and due dates:  Health Maintenance  Topic Date Due   COVID-19 Vaccine (4 - Pfizer series) 10/09/2021*   Zoster (Shingles) Vaccine (2 of 2) 10/11/2021*   Pneumonia Vaccine (2 - PPSV23 or PCV20) 12/17/2021*   Flu Shot  04/17/2022*   Mammogram  07/07/2022   Tetanus Vaccine  03/01/2023   Colon Cancer Screening  02/20/2025   DEXA scan (bone density measurement)  Completed   Hepatitis C Screening: USPSTF Recommendation to screen - Ages 55-79 yo.  Completed   HPV Vaccine  Aged Out  *Topic was postponed. The date shown is not the original due date.     Next appointment: Follow up in one year for your annual wellness visit    Preventive Care 70 Years and Older, Female Preventive care refers to lifestyle choices and visits with your health care provider that can promote health and wellness. What does preventive care include? A yearly physical exam. This is also called an annual well check. Dental exams once or twice a year. Routine eye exams. Ask your health care provider how often you should have your eyes checked. Personal lifestyle choices, including: Daily care of your teeth and gums. Regular physical activity. Eating a healthy diet. Avoiding tobacco and drug use. Limiting alcohol use. Practicing safe sex. Taking low-dose aspirin every day. Taking vitamin and mineral supplements as recommended by your health care provider. What happens during an annual well check? The services and screenings done by your health care provider  during your annual well check will depend on your age, overall health, lifestyle risk factors, and family history of disease. Counseling  Your health care provider may ask you questions about your: Alcohol use. Tobacco use. Drug use. Emotional well-being. Home and relationship well-being. Sexual activity. Eating habits. History of falls. Memory and ability to understand (cognition). Work and work Statistician. Reproductive health. Screening  You may have the following tests or measurements: Height, weight, and BMI. Blood pressure. Lipid and cholesterol levels. These may be checked every 5 years, or more frequently if you are over 61 years old. Skin check. Lung cancer screening. You may have this screening every year starting at age 78 if you have a 30-pack-year history of smoking and currently smoke or have quit within the past 15 years. Fecal occult blood test (FOBT) of the stool. You may have this test every year starting at age 49. Flexible sigmoidoscopy or colonoscopy. You may have a sigmoidoscopy every 5 years or a colonoscopy every 10 years starting at age 7. Hepatitis C blood test. Hepatitis B blood test. Sexually transmitted disease (STD) testing. Diabetes screening. This is done by checking your blood sugar (glucose) after you have not eaten for a while (fasting). You may have this done every 1-3 years. Bone density scan. This is done to screen for osteoporosis. You may have this done starting at age 22. Mammogram. This may be done every 1-2 years. Talk to  your health care provider about how often you should have regular mammograms. Talk with your health care provider about your test results, treatment options, and if necessary, the need for more tests. Vaccines  Your health care provider may recommend certain vaccines, such as: Influenza vaccine. This is recommended every year. Tetanus, diphtheria, and acellular pertussis (Tdap, Td) vaccine. You may need a Td booster every  10 years. Zoster vaccine. You may need this after age 18. Pneumococcal 13-valent conjugate (PCV13) vaccine. One dose is recommended after age 36. Pneumococcal polysaccharide (PPSV23) vaccine. One dose is recommended after age 72. Talk to your health care provider about which screenings and vaccines you need and how often you need them. This information is not intended to replace advice given to you by your health care provider. Make sure you discuss any questions you have with your health care provider. Document Released: 01/30/2015 Document Revised: 09/23/2015 Document Reviewed: 11/04/2014 Elsevier Interactive Patient Education  2017 [place] Prevention in the Home Falls can cause injuries. They can happen to people of all ages. There are many things you can do to make your home safe and to help prevent falls. What can I do on the outside of my home? Regularly fix the edges of walkways and driveways and fix any cracks. Remove anything that might make you trip as you walk through a door, such as a raised step or threshold. Trim any bushes or trees on the path to your home. Use bright outdoor lighting. Clear any walking paths of anything that might make someone trip, such as rocks or tools. Regularly check to see if handrails are loose or broken. Make sure that both sides of any steps have handrails. Any raised decks and porches should have guardrails on the edges. Have any leaves, snow, or ice cleared regularly. Use sand or salt on walking paths during winter. Clean up any spills in your garage right away. This includes oil or grease spills. What can I do in the bathroom? Use night lights. Install grab bars by the toilet and in the tub and shower. Do not use towel bars as grab bars. Use non-skid mats or decals in the tub or shower. If you need to sit down in the shower, use a plastic, non-slip stool. Keep the floor dry. Clean up any water that spills on the floor as soon as it  happens. Remove soap buildup in the tub or shower regularly. Attach bath mats securely with double-sided non-slip rug tape. Do not have throw rugs and other things on the floor that can make you trip. What can I do in the bedroom? Use night lights. Make sure that you have a light by your bed that is easy to reach. Do not use any sheets or blankets that are too big for your bed. They should not hang down onto the floor. Have a firm chair that has side arms. You can use this for support while you get dressed. Do not have throw rugs and other things on the floor that can make you trip. What can I do in the kitchen? Clean up any spills right away. Avoid walking on wet floors. Keep items that you use a lot in easy-to-reach places. If you need to reach something above you, use a strong step stool that has a grab bar. Keep electrical cords out of the way. Do not use floor polish or wax that makes floors slippery. If you must use wax, use non-skid floor wax. Do not  have throw rugs and other things on the floor that can make you trip. What can I do with my stairs? Do not leave any items on the stairs. Make sure that there are handrails on both sides of the stairs and use them. Fix handrails that are broken or loose. Make sure that handrails are as long as the stairways. Check any carpeting to make sure that it is firmly attached to the stairs. Fix any carpet that is loose or worn. Avoid having throw rugs at the top or bottom of the stairs. If you do have throw rugs, attach them to the floor with carpet tape. Make sure that you have a light switch at the top of the stairs and the bottom of the stairs. If you do not have them, ask someone to add them for you. What else can I do to help prevent falls? Wear shoes that: Do not have high heels. Have rubber bottoms. Are comfortable and fit you well. Are closed at the toe. Do not wear sandals. If you use a stepladder: Make sure that it is fully opened.  Do not climb a closed stepladder. Make sure that both sides of the stepladder are locked into place. Ask someone to hold it for you, if possible. Clearly mark and make sure that you can see: Any grab bars or handrails. First and last steps. Where the edge of each step is. Use tools that help you move around (mobility aids) if they are needed. These include: Canes. Walkers. Scooters. Crutches. Turn on the lights when you go into a dark area. Replace any light bulbs as soon as they burn out. Set up your furniture so you have a clear path. Avoid moving your furniture around. If any of your floors are uneven, fix them. If there are any pets around you, be aware of where they are. Review your medicines with your doctor. Some medicines can make you feel dizzy. This can increase your chance of falling. Ask your doctor what other things that you can do to help prevent falls. This information is not intended to replace advice given to you by your health care provider. Make sure you discuss any questions you have with your health care provider. Document Released: 10/30/2008 Document Revised: 06/11/2015 Document Reviewed: 02/07/2014 Elsevier Interactive Patient Education  2017 [employer].

## 2021-10-12 ENCOUNTER — Encounter: Payer: Self-pay | Admitting: Internal Medicine

## 2021-10-12 MED ORDER — SEMAGLUTIDE-WEIGHT MANAGEMENT 1 MG/0.5ML ~~LOC~~ SOAJ: 1.0000 mg | SUBCUTANEOUS | 0 refills | Status: DC

## 2021-10-15 ENCOUNTER — Encounter: Payer: Self-pay | Admitting: Internal Medicine

## 2021-10-19 MED ORDER — SEMAGLUTIDE-WEIGHT MANAGEMENT 1.7 MG/0.75ML ~~LOC~~ SOAJ: 1.7000 mg | SUBCUTANEOUS | 0 refills | Status: DC

## 2021-10-19 MED ORDER — SEMAGLUTIDE-WEIGHT MANAGEMENT 1 MG/0.5ML ~~LOC~~ SOAJ: 1.0000 mg | SUBCUTANEOUS | 0 refills | Status: DC

## 2021-11-01 ENCOUNTER — Encounter: Payer: Self-pay | Admitting: Internal Medicine

## 2021-11-04 ENCOUNTER — Other Ambulatory Visit: Payer: Self-pay | Admitting: Internal Medicine

## 2021-11-11 ENCOUNTER — Telehealth: Payer: Self-pay | Admitting: *Deleted

## 2021-11-11 NOTE — Patient Outreach (Signed)
  Care Coordination   Initial Visit Note   11/11/2021 Name: Michele Garza MRN: [id number] DOB: 02-27-1950  Michele Garza is a 71 y.o. year old female who sees Derrel Nip, Aris Everts, MD for primary care. I spoke with  Cora Collum by phone today.  What matters to the patients health and wellness today?  No health concerns voiced. RN discussed [place], RN, SW, and Pharmacist. Patient declined services.     Goals Addressed             This Visit's Progress    Patient advised to follow up with Vaccines. AWV done          SDOH assessments and interventions completed:  Yes     Care Coordination Interventions Activated:  Yes  Care Coordination Interventions:  No, not indicated   Follow up plan: No further intervention required.   Encounter Outcome:  Pt. [place] [facility] [phone]

## 2021-11-15 ENCOUNTER — Encounter (INDEPENDENT_AMBULATORY_CARE_PROVIDER_SITE_OTHER): Payer: Self-pay

## 2021-12-10 ENCOUNTER — Other Ambulatory Visit: Payer: Self-pay | Admitting: Internal Medicine

## 2021-12-15 ENCOUNTER — Telehealth: Payer: Self-pay | Admitting: Internal Medicine

## 2021-12-15 DIAGNOSIS — E7800 Pure hypercholesterolemia, unspecified: Secondary | ICD-10-CM

## 2021-12-15 DIAGNOSIS — D649 Anemia, unspecified: Secondary | ICD-10-CM

## 2021-12-15 NOTE — Telephone Encounter (Signed)
Orders placed.

## 2021-12-15 NOTE — Addendum Note (Signed)
Addended by: Crecencio Mc on: 12/15/2021 03:42 PM   Modules accepted: Orders

## 2021-12-15 NOTE — Telephone Encounter (Signed)
Patient has a lab appointment 12/20/2021, there are no orders in.

## 2021-12-18 ENCOUNTER — Other Ambulatory Visit: Payer: Self-pay | Admitting: Internal Medicine

## 2021-12-20 ENCOUNTER — Other Ambulatory Visit: Payer: Medicare PPO

## 2021-12-22 DIAGNOSIS — M170 Bilateral primary osteoarthritis of knee: Secondary | ICD-10-CM | POA: Diagnosis not present

## 2021-12-23 ENCOUNTER — Ambulatory Visit: Payer: Medicare PPO | Admitting: Internal Medicine

## 2021-12-31 DIAGNOSIS — H43813 Vitreous degeneration, bilateral: Secondary | ICD-10-CM | POA: Diagnosis not present

## 2021-12-31 DIAGNOSIS — H2513 Age-related nuclear cataract, bilateral: Secondary | ICD-10-CM | POA: Diagnosis not present

## 2021-12-31 DIAGNOSIS — H35411 Lattice degeneration of retina, right eye: Secondary | ICD-10-CM | POA: Diagnosis not present

## 2022-01-02 ENCOUNTER — Encounter: Payer: Self-pay | Admitting: Internal Medicine

## 2022-01-03 ENCOUNTER — Telehealth: Payer: Medicare PPO | Admitting: Family

## 2022-01-03 ENCOUNTER — Encounter: Payer: Self-pay | Admitting: Family

## 2022-01-03 VITALS — Temp 98.3°F | Ht 63.0 in | Wt 205.0 lb

## 2022-01-03 DIAGNOSIS — U071 COVID-19: Secondary | ICD-10-CM | POA: Diagnosis not present

## 2022-01-03 DIAGNOSIS — J0110 Acute frontal sinusitis, unspecified: Secondary | ICD-10-CM | POA: Diagnosis not present

## 2022-01-03 MED ORDER — AMOXICILLIN-POT CLAVULANATE 875-125 MG PO TABS: 1.0000 | ORAL_TABLET | Freq: Two times a day (BID) | ORAL | 0 refills | Status: DC

## 2022-01-03 NOTE — Progress Notes (Signed)
MyChart Video Visit    Virtual Visit via Video Note   This format is felt to be most appropriate for this patient at this time. Physical exam was limited by quality of the video and audio technology used for the visit. CMA was able to get the patient set up on a video visit.  Patient location: Home. Patient and provider in visit Provider location: Office  I discussed the limitations of evaluation and management by telemedicine and the availability of in person appointments. The patient expressed understanding and agreed to proceed.  Visit Date: 01/03/2022  Today's healthcare provider: Jeanie Sewer, NP     Subjective:   Patient ID: Michele Garza, female    DOB: [birth year], 71 y.o.   MRN: [id number]  Chief Complaint  Patient presents with   Covid Positive    Covid positive yesterday, Symptom include Nasal/Chest congestion and drainage, Fever of 100.7, Cough , No taste. Has tried tylenol which did help fever. Symptoms started on 12/13.     HPI URI sx:  pt started with sx 6 days ago with sinus drainage that was thin and now thicker dark green,foul taste, but now lost her sense of taste & smell, reports having fever on last Thursday, tested for covid and it was negative, she is unsure if she got a good sample, & rechecked yesterday & positive.  Denies sinus pain, but has pressure and headaches.   Assessment & Plan:   Problem List Items Addressed This Visit   None Visit Diagnoses     Acute non-recurrent frontal sinusitis    -  Primary sending Augmentin, advised on use & SE, but advised pt to wait 2 days to see if sx resolve, as this most likely is viral d/t her Covid dx. Advised on using saline nasal spray tid & OTC antihistamine, pt unable to use decongestant d/t HTN. Drink plenty of fluids.     Relevant Medications   amoxicillin-clavulanate (AUGMENTIN) 875-125 MG tablet (Start on 01/05/2022)   COVID-19    - pt w/sx for 5d, advised the antiviral would not be of  benefit to her. Continue to treat sx with OTC meds, plenty of fluids, Tylenol for fever or pain.  Ok to go out in public with a mask, must test negative to go out without a mask over next 5d. Call the office back if sx are not improving by the weekend.    Past Medical History:  Diagnosis Date   Anxiety    Depression    Diverticulosis of colon    one prior episode July 2011 caused by tomato overingestion   GERD (gastroesophageal reflux disease)    History of colonoscopy    done in  dec 2011, repeat due 5 yrs due to [facility]   Hypertension    Pulmonary emboli ([place]) 03/16/2020    Past Surgical History:  Procedure Laterality Date   BREAST SURGERY     sterotactic breaswt biopsy: atypical cells,  continued diagnostic mammograms   COLONOSCOPY     COLONOSCOPY WITH PROPOFOL N/A 02/21/2020   Procedure: COLONOSCOPY WITH PROPOFOL;  Surgeon: Virgel Manifold, MD;  Location: [facility] ENDOSCOPY;  Service: Endoscopy;  Laterality: N/A;   PULMONARY THROMBECTOMY Bilateral 03/17/2020   Procedure: PULMONARY THROMBECTOMY / THROMBOLYSIS;  Surgeon: Katha Cabal, MD;  Location: [facility] LAB;  Service: Cardiovascular;  Laterality: Bilateral;    Outpatient Medications Prior to Visit  Medication Sig Dispense Refill   Acetaminophen (TYLENOL ARTHRITIS PAIN PO) Take by mouth.  atorvastatin (LIPITOR) 20 MG tablet TAKE 1 TABLET BY MOUTH EVERY DAY 90 tablet 3   buPROPion (WELLBUTRIN XL) 150 MG 24 hr tablet TAKE 1 TABLET BY MOUTH EVERY DAY 90 tablet 1   Cholecalciferol (VITAMIN D3 PO) Take by mouth every evening.     ELIQUIS 5 MG TABS tablet TAKE 1 TABLET BY MOUTH TWICE A DAY 60 tablet 5   esomeprazole (NEXIUM) 20 MG capsule Take 20 mg by mouth daily as needed.     fish oil-omega-3 fatty acids 1000 MG capsule Take 2 g by mouth every evening.     losartan (COZAAR) 50 MG tablet TAKE 1 TABLET BY MOUTH EVERY DAY 90 tablet 1   metoprolol succinate (TOPROL-XL) 50 MG 24 hr tablet TAKE 1 TABLET BY MOUTH EVERY DAY 90  tablet 1   Multiple Vitamin (MULTIVITAMIN) tablet Take 1 tablet by mouth every evening.     PREMARIN vaginal cream USE 1 TO 2 GRAMS INTRAVAGINALLY TWICE WEEKLY 30 g 2   Semaglutide-Weight Management 1 MG/0.5ML SOAJ Inject 1 mg into the skin once a week. 2 mL 0   Semaglutide-Weight Management 1.7 MG/0.75ML SOAJ Inject 1.7 mg into the skin once a week. 3 mL 0   [START ON 01/17/2022] Semaglutide-Weight Management 2.4 MG/0.75ML SOAJ Inject 2.4 mg into the skin once a week for 28 days. 3 mL 0   WEGOVY 0.5 MG/0.5ML SOAJ Inject 0.5 mg into the skin once a week.     No facility-administered medications prior to visit.    Allergies  Allergen Reactions   Clindamycin/Lincomycin Rash       Objective:   Physical Exam Vitals and nursing note reviewed.  Constitutional:      General: Pt is not in acute distress.    Appearance: Normal appearance.  HENT:     Head: Normocephalic.  Pulmonary:     Effort: No respiratory distress.  Musculoskeletal:     Cervical back: Normal range of motion.  Skin:    General: Skin is dry.     Coloration: Skin is not pale.  Neurological:     Mental Status: Pt is alert and oriented to person, place, and time.  Psychiatric:        Mood and Affect: Mood normal.   Temp 98.3 F (36.8 C) (Oral)   Ht '5\' 3"'$  (1.6 m)   Wt 205 lb (93 kg)   BMI 36.31 kg/m   Wt Readings from Last 3 Encounters:  01/03/22 205 lb (93 kg)  10/01/21 216 lb (98 kg)  09/23/21 216 lb 3.2 oz (98.1 kg)        I discussed the assessment and treatment plan with the patient. The patient was provided an opportunity to ask questions and all were answered. The patient agreed with the plan and demonstrated an understanding of the instructions.   The patient was advised to call back or seek an in-person evaluation if the symptoms worsen or if the condition fails to improve as anticipated.  Jeanie Sewer, NP [place] [phone] (phone) [phone] (fax)  [place]

## 2022-01-03 NOTE — Telephone Encounter (Signed)
Pt has been scheduled for a video visit this afternoon with Jeanie Sewer, NP.

## 2022-01-04 ENCOUNTER — Other Ambulatory Visit: Payer: Medicare PPO

## 2022-01-14 ENCOUNTER — Other Ambulatory Visit: Payer: Medicare PPO

## 2022-01-16 ENCOUNTER — Ambulatory Visit: Payer: Self-pay

## 2022-01-21 ENCOUNTER — Other Ambulatory Visit (INDEPENDENT_AMBULATORY_CARE_PROVIDER_SITE_OTHER): Payer: Medicare PPO

## 2022-01-21 DIAGNOSIS — E7800 Pure hypercholesterolemia, unspecified: Secondary | ICD-10-CM | POA: Diagnosis not present

## 2022-01-21 DIAGNOSIS — D649 Anemia, unspecified: Secondary | ICD-10-CM

## 2022-01-21 LAB — CBC WITH DIFFERENTIAL/PLATELET
Basophils Absolute: 0 10*3/uL (ref 0.0–0.1)
Basophils Relative: 0.4 % (ref 0.0–3.0)
Eosinophils Absolute: 0.1 10*3/uL (ref 0.0–0.7)
Eosinophils Relative: 1.3 % (ref 0.0–5.0)
HCT: 40.8 % (ref 36.0–46.0)
Hemoglobin: 13.6 g/dL (ref 12.0–15.0)
Lymphocytes Relative: 37.5 % (ref 12.0–46.0)
Lymphs Abs: 3.1 10*3/uL (ref 0.7–4.0)
MCHC: 33.3 g/dL (ref 30.0–36.0)
MCV: 95.6 fl (ref 78.0–100.0)
Monocytes Absolute: 0.8 10*3/uL (ref 0.1–1.0)
Monocytes Relative: 10 % (ref 3.0–12.0)
Neutro Abs: 4.2 10*3/uL (ref 1.4–7.7)
Neutrophils Relative %: 50.8 % (ref 43.0–77.0)
Platelets: 264 10*3/uL (ref 150.0–400.0)
RBC: 4.26 Mil/uL (ref 3.87–5.11)
RDW: 13.9 % (ref 11.5–15.5)
WBC: 8.3 10*3/uL (ref 4.0–10.5)

## 2022-01-21 LAB — LIPID PANEL
Cholesterol: 180 mg/dL (ref 0–200)
HDL: 56 mg/dL (ref >39.00)
LDL Cholesterol: 109 mg/dL — ABNORMAL HIGH (ref 0–99)
NonHDL: 123.78
Total CHOL/HDL Ratio: 3
Triglycerides: 76 mg/dL (ref 0.0–149.0)
VLDL: 15.2 mg/dL (ref 0.0–40.0)

## 2022-01-21 LAB — COMPREHENSIVE METABOLIC PANEL
ALT: 13 U/L (ref 0–35)
AST: 16 U/L (ref 0–37)
Albumin: 4.1 g/dL (ref 3.5–5.2)
Alkaline Phosphatase: 95 U/L (ref 39–117)
BUN: 20 mg/dL (ref 6–23)
CO2: 32 mEq/L (ref 19–32)
Calcium: 10 mg/dL (ref 8.4–10.5)
Chloride: 102 mEq/L (ref 96–112)
Creatinine, Ser: 0.93 mg/dL (ref 0.40–1.20)
GFR: 62.34 mL/min (ref >60.00)
Glucose, Bld: 100 mg/dL — ABNORMAL HIGH (ref 70–99)
Potassium: 4.4 mEq/L (ref 3.5–5.1)
Sodium: 141 mEq/L (ref 135–145)
Total Bilirubin: 0.6 mg/dL (ref 0.2–1.2)
Total Protein: 6.5 g/dL (ref 6.0–8.3)

## 2022-01-27 DIAGNOSIS — E041 Nontoxic single thyroid nodule: Secondary | ICD-10-CM | POA: Diagnosis not present

## 2022-01-27 DIAGNOSIS — Z7901 Long term (current) use of anticoagulants: Secondary | ICD-10-CM | POA: Diagnosis not present

## 2022-01-27 DIAGNOSIS — K219 Gastro-esophageal reflux disease without esophagitis: Secondary | ICD-10-CM | POA: Diagnosis not present

## 2022-01-27 DIAGNOSIS — Z86711 Personal history of pulmonary embolism: Secondary | ICD-10-CM | POA: Diagnosis not present

## 2022-01-27 DIAGNOSIS — E042 Nontoxic multinodular goiter: Secondary | ICD-10-CM | POA: Diagnosis not present

## 2022-01-27 DIAGNOSIS — Z87891 Personal history of nicotine dependence: Secondary | ICD-10-CM | POA: Diagnosis not present

## 2022-01-27 DIAGNOSIS — Z79899 Other long term (current) drug therapy: Secondary | ICD-10-CM | POA: Diagnosis not present

## 2022-01-27 DIAGNOSIS — E049 Nontoxic goiter, unspecified: Secondary | ICD-10-CM | POA: Diagnosis not present

## 2022-01-27 DIAGNOSIS — E7800 Pure hypercholesterolemia, unspecified: Secondary | ICD-10-CM | POA: Diagnosis not present

## 2022-01-27 DIAGNOSIS — I10 Essential (primary) hypertension: Secondary | ICD-10-CM | POA: Diagnosis not present

## 2022-01-27 HISTORY — PX: THYROIDECTOMY, PARTIAL: SHX18

## 2022-01-31 ENCOUNTER — Telehealth: Payer: Self-pay

## 2022-01-31 ENCOUNTER — Encounter: Payer: Self-pay | Admitting: Internal Medicine

## 2022-01-31 NOTE — Patient Outreach (Signed)
  Care Coordination [facility] Note Transition Care Management Unsuccessful Follow-up Telephone Call  Date of discharge and from where:  01/28/22 [facility] dx lt hemithyroidectomy   Attempts:  1st Attempt  Reason for unsuccessful TCM follow-up call:  Left voice message Peter Garter RN, [facility], [facility] [phone]

## 2022-02-01 ENCOUNTER — Telehealth: Payer: Self-pay

## 2022-02-01 NOTE — Patient Outreach (Signed)
  Care Coordination [facility] Note Transition Care Management Follow-up Telephone Call Date of discharge and from where: 01/28/22 [facility] dx rt hemithyroidectomy How have you been since you were released from the hospital? I am doing good and have only needed Tylenol for pain Any questions or concerns? No  Items Reviewed: Did the pt receive and understand the discharge instructions provided? Yes  Medications obtained and verified? Yes  Other? Yes  Any new allergies since your discharge? No  Dietary orders reviewed? No Do you have support at home? Yes   Home Care and Equipment/Supplies: Were home health services ordered? no If so, what is the name of the agency? N/a  Has the agency set up a time to come to the patient's home? not applicable Were any new equipment or medical supplies ordered?  No What is the name of the medical supply agency? N/a Were you able to get the supplies/equipment? not applicable Do you have any questions related to the use of the equipment or supplies? No  Functional Questionnaire: (I = Independent and D = Dependent) ADLs: I  Bathing/Dressing- I  Meal Prep- I  Eating- I  Maintaining continence- I  Transferring/Ambulation- I  Managing Meds- I  Follow up appointments reviewed:  PCP Hospital f/u appt confirmed? No  Scheduled to see  on  @ . [facility] f/u appt confirmed? Yes  Scheduled to see Dr. Ihor Austin on 02/02/22 @ 9:15AM. Are transportation arrangements needed? No  If their condition worsens, is the pt aware to call PCP or go to the Emergency Dept.? Yes Was the patient provided with contact information for the PCP's office or ED? Yes Was to pt encouraged to call back with questions or concerns? Yes  SDOH assessments and interventions completed:   Yes SDOH Interventions Today    Flowsheet Row Most Recent Value  SDOH Interventions   Food Insecurity Interventions Intervention Not Indicated  Transportation Interventions  Intervention Not Indicated       Care Coordination Interventions:  Reviewed s/sx of infection to call provider    Encounter Outcome:  Pt. Visit Completed   Peter Garter RN, BSN,CCM, CDE Care Management Coordinator [facility] [phone]

## 2022-02-02 DIAGNOSIS — E079 Disorder of thyroid, unspecified: Secondary | ICD-10-CM | POA: Diagnosis not present

## 2022-03-31 ENCOUNTER — Other Ambulatory Visit: Payer: Self-pay | Admitting: Internal Medicine

## 2022-04-01 ENCOUNTER — Ambulatory Visit: Payer: Medicare PPO | Admitting: Internal Medicine

## 2022-04-11 DIAGNOSIS — M170 Bilateral primary osteoarthritis of knee: Secondary | ICD-10-CM | POA: Diagnosis not present

## 2022-05-02 ENCOUNTER — Other Ambulatory Visit: Payer: Self-pay | Admitting: Internal Medicine

## 2022-05-04 ENCOUNTER — Encounter: Payer: Self-pay | Admitting: Internal Medicine

## 2022-05-04 ENCOUNTER — Ambulatory Visit: Payer: Medicare PPO | Admitting: Internal Medicine

## 2022-05-04 VITALS — BP 130/80 | HR 96 | Temp 98.4°F | Ht 63.0 in | Wt 214.6 lb

## 2022-05-04 DIAGNOSIS — F324 Major depressive disorder, single episode, in partial remission: Secondary | ICD-10-CM

## 2022-05-04 DIAGNOSIS — Z1231 Encounter for screening mammogram for malignant neoplasm of breast: Secondary | ICD-10-CM | POA: Diagnosis not present

## 2022-05-04 DIAGNOSIS — E049 Nontoxic goiter, unspecified: Secondary | ICD-10-CM

## 2022-05-04 DIAGNOSIS — R7301 Impaired fasting glucose: Secondary | ICD-10-CM | POA: Diagnosis not present

## 2022-05-04 DIAGNOSIS — E7800 Pure hypercholesterolemia, unspecified: Secondary | ICD-10-CM

## 2022-05-04 DIAGNOSIS — I2609 Other pulmonary embolism with acute cor pulmonale: Secondary | ICD-10-CM

## 2022-05-04 DIAGNOSIS — R7989 Other specified abnormal findings of blood chemistry: Secondary | ICD-10-CM

## 2022-05-04 DIAGNOSIS — M170 Bilateral primary osteoarthritis of knee: Secondary | ICD-10-CM

## 2022-05-04 DIAGNOSIS — E890 Postprocedural hypothyroidism: Secondary | ICD-10-CM | POA: Diagnosis not present

## 2022-05-04 DIAGNOSIS — I700 Atherosclerosis of aorta: Secondary | ICD-10-CM | POA: Diagnosis not present

## 2022-05-04 DIAGNOSIS — I2699 Other pulmonary embolism without acute cor pulmonale: Secondary | ICD-10-CM

## 2022-05-04 DIAGNOSIS — K5900 Constipation, unspecified: Secondary | ICD-10-CM

## 2022-05-04 DIAGNOSIS — I10 Essential (primary) hypertension: Secondary | ICD-10-CM

## 2022-05-04 DIAGNOSIS — R0981 Nasal congestion: Secondary | ICD-10-CM | POA: Insufficient documentation

## 2022-05-04 DIAGNOSIS — Z6841 Body Mass Index (BMI) 40.0 and over, adult: Secondary | ICD-10-CM

## 2022-05-04 LAB — COMPREHENSIVE METABOLIC PANEL
ALT: 19 U/L (ref 0–35)
AST: 19 U/L (ref 0–37)
Albumin: 4 g/dL (ref 3.5–5.2)
Alkaline Phosphatase: 56 U/L (ref 39–117)
BUN: 25 mg/dL — ABNORMAL HIGH (ref 6–23)
CO2: 27 mEq/L (ref 19–32)
Calcium: 9.5 mg/dL (ref 8.4–10.5)
Chloride: 103 mEq/L (ref 96–112)
Creatinine, Ser: 1.06 mg/dL (ref 0.40–1.20)
GFR: 53.17 mL/min — ABNORMAL LOW (ref >60.00)
Glucose, Bld: 97 mg/dL (ref 70–99)
Potassium: 4.1 mEq/L (ref 3.5–5.1)
Sodium: 141 mEq/L (ref 135–145)
Total Bilirubin: 0.6 mg/dL (ref 0.2–1.2)
Total Protein: 6.5 g/dL (ref 6.0–8.3)

## 2022-05-04 LAB — LDL CHOLESTEROL, DIRECT: Direct LDL: 113 mg/dL

## 2022-05-04 LAB — TSH: TSH: 2.21 u[IU]/mL (ref 0.35–5.50)

## 2022-05-04 LAB — MICROALBUMIN / CREATININE URINE RATIO
Creatinine,U: 183.8 mg/dL
Microalb Creat Ratio: 0.5 mg/g (ref 0.0–30.0)
Microalb, Ur: 0.9 mg/dL (ref 0.0–1.9)

## 2022-05-04 LAB — LIPID PANEL
Cholesterol: 185 mg/dL (ref 0–200)
HDL: 63.2 mg/dL (ref >39.00)
LDL Cholesterol: 110 mg/dL — ABNORMAL HIGH (ref 0–99)
NonHDL: 122.1
Total CHOL/HDL Ratio: 3
Triglycerides: 63 mg/dL (ref 0.0–149.0)
VLDL: 12.6 mg/dL (ref 0.0–40.0)

## 2022-05-04 LAB — HEMOGLOBIN A1C: Hgb A1c MFr Bld: 5.8 % (ref 4.6–6.5)

## 2022-05-04 MED ORDER — ATORVASTATIN CALCIUM 20 MG PO TABS: 20.0000 mg | ORAL_TABLET | Freq: Every day | ORAL | 3 refills | Status: DC

## 2022-05-04 MED ORDER — LOSARTAN POTASSIUM 50 MG PO TABS: 50.0000 mg | ORAL_TABLET | Freq: Every day | ORAL | 1 refills | Status: DC

## 2022-05-04 NOTE — Assessment & Plan Note (Signed)
..  Reviewed reason for statin .   Patient is tolerating high potency statin therapy with atorvastatin  20 mg daiy

## 2022-05-04 NOTE — Assessment & Plan Note (Signed)
Will resume Wegovy at 0.5 mg dose (or Ozempic if the price is the same , since more doses can be delivered from Ozempic pen )

## 2022-05-04 NOTE — Assessment & Plan Note (Signed)
Home readings 130/80 or less on 50 mg losartan dose.  Encouarged to continue checking BP at home

## 2022-05-04 NOTE — Assessment & Plan Note (Signed)
No indication for abx based on history and exa.  Advised to use Afrin prn congestion max 4 days

## 2022-05-04 NOTE — Assessment & Plan Note (Signed)
She is seeing orthopedics periodically.  Used motrin 800 mg once recently without gastritis.

## 2022-05-04 NOTE — Patient Instructions (Addendum)
If you find out whether Wegovy and Ozempic are the same price let me know so I can decide which one to send in for you..  Ozempic pens can be manipulated to deliver lower doses (and save you money)  You can use Afrin nasal spray for 3 -4 days to manage the sinus congestion.  No antibiotics are needed at this time.

## 2022-05-04 NOTE — Assessment & Plan Note (Addendum)
S/p left hemithyroidectomy Jan 2024. Benign path, . Rechecking TSH and calcium today

## 2022-05-04 NOTE — Assessment & Plan Note (Signed)
  Resolved by recent check.  Rechecking today    Lab Results  Component Value Date   ALT 13 01/21/2022   AST 16 01/21/2022   ALKPHOS 95 01/21/2022   BILITOT 0.6 01/21/2022

## 2022-05-04 NOTE — Assessment & Plan Note (Signed)
Advised to add magnesium supplement when restarting [facility]
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
## 2022-05-06 ENCOUNTER — Encounter: Payer: Self-pay | Admitting: Internal Medicine

## 2022-05-07 ENCOUNTER — Other Ambulatory Visit: Payer: Self-pay | Admitting: *Deleted

## 2022-05-07 DIAGNOSIS — N289 Disorder of kidney and ureter, unspecified: Secondary | ICD-10-CM

## 2022-05-12 ENCOUNTER — Other Ambulatory Visit (INDEPENDENT_AMBULATORY_CARE_PROVIDER_SITE_OTHER): Payer: Medicare PPO

## 2022-05-12 DIAGNOSIS — N289 Disorder of kidney and ureter, unspecified: Secondary | ICD-10-CM

## 2022-05-13 LAB — BASIC METABOLIC PANEL
BUN: 21 mg/dL (ref 6–23)
CO2: 28 mEq/L (ref 19–32)
Calcium: 9.9 mg/dL (ref 8.4–10.5)
Chloride: 98 mEq/L (ref 96–112)
Creatinine, Ser: 1.18 mg/dL (ref 0.40–1.20)
GFR: 46.74 mL/min — ABNORMAL LOW (ref >60.00)
Glucose, Bld: 102 mg/dL — ABNORMAL HIGH (ref 70–99)
Potassium: 4.2 mEq/L (ref 3.5–5.1)
Sodium: 136 mEq/L (ref 135–145)

## 2022-05-15 ENCOUNTER — Encounter: Payer: Self-pay | Admitting: Internal Medicine

## 2022-05-17 ENCOUNTER — Other Ambulatory Visit: Payer: Self-pay | Admitting: Internal Medicine

## 2022-05-17 DIAGNOSIS — N1831 Chronic kidney disease, stage 3a: Secondary | ICD-10-CM

## 2022-06-12 ENCOUNTER — Other Ambulatory Visit: Payer: Self-pay | Admitting: Internal Medicine

## 2022-07-11 DIAGNOSIS — Z803 Family history of malignant neoplasm of breast: Secondary | ICD-10-CM | POA: Diagnosis not present

## 2022-07-11 DIAGNOSIS — Z1231 Encounter for screening mammogram for malignant neoplasm of breast: Secondary | ICD-10-CM | POA: Diagnosis not present

## 2022-07-11 LAB — HM MAMMOGRAPHY

## 2022-07-12 DIAGNOSIS — H35033 Hypertensive retinopathy, bilateral: Secondary | ICD-10-CM | POA: Diagnosis not present

## 2022-07-12 DIAGNOSIS — H524 Presbyopia: Secondary | ICD-10-CM | POA: Diagnosis not present

## 2022-07-12 DIAGNOSIS — H1045 Other chronic allergic conjunctivitis: Secondary | ICD-10-CM | POA: Diagnosis not present

## 2022-07-12 DIAGNOSIS — H35372 Puckering of macula, left eye: Secondary | ICD-10-CM | POA: Diagnosis not present

## 2022-07-12 DIAGNOSIS — I10 Essential (primary) hypertension: Secondary | ICD-10-CM | POA: Diagnosis not present

## 2022-07-12 DIAGNOSIS — H2513 Age-related nuclear cataract, bilateral: Secondary | ICD-10-CM | POA: Diagnosis not present

## 2022-07-19 DIAGNOSIS — R928 Other abnormal and inconclusive findings on diagnostic imaging of breast: Secondary | ICD-10-CM | POA: Diagnosis not present

## 2022-08-03 DIAGNOSIS — M170 Bilateral primary osteoarthritis of knee: Secondary | ICD-10-CM | POA: Diagnosis not present

## 2022-08-08 DIAGNOSIS — N1831 Chronic kidney disease, stage 3a: Secondary | ICD-10-CM | POA: Diagnosis not present

## 2022-08-08 DIAGNOSIS — R7303 Prediabetes: Secondary | ICD-10-CM | POA: Diagnosis not present

## 2022-08-08 DIAGNOSIS — R3129 Other microscopic hematuria: Secondary | ICD-10-CM | POA: Diagnosis not present

## 2022-08-08 DIAGNOSIS — I10 Essential (primary) hypertension: Secondary | ICD-10-CM | POA: Diagnosis not present

## 2022-09-01 ENCOUNTER — Encounter (INDEPENDENT_AMBULATORY_CARE_PROVIDER_SITE_OTHER): Payer: Self-pay

## 2022-09-27 IMAGING — US US THYROID
1 series · 13 of 25 positions shown · non-contrast
Comparison: Chest CT-earlier same day

CLINICAL DATA: Incidental on CT. Goiter incidentally noted on chest
CT

EXAM:
THYROID ULTRASOUND
TECHNIQUE: Ultrasound examination of the thyroid gland and adjacent soft
tissues was performed.

[Series 1: us thyroid · 39 acquisitions, 13 frames shown]
[im 1/39]
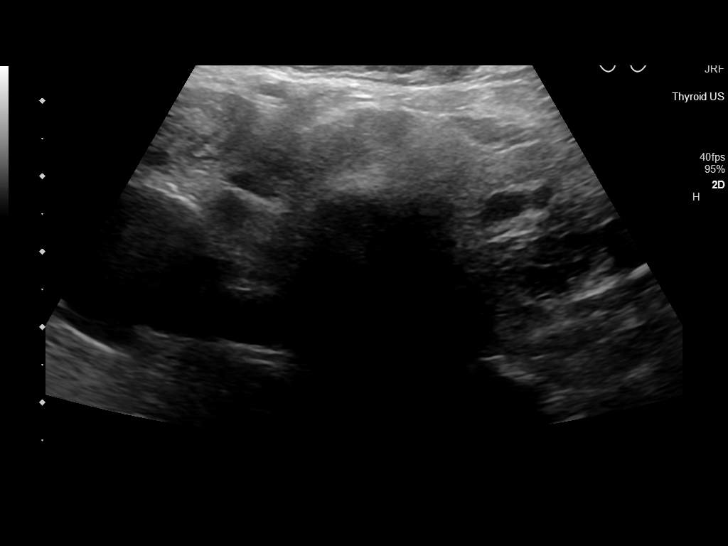
[im 4/39]
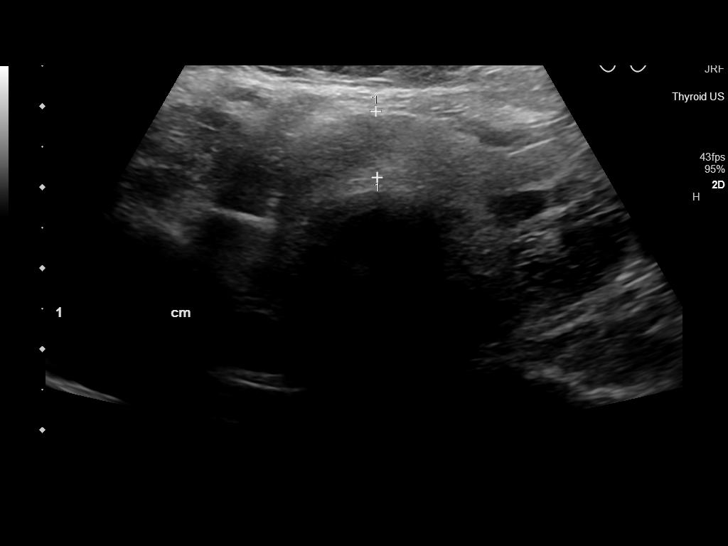
[im 7/39]
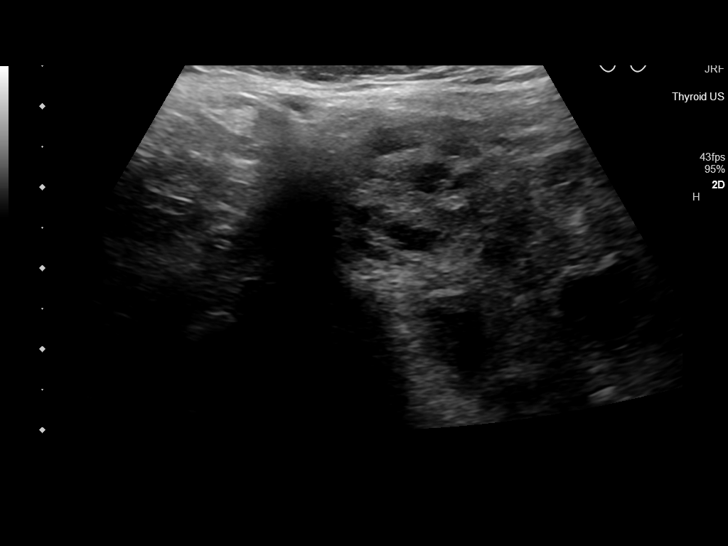
[im 10/39]
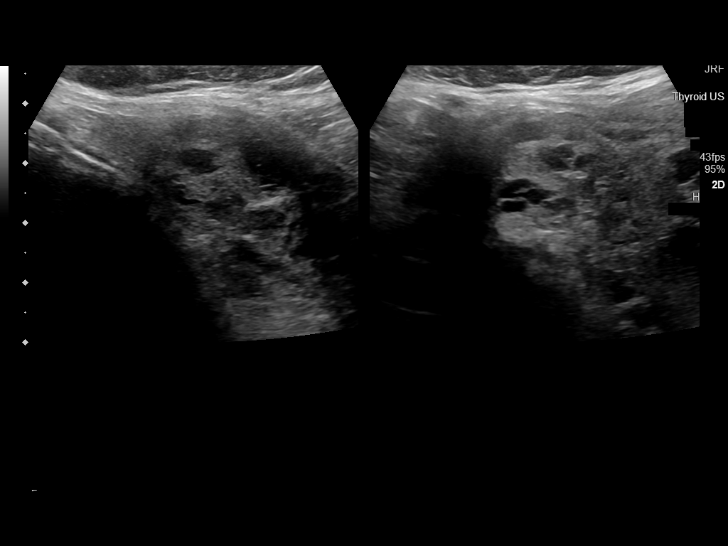
[im 13/39]
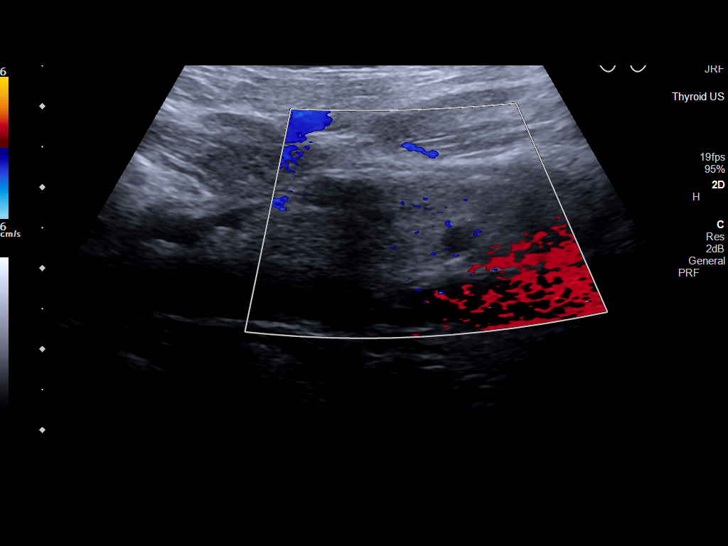
[im 16/39]
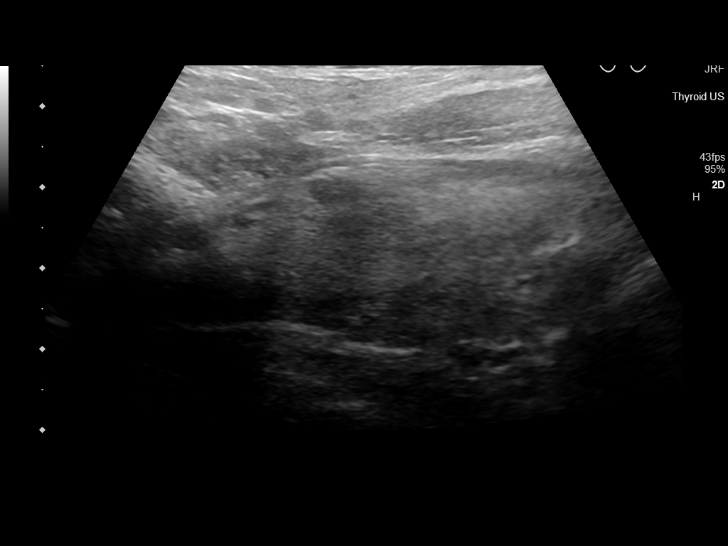
[im 20/39]
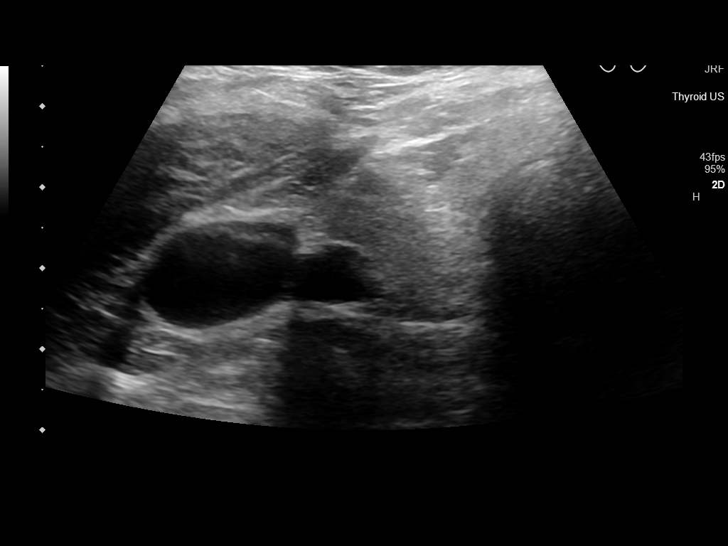
[im 23/39]
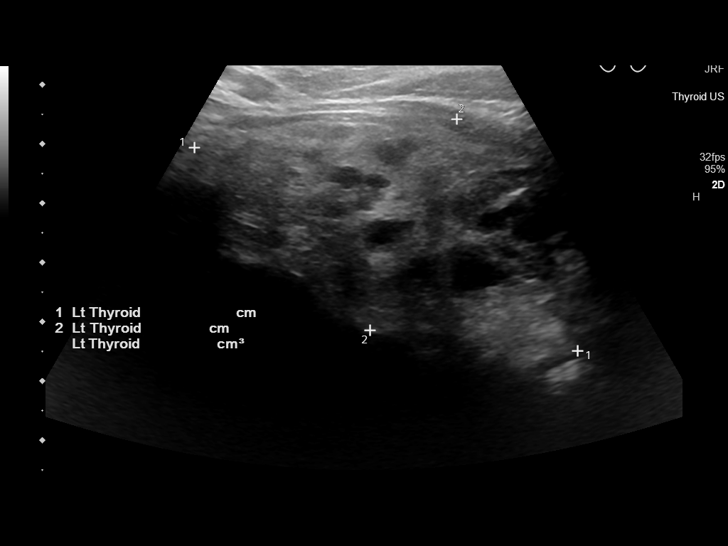
[im 26/39]
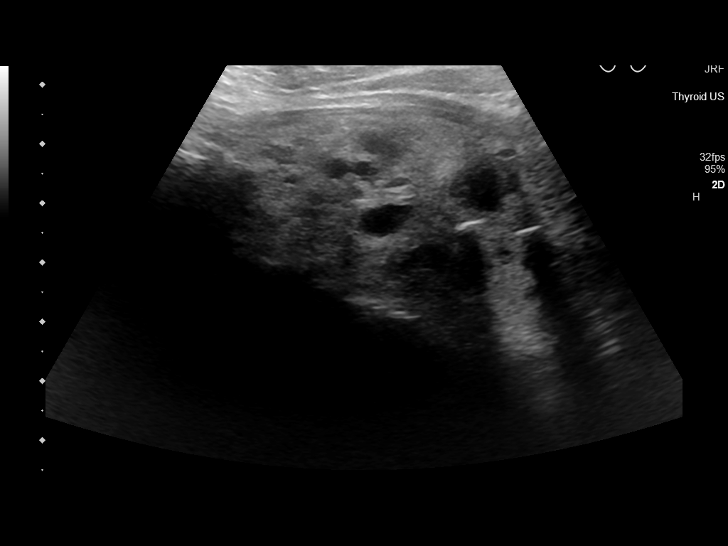
[im 29/39]
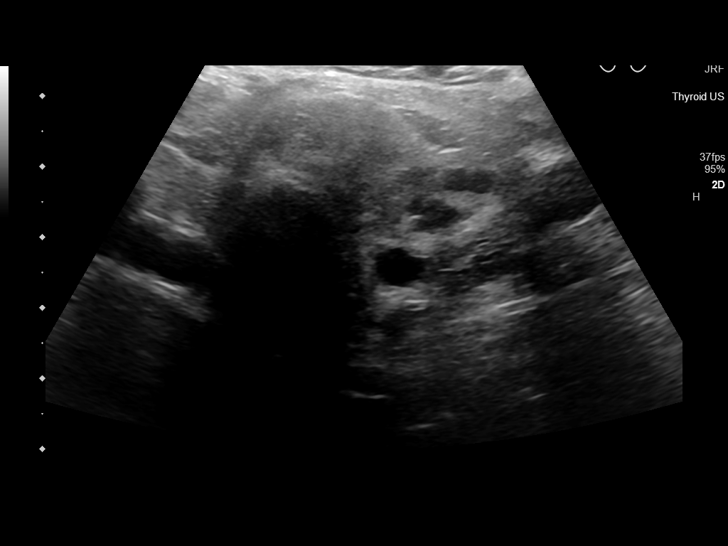
[im 32/39]
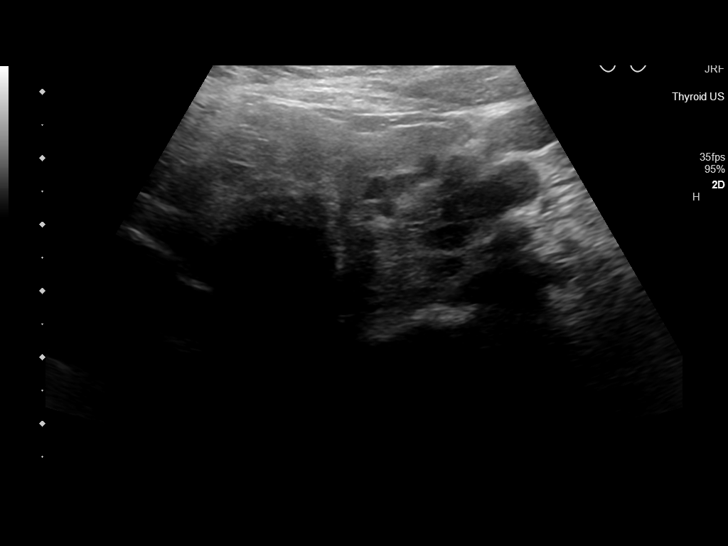
[im 35/39]
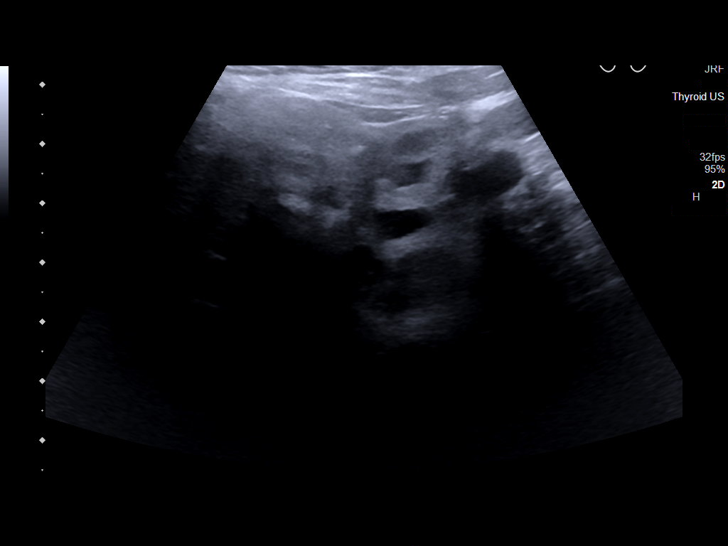
[im 39/39]
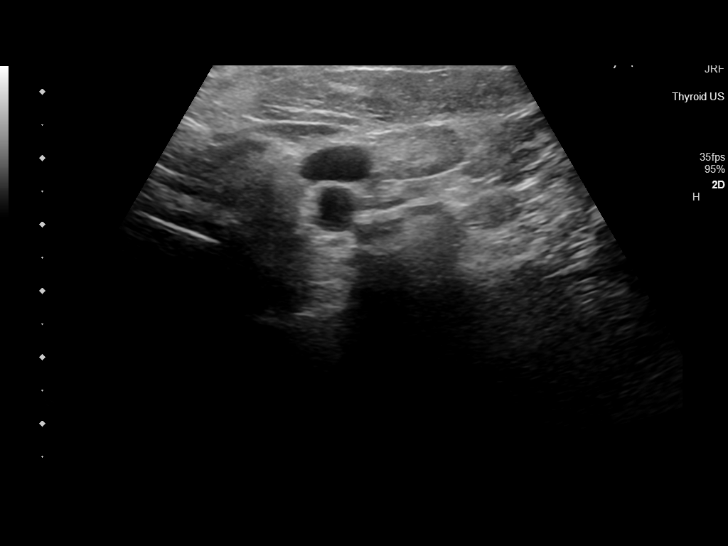

[13 of 25 positions shown; findings below may reference images not displayed]

FINDINGS: Parenchymal Echotexture: Mildly heterogenous

Isthmus: Borderline enlarged measures 0.8 cm in diameter

Right lobe: Slightly atrophic measuring 4.3 x 1.4 x 1.5 cm

Left lobe: Enlarged measuring 7.3 x 3.9 x 2.8 cm

_________________________________________________________

Estimated total number of nodules >/= 1 cm: 1

Number of spongiform nodules >/=  2 cm not described below (TR1): 0

Number of mixed cystic and solid nodules >/= 1.5 cm not described
below (TR2): 0

_________________________________________________________

Nodule # 1:

Location: Left; Inferior - this nodule correlates with the nodule
seen on preceding chest CT

Maximum size: 4.1 cm; Other 2 dimensions: 3.4 x 3.1 cm (nodule
estimated to measure at least 4.1 cm as there is significant
substernal extension demonstrated on preceding chest CT)

Composition: solid/almost completely solid (2)

Echogenicity: isoechoic (1)

Shape: taller-than-wide (3)

Margins: ill-defined (0)

Echogenic foci: none (0)

ACR TI-RADS total points: 6.

ACR TI-RADS risk category: TR4 (4-6 points).

ACR TI-RADS recommendations:

**Given size (>/= 1.5 cm) and appearance, fine needle aspiration of
this moderately suspicious nodule should be considered based on
TI-RADS criteria.

_________________________________________________________
IMPRESSION: Left-sided thyroid nodule/mass correlating with the nodule seen on
preceding chest CT meets imaging criteria to recommend percutaneous
sampling as indicated.

The above is in keeping with the ACR TI-RADS recommendations - [HOSPITAL] 5642;[DATE].

## 2022-10-03 ENCOUNTER — Ambulatory Visit (INDEPENDENT_AMBULATORY_CARE_PROVIDER_SITE_OTHER): Payer: Medicare PPO | Admitting: *Deleted

## 2022-10-03 VITALS — Ht 63.0 in | Wt 217.0 lb

## 2022-10-03 DIAGNOSIS — Z0000 Encounter for general adult medical examination without abnormal findings: Secondary | ICD-10-CM

## 2022-10-03 NOTE — Progress Notes (Signed)
Subjective:   Michele Garza is a 72 y.o. female who presents for Medicare Annual (Subsequent) preventive examination.  Visit Complete: Virtual  I connected with  Michele Garza on 10/03/22 by a audio enabled telemedicine application and verified that I am speaking with the correct person using two identifiers.  Patient Location: Home  Provider Location: Office/Clinic  I discussed the limitations of evaluation and management by telemedicine. The patient expressed understanding and agreed to proceed.  Patient Medicare AWV questionnaire was completed by the patient on 09/29/22; I have confirmed that all information answered by patient is correct and no changes since this date.  Vital Signs: Unable to obtain new vitals due to this being a telehealth visit.   Cardiac Risk Factors include: advanced age (>40men, >70 women);dyslipidemia;hypertension;obesity (BMI >30kg/m2)     Objective:    Today's Vitals   10/03/22 0856 10/03/22 0857  Weight: 217 lb (98.4 kg)   Height: 5\' 3"  (1.6 m)   PainSc:  6    Body mass index is 38.44 kg/m.     10/03/2022    9:16 AM 10/01/2021    9:29 AM 06/04/2021    9:38 AM 09/30/2020   11:38 AM 07/07/2020    3:01 PM 03/23/2020    5:48 PM 03/16/2020    2:26 AM  Advanced Directives  Does Patient Have a Medical Advance Directive? Yes Yes No Yes No No No  Type of Estate agent of [place];Living will Healthcare Power of [place];Living will  Healthcare Power of [place];Living will     Does patient want to make changes to medical advance directive?  No - Patient declined  No - Patient declined     Copy of Healthcare Power of Attorney in Chart? No - copy requested No - copy requested  No - copy requested     Would patient like information on creating a medical advance directive?   No - Patient declined  No - Patient declined  No - Patient declined    Current Medications (verified) Outpatient Encounter Medications as of 10/03/2022   Medication Sig   Acetaminophen (TYLENOL ARTHRITIS PAIN PO) Take by mouth.   atorvastatin (LIPITOR) 20 MG tablet Take 1 tablet (20 mg total) by mouth daily.   buPROPion (WELLBUTRIN XL) 150 MG 24 hr tablet TAKE 1 TABLET BY MOUTH EVERY DAY   Cholecalciferol (VITAMIN D3 PO) Take by mouth every evening.   ELIQUIS 5 MG TABS tablet TAKE 1 TABLET BY MOUTH TWICE A DAY   esomeprazole (NEXIUM) 20 MG capsule Take 20 mg by mouth daily as needed.   metoprolol succinate (TOPROL-XL) 50 MG 24 hr tablet TAKE 1 TABLET BY MOUTH EVERY DAY   Multiple Vitamin (MULTIVITAMIN) tablet Take 1 tablet by mouth every evening.   telmisartan (MICARDIS) 80 MG tablet Take 80 mg by mouth daily.   PREMARIN vaginal cream USE 1 TO 2 GRAMS INTRAVAGINALLY TWICE WEEKLY (Patient not taking: Reported on 10/03/2022)   [DISCONTINUED] fish oil-omega-3 fatty acids 1000 MG capsule Take 2 g by mouth every evening.   [DISCONTINUED] losartan (COZAAR) 50 MG tablet Take 1 tablet (50 mg total) by mouth daily.   No facility-administered encounter medications on file as of 10/03/2022.    Allergies (verified) Clindamycin/lincomycin   History: Past Medical History:  Diagnosis Date   Anxiety    Depression    Diverticulosis of colon    one prior episode July 2011 caused by tomato overingestion   GERD (gastroesophageal reflux disease)    History of colonoscopy  done in  dec 2011, repeat due 5 yrs due to [facility]   Hypertension    Pulmonary emboli (HCC) 03/16/2020   Past Surgical History:  Procedure Laterality Date   BREAST SURGERY     sterotactic breaswt biopsy: atypical cells,  continued diagnostic mammograms   COLONOSCOPY     COLONOSCOPY WITH PROPOFOL N/A 02/21/2020   Procedure: COLONOSCOPY WITH PROPOFOL;  Surgeon: Pasty Spillers, MD;  Location: [facility] ENDOSCOPY;  Service: Endoscopy;  Laterality: N/A;   PULMONARY THROMBECTOMY Bilateral 03/17/2020   Procedure: PULMONARY THROMBECTOMY / THROMBOLYSIS;  Surgeon: Renford Dills, MD;   Location: [facility] INVASIVE CV LAB;  Service: Cardiovascular;  Laterality: Bilateral;   THYROIDECTOMY, PARTIAL  01/27/2022   Family History  Problem Relation Age of Onset   Cancer Mother        bilatera, contralateral recurred after 30 yrs   Stroke Father    Heart disease Father 32       died of stroke  at 16, tobacco user   Cancer Father        colon Ca   Cancer Maternal Uncle        breast x 2 aunts   Cancer Paternal Grandmother 72       metastatic colon Ca   Social History   Socioeconomic History   Marital status: Divorced    Spouse name: Not on file   Number of children: Not on file   Years of education: Not on file   Highest education level: Some college, no degree  Occupational History   Occupation: Surveyor, minerals: [employer]    Comment: 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[place])  Tobacco Use   Smoking status: Never   Smokeless tobacco: Never  Vaping Use   Vaping status: Never Used  Substance and Sexual Activity   Alcohol use: Yes    Alcohol/week: 1.0 standard drink of alcohol    Types: 1 Shots of liquor per week    Comment: occasional   Drug use: No   Sexual activity: Not on file  Other Topics Concern   Not on file  Social History Narrative   Divorced   Social Determinants of Health   Financial Resource Strain: Low Risk  (09/29/2022)   Overall Financial Resource Strain (CARDIA)    Difficulty of Paying Living Expenses: Not hard at all  Food Insecurity: No Food Insecurity (09/29/2022)   Hunger Vital Sign    Worried About Running Out of Food in the Last Year: Never true    Ran Out of Food in the Last Year: Never true  Transportation Needs: No Transportation Needs (09/29/2022)   PRAPARE - Administrator, Civil Service (Medical): No    Lack of Transportation (Non-Medical): No  Physical Activity: Insufficiently Active (09/29/2022)   Exercise Vital Sign    Days of Exercise per Week: 2 days    Minutes of Exercise per Session: 10 min   Stress: No Stress Concern Present (09/29/2022)   [employer] of Occupational Health - Occupational Stress Questionnaire    Feeling of Stress : Only a little  Social Connections: Moderately Isolated (09/29/2022)   Social Connection and Isolation Panel [NHANES]    Frequency of Communication with Friends and Family: Three times a week    Frequency of Social Gatherings with Friends and Family: Once a week    Attends Religious Services: 1 to 4 times per year    Active Member of Clubs or Organizations: No  Attends Banker Meetings: Never    Marital Status: Divorced    Tobacco Counseling Counseling given: Not Answered   Clinical Intake:  Pre-visit preparation completed: Yes  Pain : 0-10 Pain Score: 6  Pain Type: Chronic pain Pain Location: Knee Pain Descriptors / Indicators: Aching, Dull Pain Onset: More than a month ago Pain Frequency: Intermittent     BMI - recorded: 38.44 Nutritional Status: BMI > 30  Obese Nutritional Risks: None Diabetes: No  How often do you need to have someone help you when you read instructions, pamphlets, or other written materials from your doctor or pharmacy?: 1 - Never  Interpreter Needed?: No  Information entered by :: R. Elida Harbin LPN   Activities of Daily Living    09/29/2022    1:56 PM  In your present state of health, do you have any difficulty performing the following activities:  Hearing? 0  Vision? 0  Difficulty concentrating or making decisions? 0  Walking or climbing stairs? 1  Dressing or bathing? 0  Doing errands, shopping? 0  Preparing Food and eating ? N  Using the Toilet? N  In the past six months, have you accidently leaked urine? N  Do you have problems with loss of bowel control? N  Managing your Medications? N  Managing your Finances? N  Housekeeping or managing your Housekeeping? N    Patient Care Team: Sherlene Shams, MD as PCP - General (Internal Medicine)  Indicate any recent Medical  Services you may have received from other than [facility] providers in the past year (date may be approximate).     Assessment:   This is a routine wellness examination for Yoshiko.  Hearing/Vision screen Hearing Screening - Comments:: No issues Vision Screening - Comments:: Contacts and glasses   Goals Addressed             This Visit's Progress    Patient Stated       Wants to try to lose some weight and exercise, Plans on getting something done about her knees       Depression Screen    10/03/2022    9:08 AM 05/04/2022    9:45 AM 01/03/2022    4:50 PM 10/01/2021    9:29 AM 09/23/2021   10:15 AM 06/18/2021   10:38 AM 11/30/2020    2:07 PM  PHQ 2/9 Scores  PHQ - 2 Score 1 2 0 2 2 1 1   PHQ- 9 Score 1 3 0 3 3 3      Fall Risk    09/29/2022    1:56 PM 05/04/2022    9:45 AM 10/01/2021    9:39 AM 09/23/2021    9:41 AM 06/18/2021    9:43 AM  Fall Risk   Falls in the past year? 1 1 0 0 0  Number falls in past yr: 0 0 0    Injury with Fall? 0 0     Risk for fall due to : History of fall(s);Other (Comment) History of fall(s)  No Fall Risks No Fall Risks  Risk for fall due to: Comment passed out      Follow up Falls evaluation completed;Falls prevention discussed Falls evaluation completed Falls evaluation completed Falls evaluation completed Falls evaluation completed    MEDICARE RISK AT HOME: Medicare Risk at Home Any stairs in or around the home?: No If so, are there any without handrails?: No Home free of loose throw rugs in walkways, pet beds, electrical cords, etc?: Yes Adequate lighting in  your home to reduce risk of falls?: Yes Life alert?: No Use of a cane, walker or w/c?: No Grab bars in the bathroom?: No Shower chair or bench in shower?: Yes Elevated toilet seat or a handicapped toilet?: No   Cognitive Function:    08/07/2019   12:19 PM  MMSE - Mini Mental State Exam  Not completed: Unable to complete        10/03/2022    9:17 AM 10/01/2021    9:40 AM 08/06/2018    12:15 PM  6CIT Screen  What Year? 0 points 0 points 0 points  What month? 0 points 0 points 0 points  What time? 0 points 0 points 0 points  Count back from 20 0 points 0 points 0 points  Months in reverse 0 points 0 points 0 points  Repeat phrase 0 points 0 points 0 points  Total Score 0 points 0 points 0 points    Immunizations Immunization History  Administered Date(s) Administered   Fluad Quad(high Dose 65+) 11/04/2019, 11/30/2020   Influenza,inj,Quad PF,6+ Mos 12/08/2017, 09/07/2018, 01/28/2022   PFIZER(Purple Top)SARS-COV-2 Vaccination 02/08/2019, 03/01/2019, 10/15/2019   Pneumococcal Conjugate-13 04/28/2017   Tdap 02/28/2013   Zoster Recombinant(Shingrix) 07/26/2021, 10/04/2021    TDAP status: Up to date  Flu Vaccine status: Due, Education has been provided regarding the importance of this vaccine. Advised may receive this vaccine at local pharmacy or Health Dept. Aware to provide a copy of the vaccination record if obtained from local pharmacy or Health Dept. Verbalized acceptance and understanding.  Pneumococcal vaccine status: Due, Education has been provided regarding the importance of this vaccine. Advised may receive this vaccine at local pharmacy or Health Dept. Aware to provide a copy of the vaccination record if obtained from local pharmacy or Health Dept. Verbalized acceptance and understanding.  Covid-19 vaccine status: Information provided on how to obtain vaccines.   Qualifies for Shingles Vaccine? Yes   Zostavax completed No   Shingrix Completed?: Yes  Screening Tests Health Maintenance  Topic Date Due   Pneumonia Vaccine 40+ Years old (2 of 2 - PPSV23 or PCV20) 04/29/2018   INFLUENZA VACCINE  08/18/2022   COVID-19 Vaccine (4 - 2023-24 season) 09/18/2022   Medicare Annual Wellness (AWV)  10/02/2022   DTaP/Tdap/Td (2 - Td or Tdap) 03/01/2023   MAMMOGRAM  07/11/2023   Colonoscopy  02/20/2025   DEXA SCAN  Completed   Hepatitis C Screening  Completed    Zoster Vaccines- Shingrix  Completed   HPV VACCINES  Aged Out    Health Maintenance  Health Maintenance Due  Topic Date Due   Pneumonia Vaccine 64+ Years old (2 of 2 - PPSV23 or PCV20) 04/29/2018   INFLUENZA VACCINE  08/18/2022   COVID-19 Vaccine (4 - 2023-24 season) 09/18/2022   Medicare Annual Wellness (AWV)  10/02/2022    Colorectal cancer screening: Type of screening: Colonoscopy. Completed 2/22. Repeat every 5 years  Mammogram status: Completed 2/24. Repeat every year  Bone Density status: Completed 6/19. Results reflect: Bone density results: NORMAL. Repeat every 2 years. Wants to discuss with PCP at next visit  Lung Cancer Screening: (Low Dose CT Chest recommended if Age 50-80 years, 20 pack-year currently smoking OR have quit w/in 15years.) does not qualify.     Additional Screening:  Hepatitis C Screening: does qualify; Completed 3/17  Vision Screening: Recommended annual ophthalmology exams for early detection of glaucoma and other disorders of the eye. Is the patient up to date with their annual eye exam?  Yes  Who is the provider or what is the name of the office in which the patient attends annual eye exams? Dr. Larence Penning If pt is not established with a provider, would they like to be referred to a provider to establish care? No .   Dental Screening: Recommended annual dental exams for proper oral hygiene   Community Resource Referral / Chronic Care Management: CRR required this visit?  No   CCM required this visit?  No     Plan:     I have personally reviewed and noted the following in the patient's chart:   Medical and social history Use of alcohol, tobacco or illicit drugs  Current medications and supplements including opioid prescriptions. Patient is not currently taking opioid prescriptions. Functional ability and status Nutritional status Physical activity Advanced directives List of other physicians Hospitalizations, surgeries, and ER visits  in previous 12 months Vitals Screenings to include cognitive, depression, and falls Referrals and appointments  In addition, I have reviewed and discussed with patient certain preventive protocols, quality metrics, and best practice recommendations. A written personalized care plan for preventive services as well as general preventive health recommendations were provided to patient.     Sydell Axon, LPN   08/27/9145   After Visit Summary: (MyChart) Due to this being a telephonic visit, the after visit summary with patients personalized plan was offered to patient via MyChart   Nurse Notes: None

## 2022-10-03 NOTE — Patient Instructions (Signed)
Michele Garza , Thank you for taking time to come for your Medicare Wellness Visit. I appreciate your ongoing commitment to your health goals. Please review the following plan we discussed and let me know if I can assist you in the future.   Referrals/Orders/Follow-Ups/Clinician Recommendations: None  This is a list of the screening recommended for you and due dates:  Health Maintenance  Topic Date Due   Pneumonia Vaccine (2 of 2 - PPSV23 or PCV20) 04/29/2018   Flu Shot  08/18/2022   COVID-19 Vaccine (4 - 2023-24 season) 09/18/2022   DTaP/Tdap/Td vaccine (2 - Td or Tdap) 03/01/2023   Mammogram  07/11/2023   Medicare Annual Wellness Visit  10/03/2023   Colon Cancer Screening  02/20/2025   DEXA scan (bone density measurement)  Completed   Hepatitis C Screening  Completed   Zoster (Shingles) Vaccine  Completed   HPV Vaccine  Aged Out    Advanced directives: (Copy Requested) Please bring a copy of your health care power of attorney and living will to the office to be added to your chart at your convenience.  Next Medicare Annual Wellness Visit scheduled for next year: Yes 10/10/23 @ 9:40

## 2022-10-06 ENCOUNTER — Other Ambulatory Visit: Payer: Self-pay | Admitting: Internal Medicine

## 2022-10-27 ENCOUNTER — Other Ambulatory Visit: Payer: Self-pay | Admitting: Internal Medicine

## 2022-11-11 ENCOUNTER — Ambulatory Visit: Payer: Medicare PPO | Admitting: Internal Medicine

## 2022-11-15 ENCOUNTER — Ambulatory Visit: Payer: Medicare PPO | Admitting: Internal Medicine

## 2022-11-18 ENCOUNTER — Encounter: Payer: Self-pay | Admitting: Internal Medicine

## 2022-11-18 ENCOUNTER — Ambulatory Visit: Payer: Medicare PPO | Admitting: Internal Medicine

## 2022-11-18 VITALS — BP 150/84 | HR 88 | Ht 63.0 in | Wt 219.0 lb

## 2022-11-18 DIAGNOSIS — I10 Essential (primary) hypertension: Secondary | ICD-10-CM

## 2022-11-18 DIAGNOSIS — R7301 Impaired fasting glucose: Secondary | ICD-10-CM | POA: Diagnosis not present

## 2022-11-18 DIAGNOSIS — Z6841 Body Mass Index (BMI) 40.0 and over, adult: Secondary | ICD-10-CM

## 2022-11-18 DIAGNOSIS — G8929 Other chronic pain: Secondary | ICD-10-CM

## 2022-11-18 DIAGNOSIS — Z0000 Encounter for general adult medical examination without abnormal findings: Secondary | ICD-10-CM | POA: Diagnosis not present

## 2022-11-18 DIAGNOSIS — Z23 Encounter for immunization: Secondary | ICD-10-CM

## 2022-11-18 DIAGNOSIS — E049 Nontoxic goiter, unspecified: Secondary | ICD-10-CM

## 2022-11-18 DIAGNOSIS — I700 Atherosclerosis of aorta: Secondary | ICD-10-CM

## 2022-11-18 DIAGNOSIS — E7800 Pure hypercholesterolemia, unspecified: Secondary | ICD-10-CM

## 2022-11-18 DIAGNOSIS — Z8619 Personal history of other infectious and parasitic diseases: Secondary | ICD-10-CM | POA: Diagnosis not present

## 2022-11-18 DIAGNOSIS — M25561 Pain in right knee: Secondary | ICD-10-CM | POA: Diagnosis not present

## 2022-11-18 LAB — CBC WITH DIFFERENTIAL/PLATELET
Basophils Absolute: 0 10*3/uL (ref 0.0–0.1)
Basophils Relative: 0.6 % (ref 0.0–3.0)
Eosinophils Absolute: 0.1 10*3/uL (ref 0.0–0.7)
Eosinophils Relative: 1 % (ref 0.0–5.0)
HCT: 39.8 % (ref 36.0–46.0)
Hemoglobin: 13 g/dL (ref 12.0–15.0)
Lymphocytes Relative: 34.2 % (ref 12.0–46.0)
Lymphs Abs: 2.5 10*3/uL (ref 0.7–4.0)
MCHC: 32.7 g/dL (ref 30.0–36.0)
MCV: 95.7 fL (ref 78.0–100.0)
Monocytes Absolute: 0.7 10*3/uL (ref 0.1–1.0)
Monocytes Relative: 9.6 % (ref 3.0–12.0)
Neutro Abs: 4 10*3/uL (ref 1.4–7.7)
Neutrophils Relative %: 54.6 % (ref 43.0–77.0)
Platelets: 220 10*3/uL (ref 150.0–400.0)
RBC: 4.16 Mil/uL (ref 3.87–5.11)
RDW: 13.2 % (ref 11.5–15.5)
WBC: 7.3 10*3/uL (ref 4.0–10.5)

## 2022-11-18 LAB — COMPREHENSIVE METABOLIC PANEL
ALT: 16 U/L (ref 0–35)
AST: 19 U/L (ref 0–37)
Albumin: 4.3 g/dL (ref 3.5–5.2)
Alkaline Phosphatase: 51 U/L (ref 39–117)
BUN: 19 mg/dL (ref 6–23)
CO2: 28 meq/L (ref 19–32)
Calcium: 10 mg/dL (ref 8.4–10.5)
Chloride: 101 meq/L (ref 96–112)
Creatinine, Ser: 1.07 mg/dL (ref 0.40–1.20)
GFR: 52.38 mL/min — ABNORMAL LOW (ref >60.00)
Glucose, Bld: 105 mg/dL — ABNORMAL HIGH (ref 70–99)
Potassium: 3.6 meq/L (ref 3.5–5.1)
Sodium: 139 meq/L (ref 135–145)
Total Bilirubin: 0.6 mg/dL (ref 0.2–1.2)
Total Protein: 7.1 g/dL (ref 6.0–8.3)

## 2022-11-18 LAB — TSH: TSH: 2.12 u[IU]/mL (ref 0.35–5.50)

## 2022-11-18 LAB — LIPID PANEL
Cholesterol: 187 mg/dL (ref 0–200)
HDL: 55.1 mg/dL (ref >39.00)
LDL Cholesterol: 112 mg/dL — ABNORMAL HIGH (ref 0–99)
NonHDL: 131.47
Total CHOL/HDL Ratio: 3
Triglycerides: 98 mg/dL (ref 0.0–149.0)
VLDL: 19.6 mg/dL (ref 0.0–40.0)

## 2022-11-18 LAB — LDL CHOLESTEROL, DIRECT: Direct LDL: 110 mg/dL

## 2022-11-18 LAB — HEMOGLOBIN A1C: Hgb A1c MFr Bld: 5.8 % (ref 4.6–6.5)

## 2022-11-18 MED ORDER — ACYCLOVIR 400 MG PO TABS: 400.0000 mg | ORAL_TABLET | Freq: Every day | ORAL | 2 refills | Status: AC

## 2022-11-18 NOTE — Progress Notes (Unsigned)
Subjective:  Patient ID: Michele Garza, female    DOB: Mar 23, 1950  Age: 72 y.o. MRN: [id number]  CC: The primary encounter diagnosis was Primary hypertension. Diagnoses of Morbid obesity with BMI of 40.0-44.9, adult (HCC), Pure hypercholesterolemia, and Impaired fasting glucose were also pertinent to this visit.   HPI Michele Garza presents for  Chief Complaint  Patient presents with   Medical Management of Chronic Issues    6 month follow up s   1) Bilateral knee pain secondary to arthritis, has flares.  Gets 30 days of relief with cortisone injections from Dr Kathryne Hitch   2) had increased GERD esophagitis with increased dose of Wegovy (1.0) . Has been off of it for months due to cost   3) obesity:  ability to exercise is severely limited by orthopedic issues    Lab Results  Component Value Date   HGBA1C 5.8 05/04/2022     Outpatient Medications Prior to Visit  Medication Sig Dispense Refill   Acetaminophen (TYLENOL ARTHRITIS PAIN PO) Take by mouth.     atorvastatin (LIPITOR) 20 MG tablet Take 1 tablet (20 mg total) by mouth daily. 90 tablet 3   buPROPion (WELLBUTRIN XL) 150 MG 24 hr tablet TAKE 1 TABLET BY MOUTH EVERY DAY 90 tablet 1   Cholecalciferol (VITAMIN D3 PO) Take by mouth every evening.     ELIQUIS 5 MG TABS tablet TAKE 1 TABLET BY MOUTH TWICE A DAY 60 tablet 5   esomeprazole (NEXIUM) 20 MG capsule Take 20 mg by mouth daily as needed.     metoprolol succinate (TOPROL-XL) 50 MG 24 hr tablet TAKE 1 TABLET BY MOUTH EVERY DAY 90 tablet 1   Multiple Vitamin (MULTIVITAMIN) tablet Take 1 tablet by mouth every evening.     telmisartan (MICARDIS) 80 MG tablet Take 80 mg by mouth daily.     PREMARIN vaginal cream USE 1 TO 2 GRAMS INTRAVAGINALLY TWICE WEEKLY (Patient not taking: Reported on 10/03/2022) 30 g 2   No facility-administered medications prior to visit.    Review of Systems;  Patient denies headache, fevers, malaise, unintentional weight loss, skin rash, eye pain,  sinus congestion and sinus pain, sore throat, dysphagia,  hemoptysis , cough, dyspnea, wheezing, chest pain, palpitations, orthopnea, edema, abdominal pain, nausea, melena, diarrhea, constipation, flank pain, dysuria, hematuria, urinary  Frequency, nocturia, numbness, tingling, seizures,  Focal weakness, Loss of consciousness,  Tremor, insomnia, depression, anxiety, and suicidal ideation.      Objective:  BP (!) 150/84   Pulse 88   Ht 5\' 3"  (1.6 m)   Wt 219 lb (99.3 kg)   SpO2 98%   BMI 38.79 kg/m   BP Readings from Last 3 Encounters:  11/18/22 (!) 150/84  05/04/22 130/80  09/23/21 130/86    Wt Readings from Last 3 Encounters:  11/18/22 219 lb (99.3 kg)  10/03/22 217 lb (98.4 kg)  05/04/22 214 lb 9.6 oz (97.3 kg)    Physical Exam  Lab Results  Component Value Date   HGBA1C 5.8 05/04/2022   HGBA1C 5.7 06/18/2021   HGBA1C 5.9 11/30/2020    Lab Results  Component Value Date   CREATININE 1.18 05/12/2022   CREATININE 1.06 05/04/2022   CREATININE 0.93 01/21/2022    Lab Results  Component Value Date   WBC 8.3 01/21/2022   HGB 13.6 01/21/2022   HCT 40.8 01/21/2022   PLT 264.0 01/21/2022   GLUCOSE 102 (H) 05/12/2022   CHOL 185 05/04/2022   TRIG 63.0 05/04/2022   HDL  63.20 05/04/2022   LDLDIRECT 113.0 05/04/2022   LDLCALC 110 (H) 05/04/2022   ALT 19 05/04/2022   AST 19 05/04/2022   NA 136 05/12/2022   K 4.2 05/12/2022   CL 98 05/12/2022   CREATININE 1.18 05/12/2022   BUN 21 05/12/2022   CO2 28 05/12/2022   TSH 2.21 05/04/2022   INR 1.1 03/16/2020   HGBA1C 5.8 05/04/2022   MICROALBUR 0.9 05/04/2022    CT Angio Chest W/Cm &/Or Wo Cm  Result Date: 01/20/2021 CLINICAL DATA:  History of pulmonary embolism treated with thrombectomy. Currently on anticoagulation. Evaluate for acute or chronic pulmonary embolism. EXAM: CT ANGIOGRAPHY CHEST WITH CONTRAST TECHNIQUE: Multidetector CT imaging of the chest was performed using the standard protocol during bolus  administration of intravenous contrast. Multiplanar CT image reconstructions and MIPs were obtained to evaluate the vascular anatomy. CONTRAST:  75mL OMNIPAQUE IOHEXOL 350 MG/ML SOLN COMPARISON:  Chest CTA-03/16/2020 Thyroid ultrasound-03/16/2020 FINDINGS: Vascular Findings: There is adequate opacification of the pulmonary arterial system with the main pulmonary artery measuring 300 Hounsfield units. There are no discrete filling defects within the pulmonary arterial tree to suggest acute or chronic pulmonary embolism. Normal caliber of the main pulmonary artery. Cardiomegaly. No pericardial effusion. Coronary artery calcifications. Scattered atherosclerotic plaque within a normal caliber thoracic aorta. Conventional configuration of the aortic arch. The descending thoracic aorta is mildly tortuous but of normal caliber. Review of the MIP images confirms the above findings. ---------------------------------------------------------------------------------- Nonvascular Findings: Mediastinum/Lymph Nodes: No bulky mediastinal, hilar or axillary lymphadenopathy. Lungs/Pleura: Minimal dependent subpleural ground-glass atelectasis. No discrete focal airspace opacities. No pleural effusion or pneumothorax. The central pulmonary airways appear widely patent. No discrete pulmonary nodules. Upper abdomen: Limited early arterial phase evaluation of the upper abdomen suggests a small hiatal hernia. Scattered colonic diverticulosis without evidence of superimposed acute diverticulitis. Previously noted trace perihepatic ascites and stranding at the level of the porta hepatis has apparently resolved in the interval. Musculoskeletal: No acute or aggressive osseous abnormalities. 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 Asymmetric enlargement of the left lobe of the thyroid with scattered dystrophic calcifications and mass effect upon the tracheal air column at the level of the thoracic inlet  (representative image 18, series 4), similar to chest CT performed 03/16/2020 as well as further evaluated on dedicated thyroid ultrasound also performed on 03/16/2020. IMPRESSION: 1. No evidence of acute or chronic pulmonary embolism. 2. Cardiomegaly. 3. Coronary artery calcifications. Aortic Atherosclerosis (ICD10-I70.0). 4. Redemonstrated asymmetric enlargement of the left lobe of the thyroid as demonstrated on both previous chest CT and dedicated thyroid ultrasound performed 03/16/2020. If recommended ultrasound-guided fine-needle aspiration is not been performed, repeat diagnostic thyroid ultrasound is recommended. Electronically Signed   By: Simonne Come M.D.   On: 01/20/2021 09:37    Assessment & Plan:  .Primary hypertension -     Comprehensive metabolic panel  Morbid obesity with BMI of 40.0-44.9, adult (HCC) -     Comprehensive metabolic panel -     TSH -     Hemoglobin A1c -     CBC with Differential/Platelet  Pure hypercholesterolemia -     Lipid panel -     LDL cholesterol, direct  Impaired fasting glucose -     Comprehensive metabolic panel -     Hemoglobin A1c     I provided 30 minutes of face-to-face time during this encounter reviewing patient's last visit with me, patient's  most recent visit with cardiology,  nephrology,  and neurology,  recent surgical and non surgical procedures, previous  labs and imaging studies, counseling on currently addressed issues,  and post visit ordering to diagnostics and therapeutics .   Follow-up: No follow-ups on file.   Sherlene Shams, MD

## 2022-11-18 NOTE — Patient Instructions (Addendum)
Send me a message about the orthopedic referral (who you want to see at [facility]) for your knees   Here are some local and online Sources for zepbound and Wegovy (direct pay)   [employer] CASH PAY FOR [facility]evelyn Roy[facility] [place], [place] - [phone]  Isaac Bliss  Drug store in [place] makes a compounded version of semiglutide [employer] (online  GLP)  Sherilyn Cooter Meds:  (Online GLP )  Delrae Rend MD in [place] also provides medication through a [employer] program

## 2022-11-19 ENCOUNTER — Encounter: Payer: Self-pay | Admitting: Internal Medicine

## 2022-11-19 NOTE — Assessment & Plan Note (Signed)
Acyclovir refilled for future prn use

## 2022-11-19 NOTE — Assessment & Plan Note (Signed)
She would like to resume GLP 1 agonist but had incresed GERD with Wegovy at 0.5 mg dose and insurance is no longer paying for medication.  Online sources and compounding pharmacies discussed.

## 2022-11-19 NOTE — Assessment & Plan Note (Signed)
She is managing with periodic  I/A kcorticosteroid injections,  with diminishing returns.  She has reqquested referral to [facility] orthopeidcs to consider knee replacement

## 2022-11-19 NOTE — Assessment & Plan Note (Signed)
..  Reviewed reason for statin .   Patient is tolerating high potency statin therapy with atorvastatin  20 mg daiy

## 2022-11-19 NOTE — Progress Notes (Signed)
Patient ID: Michele Garza, female    DOB: December 15, 1950  Age: 72 y.o. MRN: [id number]  The patient is here for annual [rpreventive examination and management of other chronic and acute problems.   The risk factors are reflected in the social history.  The roster of all physicians providing medical care to patient - is listed in the Snapshot section of the chart.  Activities of daily living:  The patient is 100% independent in all ADLs: dressing, toileting, feeding as well as independent mobility  Home safety : The patient has smoke detectors in the home. They wear seatbelts.  There are no firearms at home. There is no violence in the home.   There is no risks for hepatitis, STDs or HIV. There is no   history of blood transfusion. They have no travel history to infectious disease endemic areas of the world.  The patient has seen their dentist in the last six month. They have seen their eye doctor in the last year. They admit to slight hearing difficulty with regard to whispered voices and some television programs.  They have deferred audiologic testing in the last year.  They do not  have excessive sun exposure. Discussed the need for sun protection: hats, long sleeves and use of sunscreen if there is significant sun exposure.   Diet: the importance of a healthy diet is discussed. They do have a healthy diet.  The benefits of regular aerobic exercise were discussed. She  is not exercising  due to knee pain .   Depression screen: there are no signs or vegative symptoms of depression- irritability, change in appetite, anhedonia, sadness/tearfullness.  Cognitive assessment: the patient manages all their financial and personal affairs and is actively engaged. They could relate day,date,year and events; recalled 2/3 objects at 3 minutes; performed clock-face test normally.  The following portions of the patient's history were reviewed and updated as appropriate: allergies, current medications, past  family history, past medical history,  past surgical history, past social history  and problem list.  Visual acuity was not assessed per patient preference since she has regular follow up with her ophthalmologist. Hearing and body mass index were assessed and reviewed.   During the course of the visit the patient was educated and counseled about appropriate screening and preventive services including : fall prevention , diabetes screening, nutrition counseling, colorectal cancer screening, and recommended immunizations.    CC: The primary encounter diagnosis was Primary hypertension. Diagnoses of Morbid obesity with BMI of 40.0-44.9, adult (HCC), Pure hypercholesterolemia, Impaired fasting glucose, Need for pneumococcal 20-valent conjugate vaccination, Need for influenza vaccination, Encounter for preventive health examination, History of herpes zoster, White coat syndrome with diagnosis of hypertension, Chronic pain of right knee, Aortic atherosclerosis (HCC), and Retrosternal goiter were also pertinent to this visit.  ) Bilateral knee pain secondary to arthritis, has flares.  Gets 30 days of relief with cortisone injections from Dr Kathryne Hitch   2) had increased GERD esophagitis with increased dose of Wegovy (1.0) . Has been off of it for months due to cost   3) obesity:  she would like to lose weight but is unable  to exercise due to her multiple orthopedic issues    History Michele Garza has a past medical history of Anxiety, Depression, Diverticulosis of colon, GERD (gastroesophageal reflux disease), History of colonoscopy, Hypertension, and Pulmonary emboli (HCC) (03/16/2020).   She has a past surgical history that includes Breast surgery; Colonoscopy; Colonoscopy with propofol (N/A, 02/21/2020); PULMONARY THROMBECTOMY (Bilateral, 03/17/2020); and Thyroidectomy,  partial (01/27/2022).   Her family history includes Cancer in her father, maternal uncle, and mother; Cancer (age of onset: 51) in her paternal  grandmother; Heart disease (age of onset: 33) in her father; Stroke in her father.She reports that she has never smoked. She has never used smokeless tobacco. She reports current alcohol use of about 1.0 standard drink of alcohol per week. She reports that she does not use drugs.  Outpatient Medications Prior to Visit  Medication Sig Dispense Refill   Acetaminophen (TYLENOL ARTHRITIS PAIN PO) Take by mouth.     atorvastatin (LIPITOR) 20 MG tablet Take 1 tablet (20 mg total) by mouth daily. 90 tablet 3   buPROPion (WELLBUTRIN XL) 150 MG 24 hr tablet TAKE 1 TABLET BY MOUTH EVERY DAY 90 tablet 1   Cholecalciferol (VITAMIN D3 PO) Take by mouth every evening.     ELIQUIS 5 MG TABS tablet TAKE 1 TABLET BY MOUTH TWICE A DAY 60 tablet 5   esomeprazole (NEXIUM) 20 MG capsule Take 20 mg by mouth daily as needed.     metoprolol succinate (TOPROL-XL) 50 MG 24 hr tablet TAKE 1 TABLET BY MOUTH EVERY DAY 90 tablet 1   Multiple Vitamin (MULTIVITAMIN) tablet Take 1 tablet by mouth every evening.     telmisartan (MICARDIS) 80 MG tablet Take 80 mg by mouth daily.     PREMARIN vaginal cream USE 1 TO 2 GRAMS INTRAVAGINALLY TWICE WEEKLY (Patient not taking: Reported on 10/03/2022) 30 g 2   No facility-administered medications prior to visit.    Review of Systems  Patient denies headache, fevers, malaise, unintentional weight loss, skin rash, eye pain, sinus congestion and sinus pain, sore throat, dysphagia,  hemoptysis , cough, dyspnea, wheezing, chest pain, palpitations, orthopnea, edema, abdominal pain, nausea, melena, diarrhea, constipation, flank pain, dysuria, hematuria, urinary  Frequency, nocturia, numbness, tingling, seizures,  Focal weakness, Loss of consciousness,  Tremor, insomnia, depression, anxiety, and suicidal ideation.     Objective:  BP 126/80   Pulse 88   Ht 5\' 3"  (1.6 m)   Wt 219 lb (99.3 kg)   SpO2 98%   BMI 38.79 kg/m   Physical Exam Vitals reviewed.  Constitutional:      General:  She is not in acute distress.    Appearance: Normal appearance. She is normal weight. She is not ill-appearing, toxic-appearing or diaphoretic.  HENT:     Head: Normocephalic.  Eyes:     General: No scleral icterus.       Right eye: No discharge.        Left eye: No discharge.     Conjunctiva/sclera: Conjunctivae normal.  Cardiovascular:     Rate and Rhythm: Normal rate and regular rhythm.     Heart sounds: Normal heart sounds.  Pulmonary:     Effort: Pulmonary effort is normal. No respiratory distress.     Breath sounds: Normal breath sounds.  Musculoskeletal:        General: Normal range of motion.  Skin:    General: Skin is warm and dry.  Neurological:     General: No focal deficit present.     Mental Status: She is alert and oriented to person, place, and time. Mental status is at baseline.  Psychiatric:        Mood and Affect: Mood normal.        Behavior: Behavior normal.        Thought Content: Thought content normal.        Judgment: Judgment normal.  Assessment & Plan:  Primary hypertension Assessment & Plan: Home readings have been reviewed and are 130/80 or less on telmisartan 80 mg and metoprolol 50 mg daily.  Encouarged to continue checking BP at home   Orders: -     Comprehensive metabolic panel  Morbid obesity with BMI of 40.0-44.9, adult [facility]) Assessment & Plan: She would like to resume GLP 1 agonist but had incresed GERD with Wegovy at 0.5 mg dose and insurance is no longer paying for medication.  Online sources and compounding pharmacies discussed.   Orders: -     Hemoglobin A1c -     CBC with Differential/Platelet  Pure hypercholesterolemia -     Lipid panel -     LDL cholesterol, direct -     TSH  Impaired fasting glucose -     Hemoglobin A1c  Need for pneumococcal 20-valent conjugate vaccination -     Pneumococcal conjugate vaccine 20-valent  Need for influenza vaccination -     Flu Vaccine Trivalent High Dose (Fluad)  Encounter for  preventive health examination Assessment & Plan: age appropriate education and counseling updated, referrals for preventative services and immunizations addressed, dietary and smoking counseling addressed, most recent labs reviewed.  I have personally reviewed and have noted:   1) the patient's medical and social history 2) The pt's use of alcohol, tobacco, and illicit drugs 3) The patient's current medications and supplements 4) Functional ability including ADL's, fall risk, home safety risk, hearing and visual impairment 5) Diet and physical activities 6) Evidence for depression or mood disorder 7) The patient's height, weight, and BMI have been recorded in the chart   I have made referrals, and provided counseling and education based on review of the above    History of herpes zoster Assessment & Plan: Acyclovir refilled for future prn use    White coat syndrome with diagnosis of hypertension Assessment & Plan: Home readings have been reviewed and are 130/80 or less on telmisartan 80 mg and metoprolol 50 mg daily.  Encouarged to continue checking BP at home    Chronic pain of right knee Assessment & Plan: She is managing with periodic  I/A kcorticosteroid injections,  with diminishing returns.  She has reqquested referral to [facility] orthopeidcs to consider knee replacement    Aortic atherosclerosis [facility]) Assessment & Plan: Marland KitchenMarland KitchenReviewed reason for statin .   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 Benign path, . Rechecking TSH and calcium today   Lab Results  Component Value Date   TSH 2.12 11/18/2022   Lab Results  Component Value Date   NA 139 11/18/2022   K 3.6 11/18/2022   CL 101 11/18/2022   CO2 28 11/18/2022      Other orders -     Acyclovir; Take 1 tablet (400 mg total) by mouth 5 (five) times daily. As needed for herpes flare  Dispense: 35  tablet; Refill: 2    Follow-up: No follow-ups on file.   Sherlene Shams, MD

## 2022-11-19 NOTE — Assessment & Plan Note (Signed)
S/p left hemithyroidectomy Jan 2024. Benign path, . Rechecking TSH and calcium today   Lab Results  Component Value Date   TSH 2.12 11/18/2022   Lab Results  Component Value Date   NA 139 11/18/2022   K 3.6 11/18/2022   CL 101 11/18/2022   CO2 28 11/18/2022

## 2022-11-19 NOTE — Assessment & Plan Note (Signed)

## 2022-11-19 NOTE — Assessment & Plan Note (Addendum)
Home readings have been reviewed and are 130/80 or less on telmisartan 80 mg and metoprolol 50 mg daily.  Encouarged to continue checking BP at home

## 2022-12-05 ENCOUNTER — Other Ambulatory Visit: Payer: Self-pay | Admitting: Internal Medicine

## 2022-12-09 DIAGNOSIS — M170 Bilateral primary osteoarthritis of knee: Secondary | ICD-10-CM | POA: Diagnosis not present

## 2023-01-01 ENCOUNTER — Encounter: Payer: Self-pay | Admitting: Internal Medicine

## 2023-02-07 DIAGNOSIS — H2513 Age-related nuclear cataract, bilateral: Secondary | ICD-10-CM | POA: Diagnosis not present

## 2023-02-07 DIAGNOSIS — H35372 Puckering of macula, left eye: Secondary | ICD-10-CM | POA: Diagnosis not present

## 2023-03-31 ENCOUNTER — Other Ambulatory Visit: Payer: Self-pay | Admitting: Internal Medicine

## 2023-04-04 ENCOUNTER — Encounter: Payer: Self-pay | Admitting: Internal Medicine

## 2023-04-13 DIAGNOSIS — M170 Bilateral primary osteoarthritis of knee: Secondary | ICD-10-CM | POA: Diagnosis not present

## 2023-05-18 ENCOUNTER — Encounter: Payer: Self-pay | Admitting: Internal Medicine

## 2023-05-18 ENCOUNTER — Telehealth: Payer: Self-pay

## 2023-05-18 ENCOUNTER — Other Ambulatory Visit (HOSPITAL_COMMUNITY): Payer: Self-pay

## 2023-05-18 ENCOUNTER — Ambulatory Visit: Payer: Medicare PPO | Admitting: Internal Medicine

## 2023-05-18 VITALS — BP 130/80 | HR 93 | Ht 63.0 in | Wt 229.4 lb

## 2023-05-18 DIAGNOSIS — E049 Nontoxic goiter, unspecified: Secondary | ICD-10-CM | POA: Diagnosis not present

## 2023-05-18 DIAGNOSIS — I10 Essential (primary) hypertension: Secondary | ICD-10-CM

## 2023-05-18 DIAGNOSIS — Z6841 Body Mass Index (BMI) 40.0 and over, adult: Secondary | ICD-10-CM | POA: Diagnosis not present

## 2023-05-18 DIAGNOSIS — E7800 Pure hypercholesterolemia, unspecified: Secondary | ICD-10-CM | POA: Diagnosis not present

## 2023-05-18 DIAGNOSIS — G8929 Other chronic pain: Secondary | ICD-10-CM | POA: Diagnosis not present

## 2023-05-18 DIAGNOSIS — M25561 Pain in right knee: Secondary | ICD-10-CM | POA: Diagnosis not present

## 2023-05-18 DIAGNOSIS — M25562 Pain in left knee: Secondary | ICD-10-CM | POA: Diagnosis not present

## 2023-05-18 LAB — LDL CHOLESTEROL, DIRECT: Direct LDL: 85 mg/dL

## 2023-05-18 LAB — LIPID PANEL
Cholesterol: 176 mg/dL (ref 0–200)
HDL: 69.6 mg/dL (ref >39.00)
LDL Cholesterol: 94 mg/dL (ref 0–99)
NonHDL: 106.58
Total CHOL/HDL Ratio: 3
Triglycerides: 62 mg/dL (ref 0.0–149.0)
VLDL: 12.4 mg/dL (ref 0.0–40.0)

## 2023-05-18 LAB — TSH: TSH: 2.74 u[IU]/mL (ref 0.35–5.50)

## 2023-05-18 LAB — COMPREHENSIVE METABOLIC PANEL WITH GFR
ALT: 19 U/L (ref 0–35)
AST: 17 U/L (ref 0–37)
Albumin: 4.3 g/dL (ref 3.5–5.2)
Alkaline Phosphatase: 57 U/L (ref 39–117)
BUN: 27 mg/dL — ABNORMAL HIGH (ref 6–23)
CO2: 27 meq/L (ref 19–32)
Calcium: 9.8 mg/dL (ref 8.4–10.5)
Chloride: 100 meq/L (ref 96–112)
Creatinine, Ser: 1.07 mg/dL (ref 0.40–1.20)
GFR: 52.2 mL/min — ABNORMAL LOW (ref >60.00)
Glucose, Bld: 101 mg/dL — ABNORMAL HIGH (ref 70–99)
Potassium: 4 meq/L (ref 3.5–5.1)
Sodium: 136 meq/L (ref 135–145)
Total Bilirubin: 0.8 mg/dL (ref 0.2–1.2)
Total Protein: 6.7 g/dL (ref 6.0–8.3)

## 2023-05-18 MED ORDER — TELMISARTAN 80 MG PO TABS: 80.0000 mg | ORAL_TABLET | Freq: Every day | ORAL | 1 refills | Status: DC

## 2023-05-18 MED ORDER — SEMAGLUTIDE-WEIGHT MANAGEMENT 0.25 MG/0.5ML ~~LOC~~ SOAJ
0.2500 mg | SUBCUTANEOUS | 2 refills | Status: DC
Start: 2023-05-18 — End: 2023-07-12

## 2023-05-18 NOTE — Telephone Encounter (Signed)
 Pharmacy Patient Advocate Encounter  Received notification from [employer] that Prior Authorization for Wegovy  0.25MG /0.5ML auto-injectors has been APPROVED from 05/18/23 to 01/17/24. Ran test claim, Copay is $100. This test claim was processed through [facility]- copay amounts may vary at other pharmacies due to pharmacy/plan contracts, or as the patient moves through the different stages of their insurance plan.   PA #/Case ID/Reference #: [id number]

## 2023-05-18 NOTE — Telephone Encounter (Signed)
 Pharmacy Patient Advocate Encounter   Received notification from CoverMyMeds that prior authorization for Wegovy  0.25MG /0.5ML auto-injectors is required/requested.   Insurance verification completed.   The patient is insured through [employer] .   Per test claim: PA required; PA submitted to above mentioned insurance via CoverMyMeds Key/confirmation #/EOC [id number] Status is pending

## 2023-05-18 NOTE — Assessment & Plan Note (Signed)
 She has gained 10 lb since her last visit in November  would like to resume GLP 1 agonist but had incresed GERD with Wegovy  at 0.5 mg dose and insurance is no longer paying for medication.  Online sources and compounding pharmacies  were discussed but have become restricted.

## 2023-05-18 NOTE — Progress Notes (Unsigned)
 Subjective:  Patient ID: Michele Garza, female    DOB: [birth year]  Age: 73 y.o. MRN: [id number]  CC: The primary encounter diagnosis was White coat syndrome with diagnosis of hypertension. Diagnoses of Pure hypercholesterolemia, Morbid obesity with BMI of 40.0-44.9, adult (HCC), Retrosternal goiter, and Bilateral chronic knee pain were also pertinent to this visit.   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   2)  bilateral knee pain:  secondary to OA/DJD >  She is considering returning to Dr Aubry Blase because his prior surgery on her meniscus.   3) goiter: removed Jan 2024  by [facility] , biopsy negative    Outpatient Medications Prior to Visit  Medication Sig Dispense Refill   Acetaminophen  (TYLENOL  ARTHRITIS PAIN PO) Take by mouth.     acyclovir  (ZOVIRAX ) 400 MG tablet Take 1 tablet (400 mg total) by mouth 5 (five) times daily. As needed for herpes flare 35 tablet 2   atorvastatin  (LIPITOR) 20 MG tablet Take 1 tablet (20 mg total) by mouth daily. 90 tablet 3   buPROPion  (WELLBUTRIN  XL) 150 MG 24 hr tablet TAKE 1 TABLET BY MOUTH EVERY DAY 90 tablet 1   ELIQUIS  5 MG TABS tablet TAKE 1 TABLET BY MOUTH TWICE A DAY 60 tablet 5   esomeprazole (NEXIUM) 20 MG capsule Take 20 mg by mouth daily as needed.     metoprolol  succinate (TOPROL -XL) 50 MG 24 hr tablet TAKE 1 TABLET BY MOUTH EVERY DAY 90 tablet 1   Multiple Vitamin (MULTIVITAMIN) tablet Take 1 tablet by mouth every evening.     PREMARIN  vaginal cream USE 1 TO 2 GRAMS INTRAVAGINALLY TWICE WEEKLY 30 g 2   telmisartan  (MICARDIS ) 80 MG tablet Take 80 mg by mouth daily.     Cholecalciferol  (VITAMIN D3 PO) Take by mouth every evening.     No facility-administered medications prior to visit.     Review of Systems;  Patient denies headache, fevers, malaise, unintentional weight loss, skin rash, eye pain, sinus congestion and sinus pain, sore throat, dysphagia,  hemoptysis , cough, dyspnea, wheezing, chest pain, palpitations, orthopnea, edema, abdominal pain, nausea, melena, diarrhea, constipation, flank pain, dysuria, hematuria, urinary  Frequency, nocturia, numbness, tingling, seizures,  Focal weakness, Loss of consciousness,  Tremor, insomnia, depression, anxiety, and suicidal ideation.      Objective:  BP 130/80   Pulse 93   Ht 5\' 3"  (1.6 m)   Wt 229 lb 6.4 oz (104.1 kg)   SpO2 95%   BMI 40.64 kg/m   BP Readings from Last 3 Encounters:  05/18/23 130/80  11/19/22 126/80  05/04/22 130/80    Wt Readings from Last 3 Encounters:  05/18/23 229 lb 6.4 oz (104.1 kg)  11/18/22 219 lb (99.3 kg)  10/03/22 217 lb (98.4 kg)    Physical Exam Vitals reviewed.  Constitutional:      General: She is not in acute distress.    Appearance: Normal appearance. She is normal weight. She is not ill-appearing, toxic-appearing or diaphoretic.  HENT:     Head: Normocephalic.  Eyes:     General: No scleral icterus.       Right eye: No discharge.        Left eye: No  discharge.     Conjunctiva/sclera: Conjunctivae normal.  Cardiovascular:     Rate and Rhythm: Normal rate and regular rhythm.     Heart sounds: Normal heart sounds.  Pulmonary:     Effort: Pulmonary effort is normal. No respiratory distress.     Breath sounds: Normal breath sounds.  Musculoskeletal:        General: Normal range of motion.  Skin:    General: Skin is warm and dry.  Neurological:     General: No focal deficit present.     Mental Status: She is alert and oriented to person, place, and time. Mental status is at baseline.  Psychiatric:        Mood and Affect: Mood normal.        Behavior: Behavior normal.        Thought Content: Thought content normal.        Judgment: Judgment normal.    Lab  Results  Component Value Date   HGBA1C 5.8 11/18/2022   HGBA1C 5.8 05/04/2022   HGBA1C 5.7 06/18/2021    Lab Results  Component Value Date   CREATININE 1.07 05/18/2023   CREATININE 1.07 11/18/2022   CREATININE 1.18 05/12/2022    Lab Results  Component Value Date   WBC 7.3 11/18/2022   HGB 13.0 11/18/2022   HCT 39.8 11/18/2022   PLT 220.0 11/18/2022   GLUCOSE 101 (H) 05/18/2023   CHOL 176 05/18/2023   TRIG 62.0 05/18/2023   HDL 69.60 05/18/2023   LDLDIRECT 85.0 05/18/2023   LDLCALC 94 05/18/2023   ALT 19 05/18/2023   AST 17 05/18/2023   NA 136 05/18/2023   K 4.0 05/18/2023   CL 100 05/18/2023   CREATININE 1.07 05/18/2023   BUN 27 (H) 05/18/2023   CO2 27 05/18/2023   TSH 2.74 05/18/2023   INR 1.1 03/16/2020   HGBA1C 5.8 11/18/2022   MICROALBUR 0.9 05/04/2022    CT Angio Chest W/Cm &/Or Wo Cm Result Date: 01/20/2021 CLINICAL DATA:  History of pulmonary embolism treated with thrombectomy. Currently on anticoagulation. Evaluate for acute or chronic pulmonary embolism. EXAM: CT ANGIOGRAPHY CHEST WITH CONTRAST TECHNIQUE: Multidetector CT imaging of the chest was performed using the standard protocol during bolus administration of intravenous contrast. Multiplanar CT image reconstructions and MIPs were obtained to evaluate the vascular anatomy. CONTRAST:  75mL OMNIPAQUE  IOHEXOL  350 MG/ML SOLN COMPARISON:  Chest CTA-03/16/2020 Thyroid  ultrasound-03/16/2020 FINDINGS: Vascular Findings: There is adequate opacification of the pulmonary arterial system with the main pulmonary artery measuring 300 Hounsfield units. There are no discrete filling defects within the pulmonary arterial tree to suggest acute or chronic pulmonary embolism. Normal caliber of the main pulmonary artery. Cardiomegaly. No pericardial effusion. Coronary artery calcifications. Scattered atherosclerotic plaque within a normal caliber thoracic aorta. Conventional configuration of the aortic arch. The descending thoracic  aorta is mildly tortuous but of normal caliber. Review of the MIP images confirms the above findings. ---------------------------------------------------------------------------------- Nonvascular Findings: Mediastinum/Lymph Nodes: No bulky mediastinal, hilar or axillary lymphadenopathy. Lungs/Pleura: Minimal dependent subpleural ground-glass atelectasis. No discrete focal airspace opacities. No pleural effusion or pneumothorax. The central pulmonary airways appear widely patent. No discrete pulmonary nodules. Upper abdomen: Limited early arterial phase evaluation of the upper abdomen suggests a small hiatal hernia. Scattered colonic diverticulosis without evidence of superimposed acute diverticulitis. Previously noted trace perihepatic ascites and stranding at the level of the porta hepatis has apparently resolved in the interval. Musculoskeletal: No acute or aggressive osseous abnormalities. 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 Asymmetric enlargement of the left lobe of the thyroid  with scattered dystrophic calcifications and mass effect upon the tracheal air column at the level of the thoracic inlet (representative image 18, series 4), similar to chest CT performed 03/16/2020 as well as further evaluated on dedicated thyroid  ultrasound also performed on 03/16/2020. IMPRESSION: 1. No evidence of acute or chronic pulmonary embolism. 2. Cardiomegaly. 3. Coronary artery calcifications. Aortic Atherosclerosis (ICD10-I70.0). 4. Redemonstrated asymmetric enlargement of the left lobe of the thyroid  as demonstrated on both previous chest CT and dedicated thyroid  ultrasound performed 03/16/2020. If recommended ultrasound-guided fine-needle aspiration is not been performed, repeat diagnostic thyroid  ultrasound is recommended. Electronically Signed   By: Robbi Childs M.D.   On: 01/20/2021 09:37    Assessment & Plan:  .Camilo Cella coat syndrome with diagnosis of hypertension Assessment &  Plan: Home readings have been reviewed and are 130/80 or less on telmisartan  80 mg and metoprolol  50 mg daily.  Encouarged to continue checking BP at home   Orders: -     Comprehensive metabolic panel with GFR  Pure hypercholesterolemia -     Lipid panel -     LDL cholesterol, direct  Morbid obesity with BMI of 40.0-44.9, adult [facility]) Assessment & Plan: She has gained 10 lb since her last visit in November  would like to resume GLP 1 agonist but had incresed GERD with Wegovy  at 0.5 mg dose .  Resume  medication at 0.25 mg weekly    Retrosternal goiter -     TSH  Bilateral chronic knee pain Assessment & Plan: Managed with  periodic  I/A corticosteroid injections,  with diminishing returns.  She is consdiering knee surgery but will need to lose weight    Other orders -     Semaglutide -Weight Management; Inject 0.25 mg into the skin once a week for 28 days.  Dispense: 2 mL; Refill: 2 -     Telmisartan ; Take 1 tablet (80 mg total) by mouth daily.  Dispense: 90 tablet; Refill: 1     I spent 34 minutes on the day of this face to face encounter reviewing patient's  most recent visit with orthopedics,  ENT,   prior relevant surgical and non surgical procedures, recent  labs and imaging studies, counseling on weight management,  reviewing the assessment and plan with patient, and post visit ordering and reviewing of  diagnostics and therapeutics with patient  .   Follow-up: Return in about 6 months (around 11/18/2023).   Thersia Flax, MD

## 2023-05-18 NOTE — Patient Instructions (Addendum)
 Restarting Wegovy  at initial dose of 0.25 mg weekly  Refills for that dose will be put  on file, but to increase your dose,  you need to send me a message via mychart   Your blood pressure  goal is  130/80 or less (preferably 120/50).  Please monitor your blood pressure  ONCE A WEEK at home and send me the readings  in one month so I can determine if you need to start an additional  medication to lower your blood pressure .

## 2023-05-19 ENCOUNTER — Encounter: Payer: Self-pay | Admitting: Internal Medicine

## 2023-05-21 ENCOUNTER — Encounter: Payer: Self-pay | Admitting: Internal Medicine

## 2023-05-21 NOTE — Assessment & Plan Note (Addendum)
 Managed with  periodic  I/A corticosteroid injections,  with diminishing returns.  She is consdiering knee surgery but will need to lose weight

## 2023-05-21 NOTE — Assessment & Plan Note (Signed)
 Home readings have been reviewed and are 130/80 or less on telmisartan 80 mg and metoprolol 50 mg daily.  Encouarged to continue checking BP at home

## 2023-05-25 ENCOUNTER — Other Ambulatory Visit: Payer: Self-pay | Admitting: Internal Medicine

## 2023-06-12 ENCOUNTER — Encounter: Payer: Self-pay | Admitting: Internal Medicine

## 2023-06-18 ENCOUNTER — Encounter: Payer: Self-pay | Admitting: Internal Medicine

## 2023-06-20 ENCOUNTER — Other Ambulatory Visit: Payer: Self-pay

## 2023-06-26 ENCOUNTER — Other Ambulatory Visit: Payer: Self-pay | Admitting: Internal Medicine

## 2023-07-12 ENCOUNTER — Encounter: Payer: Self-pay | Admitting: Internal Medicine

## 2023-07-12 MED ORDER — SEMAGLUTIDE-WEIGHT MANAGEMENT 0.5 MG/0.5ML ~~LOC~~ SOAJ: 0.5000 mg | SUBCUTANEOUS | 2 refills | Status: DC

## 2023-07-13 DIAGNOSIS — Z1231 Encounter for screening mammogram for malignant neoplasm of breast: Secondary | ICD-10-CM | POA: Diagnosis not present

## 2023-07-13 LAB — HM MAMMOGRAPHY

## 2023-07-14 DIAGNOSIS — M170 Bilateral primary osteoarthritis of knee: Secondary | ICD-10-CM | POA: Diagnosis not present

## 2023-07-18 ENCOUNTER — Telehealth: Payer: Self-pay

## 2023-07-18 NOTE — Telephone Encounter (Signed)
 Copied from CRM [id number]. Topic: Clinical - Request for Lab/Test Order >> Jul 18, 2023  9:34 AM Michele Garza wrote: Reason for CRM: Inquiry about order request being received on 07/13/23 for patient to have DX mammogram. Confirmed receipt of request. Patient is very worried and would like order placed as soon as possible in order for her to be scheduled for her mammogram.

## 2023-07-18 NOTE — Telephone Encounter (Signed)
 Called pt to let her know that we faxed the order today. Pt gave a verbal understanding.

## 2023-08-07 ENCOUNTER — Encounter: Payer: Self-pay | Admitting: Internal Medicine

## 2023-08-07 DIAGNOSIS — N6320 Unspecified lump in the left breast, unspecified quadrant: Secondary | ICD-10-CM

## 2023-08-07 DIAGNOSIS — R92322 Mammographic fibroglandular density, left breast: Secondary | ICD-10-CM | POA: Diagnosis not present

## 2023-08-07 DIAGNOSIS — R928 Other abnormal and inconclusive findings on diagnostic imaging of breast: Secondary | ICD-10-CM | POA: Diagnosis not present

## 2023-08-08 ENCOUNTER — Encounter: Payer: Self-pay | Admitting: Internal Medicine

## 2023-08-08 DIAGNOSIS — H2513 Age-related nuclear cataract, bilateral: Secondary | ICD-10-CM | POA: Diagnosis not present

## 2023-08-08 DIAGNOSIS — H35033 Hypertensive retinopathy, bilateral: Secondary | ICD-10-CM | POA: Diagnosis not present

## 2023-08-08 DIAGNOSIS — I10 Essential (primary) hypertension: Secondary | ICD-10-CM | POA: Diagnosis not present

## 2023-08-08 DIAGNOSIS — H524 Presbyopia: Secondary | ICD-10-CM | POA: Diagnosis not present

## 2023-08-08 DIAGNOSIS — H35372 Puckering of macula, left eye: Secondary | ICD-10-CM | POA: Diagnosis not present

## 2023-08-09 DIAGNOSIS — C50919 Malignant neoplasm of unspecified site of unspecified female breast: Secondary | ICD-10-CM | POA: Insufficient documentation

## 2023-08-09 DIAGNOSIS — N6320 Unspecified lump in the left breast, unspecified quadrant: Secondary | ICD-10-CM | POA: Insufficient documentation

## 2023-08-09 NOTE — Assessment & Plan Note (Signed)
 Suspicious for malignanacy per [facility] diagnostic mammogram.  Biopsy planned by [facility] radiolog

## 2023-08-14 DIAGNOSIS — C50912 Malignant neoplasm of unspecified site of left female breast: Secondary | ICD-10-CM | POA: Diagnosis not present

## 2023-08-14 DIAGNOSIS — C50919 Malignant neoplasm of unspecified site of unspecified female breast: Secondary | ICD-10-CM | POA: Diagnosis not present

## 2023-08-14 DIAGNOSIS — N6321 Unspecified lump in the left breast, upper outer quadrant: Secondary | ICD-10-CM | POA: Diagnosis not present

## 2023-08-14 DIAGNOSIS — R928 Other abnormal and inconclusive findings on diagnostic imaging of breast: Secondary | ICD-10-CM | POA: Diagnosis not present

## 2023-08-18 ENCOUNTER — Telehealth: Payer: Self-pay

## 2023-08-18 NOTE — Telephone Encounter (Signed)
 Biopsy results have been received.

## 2023-08-18 NOTE — Telephone Encounter (Signed)
 Received phone call from [facility] with [facility].  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  Radiologist is recommending referral to surgeon.  The breast center has already given patient results and made her aware that a referral has been sent to surgeon.  No other needs at this time.  Ebony explained that we will receive a faxed report.  Checked efax folder and with front desk, have not received at the time of her phone call.  If we do not receive results by end of day Friday please call Monday to request the report be faxed.  Ebony's call back number: [phone].  Message sent to [facility] clinical for follow up on faxed report.

## 2023-08-18 NOTE — Telephone Encounter (Signed)
 Biopsy results received by fax and uploaded into patient's chart under Media.

## 2023-08-21 DIAGNOSIS — C50912 Malignant neoplasm of unspecified site of left female breast: Secondary | ICD-10-CM | POA: Diagnosis not present

## 2023-09-04 DIAGNOSIS — C50912 Malignant neoplasm of unspecified site of left female breast: Secondary | ICD-10-CM | POA: Diagnosis not present

## 2023-09-05 ENCOUNTER — Encounter: Payer: Self-pay | Admitting: Internal Medicine

## 2023-09-05 DIAGNOSIS — C50812 Malignant neoplasm of overlapping sites of left female breast: Secondary | ICD-10-CM | POA: Diagnosis not present

## 2023-09-05 DIAGNOSIS — Z170 Estrogen receptor positive status [ER+]: Secondary | ICD-10-CM | POA: Diagnosis not present

## 2023-09-06 DIAGNOSIS — C50912 Malignant neoplasm of unspecified site of left female breast: Secondary | ICD-10-CM | POA: Diagnosis not present

## 2023-09-06 DIAGNOSIS — C50412 Malignant neoplasm of upper-outer quadrant of left female breast: Secondary | ICD-10-CM | POA: Diagnosis not present

## 2023-09-07 DIAGNOSIS — Z86711 Personal history of pulmonary embolism: Secondary | ICD-10-CM | POA: Diagnosis not present

## 2023-09-07 DIAGNOSIS — Z1721 Progesterone receptor positive status: Secondary | ICD-10-CM | POA: Diagnosis not present

## 2023-09-07 DIAGNOSIS — Z7901 Long term (current) use of anticoagulants: Secondary | ICD-10-CM | POA: Diagnosis not present

## 2023-09-07 DIAGNOSIS — Z170 Estrogen receptor positive status [ER+]: Secondary | ICD-10-CM | POA: Diagnosis not present

## 2023-09-07 DIAGNOSIS — Z1722 Progesterone receptor negative status: Secondary | ICD-10-CM | POA: Diagnosis not present

## 2023-09-07 DIAGNOSIS — K219 Gastro-esophageal reflux disease without esophagitis: Secondary | ICD-10-CM | POA: Diagnosis not present

## 2023-09-07 DIAGNOSIS — I10 Essential (primary) hypertension: Secondary | ICD-10-CM | POA: Diagnosis not present

## 2023-09-07 DIAGNOSIS — C50912 Malignant neoplasm of unspecified site of left female breast: Secondary | ICD-10-CM | POA: Diagnosis not present

## 2023-09-07 DIAGNOSIS — E7800 Pure hypercholesterolemia, unspecified: Secondary | ICD-10-CM | POA: Diagnosis not present

## 2023-09-07 DIAGNOSIS — C50412 Malignant neoplasm of upper-outer quadrant of left female breast: Secondary | ICD-10-CM | POA: Diagnosis not present

## 2023-09-07 DIAGNOSIS — E890 Postprocedural hypothyroidism: Secondary | ICD-10-CM | POA: Diagnosis not present

## 2023-09-07 MED ORDER — WEGOVY 1 MG/0.5ML ~~LOC~~ SOAJ: 1.0000 mg | SUBCUTANEOUS | 2 refills | Status: DC

## 2023-09-12 ENCOUNTER — Encounter: Payer: Self-pay | Admitting: Surgical Oncology

## 2023-09-14 ENCOUNTER — Encounter: Payer: Self-pay | Admitting: Internal Medicine

## 2023-09-14 ENCOUNTER — Ambulatory Visit: Payer: Self-pay | Admitting: Internal Medicine

## 2023-09-19 DIAGNOSIS — C50912 Malignant neoplasm of unspecified site of left female breast: Secondary | ICD-10-CM | POA: Diagnosis not present

## 2023-09-19 DIAGNOSIS — Z170 Estrogen receptor positive status [ER+]: Secondary | ICD-10-CM | POA: Diagnosis not present

## 2023-09-19 DIAGNOSIS — C50412 Malignant neoplasm of upper-outer quadrant of left female breast: Secondary | ICD-10-CM | POA: Diagnosis not present

## 2023-09-22 DIAGNOSIS — C50912 Malignant neoplasm of unspecified site of left female breast: Secondary | ICD-10-CM | POA: Diagnosis not present

## 2023-09-22 DIAGNOSIS — Z79811 Long term (current) use of aromatase inhibitors: Secondary | ICD-10-CM | POA: Diagnosis not present

## 2023-09-23 ENCOUNTER — Other Ambulatory Visit: Payer: Self-pay | Admitting: Internal Medicine

## 2023-09-26 DIAGNOSIS — C50812 Malignant neoplasm of overlapping sites of left female breast: Secondary | ICD-10-CM | POA: Diagnosis not present

## 2023-09-26 DIAGNOSIS — Z170 Estrogen receptor positive status [ER+]: Secondary | ICD-10-CM | POA: Diagnosis not present

## 2023-10-03 ENCOUNTER — Encounter: Payer: Self-pay | Admitting: Internal Medicine

## 2023-10-04 MED ORDER — SEMAGLUTIDE-WEIGHT MANAGEMENT 0.5 MG/0.5ML ~~LOC~~ SOAJ: 0.5000 mg | SUBCUTANEOUS | 0 refills | Status: DC

## 2023-10-05 DIAGNOSIS — Z170 Estrogen receptor positive status [ER+]: Secondary | ICD-10-CM | POA: Diagnosis not present

## 2023-10-05 DIAGNOSIS — M170 Bilateral primary osteoarthritis of knee: Secondary | ICD-10-CM | POA: Diagnosis not present

## 2023-10-05 DIAGNOSIS — C50812 Malignant neoplasm of overlapping sites of left female breast: Secondary | ICD-10-CM | POA: Diagnosis not present

## 2023-10-10 ENCOUNTER — Ambulatory Visit (INDEPENDENT_AMBULATORY_CARE_PROVIDER_SITE_OTHER): Payer: Medicare PPO | Admitting: *Deleted

## 2023-10-10 VITALS — Ht 63.0 in | Wt 214.0 lb

## 2023-10-10 DIAGNOSIS — Z0000 Encounter for general adult medical examination without abnormal findings: Secondary | ICD-10-CM

## 2023-10-10 NOTE — Patient Instructions (Signed)
 [name],  Thank you for taking the time for your Medicare Wellness Visit. I appreciate your continued commitment to your health goals. Please review the care plan we discussed, and feel free to reach out if I can assist you further.  Medicare recommends these wellness visits once per year to help you and your care team stay ahead of potential health issues. These visits are designed to focus on prevention, allowing your provider to concentrate on managing your acute and chronic conditions during your regular appointments.  Please note that Annual Wellness Visits do not include a physical exam. Some assessments may be limited, especially if the visit was conducted virtually. If needed, we may recommend a separate in-person follow-up with your provider.  Ongoing Care Seeing your primary care provider every 3 to 6 months helps us  monitor your health and provide consistent, personalized care.  Remember to update your flu and tetanus vaccines.   Referrals If a referral was made during today's visit and you haven't received any updates within two weeks, please contact the referred provider directly to check on the status.  Recommended Screenings:  Health Maintenance  Topic Date Due   DTaP/Tdap/Td vaccine (2 - Td or Tdap) 03/01/2023   Flu Shot  08/18/2023   COVID-19 Vaccine (4 - 2025-26 season) 09/18/2023   Breast Cancer Screening  07/12/2024   Medicare Annual Wellness Visit  10/09/2024   Colon Cancer Screening  02/20/2025   Pneumococcal Vaccine for age over 46  Completed   DEXA scan (bone density measurement)  Completed   Hepatitis C Screening  Completed   Zoster (Shingles) Vaccine  Completed   HPV Vaccine  Aged Out   Meningitis B Vaccine  Aged Out       10/10/2023    9:56 AM  Advanced Directives  Does Patient Have a Medical Advance Directive? Yes  Type of Estate agent of [place];Living will  Copy of Healthcare Power of Attorney in Chart? No - copy requested    Advance Care Planning is important because it: Ensures you receive medical care that aligns with your values, goals, and preferences. Provides guidance to your family and loved ones, reducing the emotional burden of decision-making during critical moments.  Vision: Annual vision screenings are recommended for early detection of glaucoma, cataracts, and diabetic retinopathy. These exams can also reveal signs of chronic conditions such as diabetes and high blood pressure.  Dental: Annual dental screenings help detect early signs of oral cancer, gum disease, and other conditions linked to overall health, including heart disease and diabetes.  Please see the attached documents for additional preventive care recommendations.

## 2023-10-10 NOTE — Progress Notes (Signed)
 Subjective:   Michele Garza is a 73 y.o. who presents for a Medicare Wellness preventive visit.  As a reminder, Annual Wellness Visits don't include a physical exam, and some assessments may be limited, especially if this visit is performed virtually. We may recommend an in-person follow-up visit with your provider if needed.  Visit Complete: Virtual I connected with  Carisma Troupe on 10/10/23 by a audio enabled telemedicine application and verified that I am speaking with the correct person using two identifiers.  Patient Location: Home  Provider Location: Home Office  I discussed the limitations of evaluation and management by telemedicine. The patient expressed understanding and agreed to proceed.  Vital Signs: Because this visit was a virtual/telehealth visit, some criteria may be missing or patient reported. Any vitals not documented were not able to be obtained and vitals that have been documented are patient reported.  VideoDeclined- This patient declined Librarian, academic. Therefore the visit was completed with audio only.  Persons Participating in Visit: Patient.  AWV Questionnaire: Yes: Patient Medicare AWV questionnaire was completed by the patient on 10/04/23 and 10/06/23; I have confirmed that all information answered by patient is correct and no changes since this date.  Cardiac Risk Factors include: advanced age (>48men, >13 women);dyslipidemia;hypertension;obesity (BMI >30kg/m2)     Objective:    Today's Vitals   10/10/23 0930  Weight: 214 lb (97.1 kg)  Height: 5' 3 (1.6 m)   Body mass index is 37.91 kg/m.     10/10/2023    9:56 AM 10/03/2022    9:16 AM 10/01/2021    9:29 AM 06/04/2021    9:38 AM 09/30/2020   11:38 AM 07/07/2020    3:01 PM 03/23/2020    5:48 PM  Advanced Directives  Does Patient Have a Medical Advance Directive? Yes Yes Yes No Yes No No  Type of Estate agent of [place];Living will Healthcare  Power of [place];Living will Healthcare Power of [place];Living will  Healthcare Power of [place];Living will    Does patient want to make changes to medical advance directive?   No - Patient declined  No - Patient declined    Copy of Healthcare Power of Attorney in Chart? No - copy requested No - copy requested No - copy requested  No - copy requested    Would patient like information on creating a medical advance directive?    No - Patient declined  No - Patient declined     Current Medications (verified) Outpatient Encounter Medications as of 10/10/2023  Medication Sig   Acetaminophen  (TYLENOL  ARTHRITIS PAIN PO) Take by mouth.   acyclovir  (ZOVIRAX ) 400 MG tablet Take 1 tablet (400 mg total) by mouth 5 (five) times daily. As needed for herpes flare   atorvastatin  (LIPITOR) 20 MG tablet TAKE 1 TABLET BY MOUTH EVERY DAY   buPROPion  (WELLBUTRIN  XL) 150 MG 24 hr tablet TAKE 1 TABLET BY MOUTH EVERY DAY   Docusate Calcium  (STOOL SOFTENER PO) Take by mouth at bedtime.   ELIQUIS  5 MG TABS tablet TAKE 1 TABLET BY MOUTH TWICE A DAY   esomeprazole (NEXIUM) 20 MG capsule Take 20 mg by mouth daily as needed.   metoprolol  succinate (TOPROL -XL) 50 MG 24 hr tablet TAKE 1 TABLET BY MOUTH EVERY DAY   Multiple Vitamin (MULTIVITAMIN) tablet Take 1 tablet by mouth every evening.   [START ON 11/02/2023] semaglutide -weight management (WEGOVY ) 0.5 MG/0.5ML SOAJ SQ injection Inject 0.5 mg into the skin once a week for 28 days.  telmisartan  (MICARDIS ) 80 MG tablet Take 1 tablet (80 mg total) by mouth daily.   PREMARIN  vaginal cream USE 1 TO 2 GRAMS INTRAVAGINALLY TWICE WEEKLY (Patient not taking: Reported on 10/10/2023)   No facility-administered encounter medications on file as of 10/10/2023.    Allergies (verified) Clindamycin /lincomycin   History: Past Medical History:  Diagnosis Date   Anxiety    Depression    Diverticulosis of colon    one prior episode July 2011 caused by tomato overingestion    GERD (gastroesophageal reflux disease)    History of colonoscopy    done in  dec 2011, repeat due 5 yrs due to [facility]   Hypertension    Pulmonary emboli (HCC) 03/16/2020   Past Surgical History:  Procedure Laterality Date   BREAST LUMPECTOMY Left 09/07/2023   BREAST SURGERY     sterotactic breaswt biopsy: atypical cells,  continued diagnostic mammograms   COLONOSCOPY     COLONOSCOPY WITH PROPOFOL  N/A 02/21/2020   Procedure: COLONOSCOPY WITH PROPOFOL ;  Surgeon: Janalyn Keene NOVAK, MD;  Location: [facility] ENDOSCOPY;  Service: Endoscopy;  Laterality: N/A;   PULMONARY THROMBECTOMY Bilateral 03/17/2020   Procedure: PULMONARY THROMBECTOMY / THROMBOLYSIS;  Surgeon: Jama Cordella MATSU, MD;  Location: [facility] INVASIVE CV LAB;  Service: Cardiovascular;  Laterality: Bilateral;   THYROIDECTOMY, PARTIAL  01/27/2022   Family History  Problem Relation Age of Onset   Cancer Mother        bilatera, contralateral recurred after 30 yrs   Stroke Father    Heart disease Father 46       died of stroke  at 55, tobacco user   Cancer Father        colon Ca   Cancer Maternal Uncle        breast x 2 aunts   Cancer Paternal Grandmother 68       metastatic colon Ca   Social History   Socioeconomic History   Marital status: Divorced    Spouse name: Not on file   Number of children: Not on file   Years of education: Not on file   Highest education level: 12th grade  Occupational History   Occupation: Surveyor, minerals: [employer]    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[place])  Tobacco Use   Smoking status: Never   Smokeless tobacco: Never  Vaping Use   Vaping status: Never Used  Substance and Sexual Activity   Alcohol use: Yes    Alcohol/week: 1.0 standard drink of alcohol    Types: 1 Shots of liquor per week    Comment: occasional   Drug use: No   Sexual activity: Not on file  Other Topics Concern   Not on file  Social History Narrative   Divorced   Social Drivers of Health    Financial Resource Strain: Low Risk  (10/06/2023)   Overall Financial Resource Strain (CARDIA)    Difficulty of Paying Living Expenses: Not hard at all  Food Insecurity: No Food Insecurity (10/06/2023)   Hunger Vital Sign    Worried About Running Out of Food in the Last Year: Never true    Ran Out of Food in the Last Year: Never true  Transportation Needs: No Transportation Needs (10/06/2023)   PRAPARE - Administrator, Civil Service (Medical): No    Lack of Transportation (Non-Medical): No  Physical Activity: Inactive (10/06/2023)   Exercise Vital Sign    Days of Exercise per Week: 0 days  Minutes of Exercise per Session: 0 min  Stress: No Stress Concern Present (10/06/2023)   [employer] of Occupational Health - Occupational Stress Questionnaire    Feeling of Stress: Not at all  Social Connections: Socially Isolated (10/06/2023)   Social Connection and Isolation Panel    Frequency of Communication with Friends and Family: Once a week    Frequency of Social Gatherings with Friends and Family: Never    Attends Religious Services: More than 4 times per year    Active Member of [employer] or Organizations: No    Attends Engineer, structural: Never    Marital Status: Divorced    Tobacco Counseling Counseling given: Not Answered    Clinical Intake:  Pre-visit preparation completed: Yes  Pain : No/denies pain     BMI - recorded: 37.91 Nutritional Status: BMI > 30  Obese Nutritional Risks: None Diabetes: No  Lab Results  Component Value Date   HGBA1C 5.8 11/18/2022   HGBA1C 5.8 05/04/2022   HGBA1C 5.7 06/18/2021     How often do you need to have someone help you when you read instructions, pamphlets, or other written materials from your doctor or pharmacy?: 1 - Never  Interpreter Needed?: No  Information entered by :: R. Emsley Custer LPN   Activities of Daily Living     10/04/2023   10:38 PM  In your present state of health, do you have any  difficulty performing the following activities:  Hearing? 0  Vision? 0  Difficulty concentrating or making decisions? 0  Walking or climbing stairs? 1  Dressing or bathing? 0  Doing errands, shopping? 0  Preparing Food and eating ? N  Using the Toilet? N  In the past six months, have you accidently leaked urine? N  Do you have problems with loss of bowel control? N  Managing your Medications? N  Managing your Finances? N  Housekeeping or managing your Housekeeping? N    Patient Care Team: Marylynn Verneita CROME, MD as PCP - General (Internal Medicine) Denys Reyes Lunger, MD as Referring Physician (Otolaryngology) Henriette Clotilda CROME, MD as Consulting Physician (Nephrology)  I have updated your Care Teams any recent Medical Services you may have received from other providers in the past year.     Assessment:   This is a routine wellness examination for Michele Garza.  Hearing/Vision screen Hearing Screening - Comments:: No issues Vision Screening - Comments:: Glasses and contacts   Goals Addressed             This Visit's Progress    Patient Stated       Wants to get one of knee surgeries taken care of so she can be more mobile       Depression Screen     10/10/2023    9:45 AM 05/18/2023   10:03 AM 11/18/2022   10:32 AM 10/03/2022    9:08 AM 05/04/2022    9:45 AM 01/03/2022    4:50 PM 10/01/2021    9:29 AM  PHQ 2/9 Scores  PHQ - 2 Score 0 1 1 1 2  0 2  PHQ- 9 Score 0 3 2 1 3  0 3    Fall Risk     10/04/2023   10:38 PM 05/18/2023   10:03 AM 11/18/2022   10:32 AM 09/29/2022    1:56 PM 05/04/2022    9:45 AM  Fall Risk   Falls in the past year? 0 0 0 1 1  Number falls in past yr: 0 0  0 0 0  Injury with Fall? 0 0 0 0 0  Risk for fall due to : No Fall Risks No Fall Risks No Fall Risks History of fall(s);Other (Comment) History of fall(s)  Risk for fall due to: Comment    passed out   Follow up Falls evaluation completed;Falls prevention discussed Falls evaluation completed  Falls evaluation completed Falls evaluation completed;Falls prevention discussed Falls evaluation completed    MEDICARE RISK AT HOME:  Medicare Risk at Home Any stairs in or around the home?: Yes If so, are there any without handrails?: No Home free of loose throw rugs in walkways, pet beds, electrical cords, etc?: (Patient-Rptd) Yes Adequate lighting in your home to reduce risk of falls?: (Patient-Rptd) Yes Life alert?: (Patient-Rptd) No Use of a cane, walker or w/c?: (Patient-Rptd) No Grab bars in the bathroom?: (Patient-Rptd) No Shower chair or bench in shower?: (Patient-Rptd) Yes Elevated toilet seat or a handicapped toilet?: (Patient-Rptd) No  TIMED UP AND GO:  Was the test performed?  No  Cognitive Function: 6CIT completed    08/07/2019   12:19 PM  MMSE - Mini Mental State Exam  Not completed: Unable to complete        10/10/2023    9:56 AM 10/03/2022    9:17 AM 10/01/2021    9:40 AM 08/06/2018   12:15 PM  6CIT Screen  What Year? 0 points 0 points 0 points 0 points  What month? 0 points 0 points 0 points 0 points  What time? 0 points 0 points 0 points 0 points  Count back from 20 0 points 0 points 0 points 0 points  Months in reverse 0 points 0 points 0 points 0 points  Repeat phrase 0 points 0 points 0 points 0 points  Total Score 0 points 0 points 0 points 0 points    Immunizations Immunization History  Administered Date(s) Administered   Fluad Quad(high Dose 65+) 11/04/2019, 11/30/2020   Fluad Trivalent(High Dose 65+) 11/18/2022   Influenza,inj,Quad PF,6+ Mos 12/08/2017, 09/07/2018, 01/28/2022   PFIZER(Purple Top)SARS-COV-2 Vaccination 02/08/2019, 03/01/2019, 10/15/2019   PNEUMOCOCCAL CONJUGATE-20 11/18/2022   Pneumococcal Conjugate-13 04/28/2017   Tdap 02/28/2013   Zoster Recombinant(Shingrix) 07/26/2021, 10/04/2021    Screening Tests Health Maintenance  Topic Date Due   DTaP/Tdap/Td (2 - Td or Tdap) 03/01/2023   Influenza Vaccine  08/18/2023    COVID-19 Vaccine (4 - 2025-26 season) 09/18/2023   Medicare Annual Wellness (AWV)  10/03/2023   Mammogram  07/12/2024   Colonoscopy  02/20/2025   Pneumococcal Vaccine: 50+ Years  Completed   DEXA SCAN  Completed   Hepatitis C Screening  Completed   Zoster Vaccines- Shingrix  Completed   HPV VACCINES  Aged Out   Meningococcal B Vaccine  Aged Out    Health Maintenance Items Addressed: Discussed the need to update flu and tetanus vaccines. Patient declines covid vaccines. Patient has an appointment scheduled for Dexa at [facility] 10/17/23.  Additional Screening:  Vision Screening: Recommended annual ophthalmology exams for early detection of glaucoma and other disorders of the eye. Is the patient up to date with their annual eye exam?  Yes  Who is the provider or what is the name of the office in which the patient attends annual eye exams?  [facility]  Dental Screening: Recommended annual dental exams for proper oral hygiene  Community Resource Referral / Chronic Care Management: CRR required this visit?  No   CCM required this visit?  No   Plan:    I  have personally reviewed and noted the following in the patient's chart:   Medical and social history Use of alcohol, tobacco or illicit drugs  Current medications and supplements including opioid prescriptions. Patient is not currently taking opioid prescriptions. Functional ability and status Nutritional status Physical activity Advanced directives List of other physicians Hospitalizations, surgeries, and ER visits in previous 12 months Vitals Screenings to include cognitive, depression, and falls Referrals and appointments  In addition, I have reviewed and discussed with patient certain preventive protocols, quality metrics, and best practice recommendations. A written personalized care plan for preventive services as well as general preventive health recommendations were provided to patient.   Angeline Fredericks, LPN   [id number]    After Visit Summary: (MyChart) Due to this being a telephonic visit, the after visit summary with patients personalized plan was offered to patient via MyChart   Notes: Nothing significant to report at this time.

## 2023-10-13 DIAGNOSIS — M170 Bilateral primary osteoarthritis of knee: Secondary | ICD-10-CM | POA: Diagnosis not present

## 2023-10-16 DIAGNOSIS — C50812 Malignant neoplasm of overlapping sites of left female breast: Secondary | ICD-10-CM | POA: Diagnosis not present

## 2023-10-16 DIAGNOSIS — M170 Bilateral primary osteoarthritis of knee: Secondary | ICD-10-CM | POA: Diagnosis not present

## 2023-10-16 DIAGNOSIS — Z170 Estrogen receptor positive status [ER+]: Secondary | ICD-10-CM | POA: Diagnosis not present

## 2023-10-17 DIAGNOSIS — Z79811 Long term (current) use of aromatase inhibitors: Secondary | ICD-10-CM | POA: Diagnosis not present

## 2023-10-17 DIAGNOSIS — M85852 Other specified disorders of bone density and structure, left thigh: Secondary | ICD-10-CM | POA: Diagnosis not present

## 2023-10-18 DIAGNOSIS — Z170 Estrogen receptor positive status [ER+]: Secondary | ICD-10-CM | POA: Diagnosis not present

## 2023-10-18 DIAGNOSIS — M170 Bilateral primary osteoarthritis of knee: Secondary | ICD-10-CM | POA: Diagnosis not present

## 2023-10-18 DIAGNOSIS — C50812 Malignant neoplasm of overlapping sites of left female breast: Secondary | ICD-10-CM | POA: Diagnosis not present

## 2023-10-23 DIAGNOSIS — M170 Bilateral primary osteoarthritis of knee: Secondary | ICD-10-CM | POA: Diagnosis not present

## 2023-10-23 DIAGNOSIS — C50812 Malignant neoplasm of overlapping sites of left female breast: Secondary | ICD-10-CM | POA: Diagnosis not present

## 2023-10-23 DIAGNOSIS — Z170 Estrogen receptor positive status [ER+]: Secondary | ICD-10-CM | POA: Diagnosis not present

## 2023-10-25 DIAGNOSIS — Z170 Estrogen receptor positive status [ER+]: Secondary | ICD-10-CM | POA: Diagnosis not present

## 2023-10-25 DIAGNOSIS — C50812 Malignant neoplasm of overlapping sites of left female breast: Secondary | ICD-10-CM | POA: Diagnosis not present

## 2023-10-27 DIAGNOSIS — C50812 Malignant neoplasm of overlapping sites of left female breast: Secondary | ICD-10-CM | POA: Diagnosis not present

## 2023-10-27 DIAGNOSIS — Z170 Estrogen receptor positive status [ER+]: Secondary | ICD-10-CM | POA: Diagnosis not present

## 2023-10-30 DIAGNOSIS — Z170 Estrogen receptor positive status [ER+]: Secondary | ICD-10-CM | POA: Diagnosis not present

## 2023-10-30 DIAGNOSIS — C50812 Malignant neoplasm of overlapping sites of left female breast: Secondary | ICD-10-CM | POA: Diagnosis not present

## 2023-11-01 ENCOUNTER — Other Ambulatory Visit: Payer: Self-pay

## 2023-11-01 ENCOUNTER — Encounter: Payer: Self-pay | Admitting: Internal Medicine

## 2023-11-01 DIAGNOSIS — Z170 Estrogen receptor positive status [ER+]: Secondary | ICD-10-CM | POA: Diagnosis not present

## 2023-11-01 DIAGNOSIS — C50812 Malignant neoplasm of overlapping sites of left female breast: Secondary | ICD-10-CM | POA: Diagnosis not present

## 2023-11-01 MED ORDER — SEMAGLUTIDE-WEIGHT MANAGEMENT 0.5 MG/0.5ML ~~LOC~~ SOAJ: 0.5000 mg | SUBCUTANEOUS | 0 refills | Status: DC

## 2023-11-16 DIAGNOSIS — R3121 Asymptomatic microscopic hematuria: Secondary | ICD-10-CM | POA: Diagnosis not present

## 2023-11-16 DIAGNOSIS — I10 Essential (primary) hypertension: Secondary | ICD-10-CM | POA: Diagnosis not present

## 2023-11-16 DIAGNOSIS — N1831 Chronic kidney disease, stage 3a: Secondary | ICD-10-CM | POA: Diagnosis not present

## 2023-11-19 ENCOUNTER — Encounter: Payer: Self-pay | Admitting: Internal Medicine

## 2023-11-23 ENCOUNTER — Encounter: Payer: Self-pay | Admitting: Internal Medicine

## 2023-11-23 ENCOUNTER — Ambulatory Visit: Admitting: Internal Medicine

## 2023-11-23 VITALS — BP 124/78 | HR 88 | Ht 63.0 in | Wt 210.8 lb

## 2023-11-23 DIAGNOSIS — I700 Atherosclerosis of aorta: Secondary | ICD-10-CM

## 2023-11-23 DIAGNOSIS — Z0000 Encounter for general adult medical examination without abnormal findings: Secondary | ICD-10-CM

## 2023-11-23 DIAGNOSIS — I10 Essential (primary) hypertension: Secondary | ICD-10-CM | POA: Diagnosis not present

## 2023-11-23 DIAGNOSIS — R7301 Impaired fasting glucose: Secondary | ICD-10-CM | POA: Diagnosis not present

## 2023-11-23 DIAGNOSIS — R3121 Asymptomatic microscopic hematuria: Secondary | ICD-10-CM

## 2023-11-23 DIAGNOSIS — C50912 Malignant neoplasm of unspecified site of left female breast: Secondary | ICD-10-CM | POA: Diagnosis not present

## 2023-11-23 DIAGNOSIS — Z79899 Other long term (current) drug therapy: Secondary | ICD-10-CM | POA: Diagnosis not present

## 2023-11-23 DIAGNOSIS — Z23 Encounter for immunization: Secondary | ICD-10-CM

## 2023-11-23 DIAGNOSIS — E7800 Pure hypercholesterolemia, unspecified: Secondary | ICD-10-CM | POA: Diagnosis not present

## 2023-11-23 DIAGNOSIS — Z6841 Body Mass Index (BMI) 40.0 and over, adult: Secondary | ICD-10-CM

## 2023-11-23 DIAGNOSIS — Z800 Family history of malignant neoplasm of digestive organs: Secondary | ICD-10-CM

## 2023-11-23 LAB — LIPID PANEL
Cholesterol: 171 mg/dL (ref 0–200)
HDL: 58.6 mg/dL (ref >39.00)
LDL Cholesterol: 98 mg/dL (ref 0–99)
NonHDL: 112.48
Total CHOL/HDL Ratio: 3
Triglycerides: 73 mg/dL (ref 0.0–149.0)
VLDL: 14.6 mg/dL (ref 0.0–40.0)

## 2023-11-23 LAB — COMPREHENSIVE METABOLIC PANEL WITH GFR
ALT: 18 U/L (ref 0–35)
AST: 20 U/L (ref 0–37)
Albumin: 4.1 g/dL (ref 3.5–5.2)
Alkaline Phosphatase: 51 U/L (ref 39–117)
BUN: 27 mg/dL — ABNORMAL HIGH (ref 6–23)
CO2: 27 meq/L (ref 19–32)
Calcium: 9.8 mg/dL (ref 8.4–10.5)
Chloride: 101 meq/L (ref 96–112)
Creatinine, Ser: 1.21 mg/dL — ABNORMAL HIGH (ref 0.40–1.20)
GFR: 44.87 mL/min — ABNORMAL LOW (ref >60.00)
Glucose, Bld: 108 mg/dL — ABNORMAL HIGH (ref 70–99)
Potassium: 3.8 meq/L (ref 3.5–5.1)
Sodium: 138 meq/L (ref 135–145)
Total Bilirubin: 0.7 mg/dL (ref 0.2–1.2)
Total Protein: 6.8 g/dL (ref 6.0–8.3)

## 2023-11-23 LAB — URINALYSIS, ROUTINE W REFLEX MICROSCOPIC
Ketones, ur: NEGATIVE
Nitrite: NEGATIVE
Specific Gravity, Urine: 1.025 (ref 1.000–1.030)
Urine Glucose: NEGATIVE
Urobilinogen, UA: 0.2 (ref 0.0–1.0)
pH: 6 (ref 5.0–8.0)

## 2023-11-23 LAB — CBC WITH DIFFERENTIAL/PLATELET
Basophils Absolute: 0.1 K/uL (ref 0.0–0.1)
Basophils Relative: 1 % (ref 0.0–3.0)
Eosinophils Absolute: 0 K/uL (ref 0.0–0.7)
Eosinophils Relative: 0.7 % (ref 0.0–5.0)
HCT: 37.7 % (ref 36.0–46.0)
Hemoglobin: 12.6 g/dL (ref 12.0–15.0)
Lymphocytes Relative: 35 % (ref 12.0–46.0)
Lymphs Abs: 2.5 K/uL (ref 0.7–4.0)
MCHC: 33.5 g/dL (ref 30.0–36.0)
MCV: 95.9 fl (ref 78.0–100.0)
Monocytes Absolute: 0.7 K/uL (ref 0.1–1.0)
Monocytes Relative: 10.1 % (ref 3.0–12.0)
Neutro Abs: 3.8 K/uL (ref 1.4–7.7)
Neutrophils Relative %: 53.2 % (ref 43.0–77.0)
Platelets: 228 K/uL (ref 150.0–400.0)
RBC: 3.93 Mil/uL (ref 3.87–5.11)
RDW: 13.7 % (ref 11.5–15.5)
WBC: 7.2 K/uL (ref 4.0–10.5)

## 2023-11-23 LAB — TSH: TSH: 1.8 u[IU]/mL (ref 0.35–5.50)

## 2023-11-23 LAB — HEMOGLOBIN A1C: Hgb A1c MFr Bld: 5.7 % (ref 4.6–6.5)

## 2023-11-23 LAB — LDL CHOLESTEROL, DIRECT: Direct LDL: 100 mg/dL

## 2023-11-23 MED ORDER — ZEPBOUND 5 MG/0.5ML ~~LOC~~ SOAJ: 5.0000 mg | SUBCUTANEOUS | 1 refills | Status: DC

## 2023-11-23 MED ORDER — TETANUS-DIPHTH-ACELL PERTUSSIS 5-2-15.5 LF-MCG/0.5 IM SUSP: 0.5000 mL | Freq: Once | INTRAMUSCULAR | 0 refills | Status: AC

## 2023-11-23 NOTE — Progress Notes (Unsigned)
 Subjective:  Patient ID: Michele Garza, female    DOB: 1950/05/18  Age: 73 y.o. MRN: [id number]  CC: There were no encounter diagnoses.   HPI Michele Garza presents for No chief complaint on file.      CKD:  SEEN BY [facility] ON OCT 30.  TELMISARTAN  DOSE REDUCED TO 40 MG DAILY DUE TO HYPOTENSION  BP 97/61.  Microscopic hematuria (MARCH 2022  LAB) ordered by Michele Garza during March 8 office visit)  was  noted by nephrologist,  referred her to urology    BRCA:   s/p lumpectomy in August .  Given the choice of treatment, opted for XRT /clinical study rather than Femara  due to the side effects she anticipated .  Had some hair loss after surgery     Outpatient Medications Prior to Visit  Medication Sig Dispense Refill   Acetaminophen  (TYLENOL  ARTHRITIS PAIN PO) Take by mouth.     acyclovir  (ZOVIRAX ) 400 MG tablet Take 1 tablet (400 mg total) by mouth 5 (five) times daily. As needed for herpes flare 35 tablet 2   atorvastatin  (LIPITOR) 20 MG tablet TAKE 1 TABLET BY MOUTH EVERY DAY 90 tablet 3   buPROPion  (WELLBUTRIN  XL) 150 MG 24 hr tablet TAKE 1 TABLET BY MOUTH EVERY DAY 90 tablet 1   Docusate Calcium  (STOOL SOFTENER PO) Take by mouth at bedtime.     ELIQUIS  5 MG TABS tablet TAKE 1 TABLET BY MOUTH TWICE A DAY 60 tablet 5   esomeprazole (NEXIUM) 20 MG capsule Take 20 mg by mouth daily as needed.     metoprolol  succinate (TOPROL -XL) 50 MG 24 hr tablet TAKE 1 TABLET BY MOUTH EVERY DAY 90 tablet 1   Multiple Vitamin (MULTIVITAMIN) tablet Take 1 tablet by mouth every evening.     PREMARIN  vaginal cream USE 1 TO 2 GRAMS INTRAVAGINALLY TWICE WEEKLY (Patient not taking: Reported on 10/10/2023) 30 g 2   semaglutide -weight management (WEGOVY ) 0.5 MG/0.5ML SOAJ SQ injection Inject 0.5 mg into the skin once a week for 28 days. 2 mL 0   telmisartan  (MICARDIS ) 80 MG tablet Take 1 tablet (80 mg total) by mouth daily. 90 tablet 1   No facility-administered medications prior to visit.     Review of Systems;  Patient denies headache, fevers, malaise, unintentional weight loss, skin rash, eye pain, sinus congestion and sinus pain, sore throat, dysphagia,  hemoptysis , cough, dyspnea, wheezing, chest pain, palpitations, orthopnea, edema, abdominal pain, nausea, melena, diarrhea, constipation, flank pain, dysuria, hematuria, urinary  Frequency, nocturia, numbness, tingling, seizures,  Focal weakness, Loss of consciousness,  Tremor, insomnia, depression, anxiety, and suicidal ideation.      Objective:  There were no vitals taken for this visit.  BP Readings from Last 3 Encounters:  05/18/23 130/80  11/19/22 126/80  05/04/22 130/80    Wt Readings from Last 3 Encounters:  10/10/23 214 lb (97.1 kg)  05/18/23 229 lb 6.4 oz (104.1 kg)  11/18/22 219 lb (99.3 kg)    Physical Exam  Lab Results  Component Value Date   HGBA1C 5.8 11/18/2022   HGBA1C 5.8 05/04/2022   HGBA1C 5.7 06/18/2021    Lab Results  Component Value Date   CREATININE 1.07 05/18/2023   CREATININE 1.07 11/18/2022   CREATININE 1.18 05/12/2022    Lab Results  Component Value Date   WBC 7.3 11/18/2022   HGB 13.0 11/18/2022   HCT 39.8 11/18/2022   PLT 220.0 11/18/2022   GLUCOSE 101 (H) 05/18/2023  CHOL 176 05/18/2023   TRIG 62.0 05/18/2023   HDL 69.60 05/18/2023   LDLDIRECT 85.0 05/18/2023   LDLCALC 94 05/18/2023   ALT 19 05/18/2023   AST 17 05/18/2023   NA 136 05/18/2023   K 4.0 05/18/2023   CL 100 05/18/2023   CREATININE 1.07 05/18/2023   BUN 27 (H) 05/18/2023   CO2 27 05/18/2023   TSH 2.74 05/18/2023   INR 1.1 03/16/2020   HGBA1C 5.8 11/18/2022   MICROALBUR <0.2 12/08/2017    CT Angio Chest W/Cm &/Or Wo Cm Result Date: 01/20/2021 CLINICAL DATA:  History of pulmonary embolism treated with thrombectomy. Currently on anticoagulation. Evaluate for acute or chronic pulmonary embolism. EXAM: CT ANGIOGRAPHY CHEST WITH CONTRAST TECHNIQUE: Multidetector CT imaging of the chest was  performed using the standard protocol during bolus administration of intravenous contrast. Multiplanar CT image reconstructions and MIPs were obtained to evaluate the vascular anatomy. CONTRAST:  75mL OMNIPAQUE  IOHEXOL  350 MG/ML SOLN COMPARISON:  Chest CTA-03/16/2020 Thyroid  ultrasound-03/16/2020 FINDINGS: Vascular Findings: There is adequate opacification of the pulmonary arterial system with the main pulmonary artery measuring 300 Hounsfield units. There are no discrete filling defects within the pulmonary arterial tree to suggest acute or chronic pulmonary embolism. Normal caliber of the main pulmonary artery. Cardiomegaly. No pericardial effusion. Coronary artery calcifications. Scattered atherosclerotic plaque within a normal caliber thoracic aorta. Conventional configuration of the aortic arch. The descending thoracic aorta is mildly tortuous but of normal caliber. Review of the MIP images confirms the above findings. ---------------------------------------------------------------------------------- Nonvascular Findings: Mediastinum/Lymph Nodes: No bulky mediastinal, hilar or axillary lymphadenopathy. Lungs/Pleura: Minimal dependent subpleural ground-glass atelectasis. No discrete focal airspace opacities. No pleural effusion or pneumothorax. The central pulmonary airways appear widely patent. No discrete pulmonary nodules. Upper abdomen: Limited early arterial phase evaluation of the upper abdomen suggests a small hiatal hernia. Scattered colonic diverticulosis without evidence of superimposed acute diverticulitis. Previously noted trace perihepatic ascites and stranding at the level of the porta hepatis has apparently resolved in the interval. Musculoskeletal: No acute or aggressive osseous abnormalities. 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 Asymmetric enlargement of the left lobe of the thyroid  with scattered dystrophic calcifications and mass effect upon the tracheal air  column at the level of the thoracic inlet (representative image 18, series 4), similar to chest CT performed 03/16/2020 as well as further evaluated on dedicated thyroid  ultrasound also performed on 03/16/2020. IMPRESSION: 1. No evidence of acute or chronic pulmonary embolism. 2. Cardiomegaly. 3. Coronary artery calcifications. Aortic Atherosclerosis (ICD10-I70.0). 4. Redemonstrated asymmetric enlargement of the left lobe of the thyroid  as demonstrated on both previous chest CT and dedicated thyroid  ultrasound performed 03/16/2020. If recommended ultrasound-guided fine-needle aspiration is not been performed, repeat diagnostic thyroid  ultrasound is recommended. Electronically Signed   By: Norleen Roulette M.D.   On: 01/20/2021 09:37    Assessment & Plan:  .There are no diagnoses linked to this encounter.   I spent 34 minutes on the day of this face to face encounter reviewing patient's  most recent visit with cardiology,  nephrology,  and neurology,  prior relevant surgical and non surgical procedures, recent  labs and imaging studies, counseling on weight management,  reviewing the assessment and plan with patient, and post visit ordering and reviewing of  diagnostics and therapeutics with patient  .   Follow-up: No follow-ups on file.   Verneita LITTIE Kettering, MD

## 2023-11-23 NOTE — Patient Instructions (Signed)
 I AM SWITCHING YOU TO ZEPBOUND .  THE FIRST DOSE IS  SIMILAR TO YOUR CURRENT WEGOVY  DOSE AND IS 5 MG   WE CAN INCREASE YOUR DOSE WITH THE NEXT REFILL IF YOUR WEIGHT HAS NOT CHANGED.  THE NEXT DOSE IS 7.5 MG

## 2023-11-24 DIAGNOSIS — R3121 Asymptomatic microscopic hematuria: Secondary | ICD-10-CM | POA: Insufficient documentation

## 2023-11-24 NOTE — Assessment & Plan Note (Addendum)
 Noted by nephrologist during review of chart,  from March 2022 encounter with prior hematologist.  Repeat UA today is negative for hematuria but positive for calcium  oxalate crystals and trace proteinuria.  She has been referred to urology

## 2023-11-24 NOTE — Assessment & Plan Note (Signed)
 Reviewed Sept [facility] note:  Recommended treatment  ET with letrozole but truly very small benefit given 7 mm and mucinous. Suspect (patient) would do fine without anything. But if omitting RT and SLN, we should use ET

## 2023-11-24 NOTE — Assessment & Plan Note (Signed)
 She has resumed GLP 1 agonist  ;  refilling tirzepitide at 5 mg weekly

## 2023-11-24 NOTE — Progress Notes (Signed)
 Patient ID: Michele Garza, female    DOB: [birth year]  Age: 73 y.o. MRN: [id number]  The patient is here for annual preventive examination and management of other chronic and acute problems.   The risk factors are reflected in the social history.   The roster of all physicians providing medical care to patient - is listed in the Snapshot section of the chart.   Activities of daily living:  The patient is 100% independent in all ADLs: dressing, toileting, feeding as well as independent mobility   Home safety : The patient has smoke detectors in the home. They wear seatbelts.  There are no unsecured firearms at home. There is no violence in the home.    There is no risks for hepatitis, STDs or HIV. There is no   history of blood transfusion. They have no travel history to infectious disease endemic areas of the world.   The patient has seen their dentist in the last six month. They have seen their eye doctor in the last year. The patinet  denies slight hearing difficulty with regard to whispered voices and some television programs.  They have deferred audiologic testing in the last year.  They do not  have excessive sun exposure. Discussed the need for sun protection: hats, long sleeves and use of sunscreen if there is significant sun exposure.    Diet: the importance of a healthy diet is discussed. They do have a healthy diet.   The benefits of regular aerobic exercise were discussed. The patient  exercises  3 to 5 days per week  for  60 minutes.    Depression screen: there are no signs or vegative symptoms of depression- irritability, change in appetite, anhedonia, sadness/tearfullness.   The following portions of the patient's history were reviewed and updated as appropriate: allergies, current medications, past family history, past medical history,  past surgical history, past social history  and problem list.   Visual acuity was not assessed per patient preference since the patient has  regular follow up with an  ophthalmologist. Hearing and body mass index were assessed and reviewed.    During the course of the visit the patient was educated and counseled about appropriate screening and preventive services including : fall prevention , diabetes screening, nutrition counseling, colorectal cancer screening, and recommended immunizations.    Chief Complaint:   CKD:  SEEN BY [facility] ON OCT 30.  TELMISARTAN  DOSE REDUCED TO 40 MG DAILY DUE TO HYPOTENSION  BP 97/61.  Microscopic hematuria  noted on urinalysis ordered by Michele Garza during March 24 2020 office visit)  was  noted by nephrologist,  has referred her to urology   Hypertension: patient checks blood pressure twice weekly at home.  Readings have been for the most part <130/80 at rest . Patient is following a reduced salt diet most days and is taking telmisartan  and metoprolol    as prescribed   BRCA:   s/p lumpectomy in August .  Given the choice of treatment, opted for XRT /clinical study rather than Femara  due to the side effects she anticipated .  Had some hair loss after surgery       Review of Symptoms  Patient denies headache, fevers, malaise, unintentional weight loss, skin rash, eye pain, sinus congestion and sinus pain, sore throat, dysphagia,  hemoptysis , cough, dyspnea, wheezing, chest pain, palpitations, orthopnea, edema, abdominal pain, nausea, melena, diarrhea, constipation, flank pain, dysuria, hematuria, urinary  Frequency, nocturia, numbness, tingling, seizures,  Focal weakness, Loss  of consciousness,  Tremor, insomnia, depression, anxiety, and suicidal ideation.    Physical Exam:  BP 124/78   Pulse 88   Ht 5' 3 (1.6 m)   Wt 210 lb 12.8 oz (95.6 kg)   SpO2 97%   BMI 37.34 kg/m    Physical Exam Vitals reviewed.  Constitutional:      General: She is not in acute distress.    Appearance: Normal appearance. She is normal weight. She is not ill-appearing, toxic-appearing or diaphoretic.   HENT:     Head: Normocephalic.  Eyes:     General: No scleral icterus.       Right eye: No discharge.        Left eye: No discharge.     Conjunctiva/sclera: Conjunctivae normal.  Cardiovascular:     Rate and Rhythm: Normal rate and regular rhythm.     Heart sounds: Normal heart sounds.  Pulmonary:     Effort: Pulmonary effort is normal. No respiratory distress.     Breath sounds: Normal breath sounds.  Musculoskeletal:        General: Normal range of motion.  Skin:    General: Skin is warm and dry.  Neurological:     General: No focal deficit present.     Mental Status: She is alert and oriented to person, place, and time. Mental status is at baseline.  Psychiatric:        Mood and Affect: Mood normal.        Behavior: Behavior normal.        Thought Content: Thought content normal.        Judgment: Judgment normal.     Assessment and Plan: Encounter for preventive health examination Assessment & Plan: age appropriate education and counseling updated, referrals for preventative services and immunizations addressed, dietary and smoking counseling addressed, most recent labs reviewed.  I have personally reviewed and have noted:   1) the patient's medical and social history 2) The pt's use of alcohol, tobacco, and illicit drugs 3) The patient's current medications and supplements 4) Functional ability including ADL's, fall risk, home safety risk, hearing and visual impairment 5) Diet and physical activities 6) Evidence for depression or mood disorder 7) The patient's height, weight, and BMI have been recorded in the chart    I have made referrals, and provided counseling and education based on review of the above    White coat syndrome with diagnosis of hypertension Assessment & Plan: Home readings have been reviewed and are 130/80 or less on telmisartan  80 mg and metoprolol  50 mg daily.  Encouraged to continue checking BP at home   Orders: -     Comprehensive  metabolic panel with GFR  Pure hypercholesterolemia Assessment & Plan: She is tolerating daily dose of lipitor 20 mg without side effects.   LDL is at goal.  Lab Results  Component Value Date   CHOL 171 11/23/2023   HDL 58.60 11/23/2023   LDLCALC 98 11/23/2023   LDLDIRECT 100.0 11/23/2023   TRIG 73.0 11/23/2023   CHOLHDL 3 11/23/2023   Lab Results  Component Value Date   ALT 18 11/23/2023   AST 20 11/23/2023   ALKPHOS 51 11/23/2023   BILITOT 0.7 11/23/2023      Orders: -     Lipid panel -     LDL cholesterol, direct  Impaired fasting glucose -     Comprehensive metabolic panel with GFR -     Hemoglobin A1c  Long-term use of high-risk medication -  CBC with Differential/Platelet -     TSH  Malignant neoplasm of left female breast, unspecified estrogen receptor status, unspecified site of breast [facility]) Assessment & Plan: Reviewed Sept [facility] note:  Recommended treatment  ET with letrozole but truly very small benefit given 7 mm and mucinous. Suspect (patient) would do fine without anything. But if omitting RT and SLN, we should use ET    Asymptomatic microscopic hematuria Assessment & Plan: Noted by nephrologist during review of chart,  from March 2022 encounter with prior hematologist.  Repeat UA today is negative for hematuria but positive for calcium  oxalate crystals and trace proteinuria.  She has been referred to urology      Orders: -     Urinalysis, Routine w reflex microscopic  Need for influenza vaccination -     Flu vaccine HIGH DOSE PF(Fluzone Trivalent)  Family history of colon cancer Assessment & Plan: Next colonoscopy is due in 2027   Morbid obesity with BMI of 40.0-44.9, adult [facility]) Assessment & Plan: She has resumed GLP 1 agonist  ;  refilling tirzepitide at 5 mg weekly    Aortic atherosclerosis Assessment & Plan: SABRASABRAReviewed reason for statin .   Patient is tolerating high potency statin therapy with atorvastatin   20 mg daiy     Other orders -     Zepbound; Inject 5 mg into the skin once a week.  Dispense: 2 mL; Refill: 1 -     Tetanus-Diphth-Acell Pertussis; Inject 0.5 mLs into the muscle once for 1 dose.  Dispense: 0.5 mL; Refill: 0    Return in about 6 months (around 05/22/2024).  Verneita LITTIE Kettering, MD

## 2023-11-24 NOTE — Assessment & Plan Note (Signed)
Next colonoscopy is due in 2027

## 2023-11-24 NOTE — Assessment & Plan Note (Signed)
 Home readings have been reviewed and are 130/80 or less on telmisartan  80 mg and metoprolol  50 mg daily.  Encouraged to continue checking BP at home

## 2023-11-24 NOTE — Assessment & Plan Note (Addendum)
 She is tolerating daily dose of lipitor 20 mg without side effects.   LDL is at goal.  Lab Results  Component Value Date   CHOL 171 11/23/2023   HDL 58.60 11/23/2023   LDLCALC 98 11/23/2023   LDLDIRECT 100.0 11/23/2023   TRIG 73.0 11/23/2023   CHOLHDL 3 11/23/2023   Lab Results  Component Value Date   ALT 18 11/23/2023   AST 20 11/23/2023   ALKPHOS 51 11/23/2023   BILITOT 0.7 11/23/2023

## 2023-11-24 NOTE — Assessment & Plan Note (Signed)
..  Reviewed reason for statin .   Patient is tolerating high potency statin therapy with atorvastatin  20 mg daiy

## 2023-11-24 NOTE — Assessment & Plan Note (Signed)

## 2023-11-25 ENCOUNTER — Other Ambulatory Visit: Payer: Self-pay | Admitting: Internal Medicine

## 2023-11-26 ENCOUNTER — Ambulatory Visit: Payer: Self-pay | Admitting: Internal Medicine

## 2023-11-26 DIAGNOSIS — R3121 Asymptomatic microscopic hematuria: Secondary | ICD-10-CM

## 2023-11-26 NOTE — Assessment & Plan Note (Signed)
 Noted by nephrologist during review of chart,  from March 2022 encounter with prior hematologist.  Repeat UA today is negative for hematuria but positive for calcium  oxalate crystals and trace proteinuria.  She has been referred to urology

## 2023-11-29 ENCOUNTER — Encounter: Payer: Self-pay | Admitting: Internal Medicine

## 2023-11-30 NOTE — Telephone Encounter (Signed)
 Noted

## 2023-12-05 ENCOUNTER — Other Ambulatory Visit (HOSPITAL_COMMUNITY): Payer: Self-pay

## 2023-12-05 ENCOUNTER — Telehealth: Payer: Self-pay

## 2023-12-05 NOTE — Telephone Encounter (Signed)
 Pharmacy Patient Advocate Encounter   Received notification from Onbase that prior authorization for [facility]) 5 MG/0.5ML Pen  is required/requested.   Insurance verification completed.   The patient is insured through [employer].   PER TEST CLAIM: PA required; PA submitted to above mentioned insurance via Latent Key/confirmation #/EOC [id number] Status is pending

## 2023-12-07 ENCOUNTER — Other Ambulatory Visit (HOSPITAL_COMMUNITY): Payer: Self-pay

## 2023-12-07 ENCOUNTER — Other Ambulatory Visit: Payer: Self-pay | Admitting: Internal Medicine

## 2023-12-07 ENCOUNTER — Encounter: Payer: Self-pay | Admitting: Internal Medicine

## 2023-12-07 NOTE — Telephone Encounter (Signed)
 Pharmacy Patient Advocate Encounter  Received notification from [employer] that Prior Authorization for Zepbound 5MG /0.5ML pen-injectors  has been DENIED.  See denial reason below. No denial letter attached in CMM. Will attach denial letter to Media tab once received.  We did not find support for use, in your situation. This drug is to be used to help lose weight. You also have a diagnosis of Morbid (severe) obesity due to excess calories. Medicare says that drugs used for weight loss are excluded from Part D coverage. Medicare did release a memo on April 06, 2022. This stated that anti-obesity medications remain excluded from Medicare Part D coverage. But, if that same drug received approval from the FDA for an additional medically accepted indication, then it could be considered a Part D drug- for that specific use only. This is not the case for your diagnosis. Therefore, we are unable to approve coverage under your Part D benefit. Please note: Zepbound is FDA approved to treat moderate to severe obstructive sleep apnea (OSA) in adults with obesity. Learn more in your Evidence of Coverage booklet. Look under- &apos;What types of drugs are not covered by the plan&apos;. Also see the Prescription Drug Benefit Manual, Chapter 6 Section 20.1. You can also reference the Medicare Memo titled Part D Coverage of Anti-Obesity Medications with Medically Accepted Indications online.   PA #/Case ID/Reference #: [id number]

## 2023-12-21 DIAGNOSIS — R202 Paresthesia of skin: Secondary | ICD-10-CM | POA: Diagnosis not present

## 2023-12-21 DIAGNOSIS — M4317 Spondylolisthesis, lumbosacral region: Secondary | ICD-10-CM | POA: Diagnosis not present

## 2023-12-21 DIAGNOSIS — M5416 Radiculopathy, lumbar region: Secondary | ICD-10-CM | POA: Diagnosis not present

## 2023-12-21 DIAGNOSIS — M5116 Intervertebral disc disorders with radiculopathy, lumbar region: Secondary | ICD-10-CM | POA: Diagnosis not present

## 2024-05-23 ENCOUNTER — Ambulatory Visit: Admitting: Internal Medicine

## 2024-10-14 ENCOUNTER — Ambulatory Visit
